# Patient Record
Sex: Female | Born: 1945 | Race: White | Hispanic: No | Marital: Married | Smoking: Never smoker
Health system: Southern US, Community
[De-identification: ages and names within clinical notes are randomized; demographics above are authoritative.]

## PROBLEM LIST (undated history)

## (undated) DIAGNOSIS — E119 Type 2 diabetes mellitus without complications: Secondary | ICD-10-CM

## (undated) DIAGNOSIS — M1990 Unspecified osteoarthritis, unspecified site: Secondary | ICD-10-CM

## (undated) DIAGNOSIS — F329 Major depressive disorder, single episode, unspecified: Secondary | ICD-10-CM

## (undated) DIAGNOSIS — R5383 Other fatigue: Secondary | ICD-10-CM

## (undated) DIAGNOSIS — M549 Dorsalgia, unspecified: Secondary | ICD-10-CM

## (undated) DIAGNOSIS — R079 Chest pain, unspecified: Secondary | ICD-10-CM

## (undated) DIAGNOSIS — D649 Anemia, unspecified: Secondary | ICD-10-CM

## (undated) DIAGNOSIS — R011 Cardiac murmur, unspecified: Secondary | ICD-10-CM

## (undated) DIAGNOSIS — E039 Hypothyroidism, unspecified: Secondary | ICD-10-CM

## (undated) DIAGNOSIS — R7303 Prediabetes: Secondary | ICD-10-CM

## (undated) DIAGNOSIS — K900 Celiac disease: Secondary | ICD-10-CM

## (undated) DIAGNOSIS — J189 Pneumonia, unspecified organism: Secondary | ICD-10-CM

## (undated) DIAGNOSIS — F32A Depression, unspecified: Secondary | ICD-10-CM

## (undated) DIAGNOSIS — K219 Gastro-esophageal reflux disease without esophagitis: Secondary | ICD-10-CM

## (undated) DIAGNOSIS — G2581 Restless legs syndrome: Secondary | ICD-10-CM

## (undated) DIAGNOSIS — I341 Nonrheumatic mitral (valve) prolapse: Secondary | ICD-10-CM

## (undated) DIAGNOSIS — I480 Paroxysmal atrial fibrillation: Secondary | ICD-10-CM

## (undated) HISTORY — PX: FRACTURE SURGERY: SHX138

## (undated) HISTORY — DX: Anemia, unspecified: D64.9

## (undated) HISTORY — PX: EYE SURGERY: SHX253

## (undated) HISTORY — DX: Unspecified osteoarthritis, unspecified site: M19.90

## (undated) HISTORY — PX: BACK SURGERY: SHX140

## (undated) HISTORY — PX: ABDOMINAL HYSTERECTOMY: SHX81

## (undated) HISTORY — DX: Cardiac murmur, unspecified: R01.1

## (undated) HISTORY — DX: Dorsalgia, unspecified: M54.9

## (undated) HISTORY — PX: CATARACT EXTRACTION W/ INTRAOCULAR LENS  IMPLANT, BILATERAL: SHX1307

## (undated) HISTORY — PX: NECK SURGERY: SHX720

---

## 2004-05-01 ENCOUNTER — Ambulatory Visit: Payer: Self-pay | Admitting: Internal Medicine

## 2005-02-05 ENCOUNTER — Ambulatory Visit: Payer: Self-pay | Admitting: Internal Medicine

## 2005-05-20 ENCOUNTER — Ambulatory Visit: Payer: Self-pay | Admitting: Ophthalmology

## 2005-05-21 ENCOUNTER — Ambulatory Visit: Payer: Self-pay | Admitting: Internal Medicine

## 2005-06-22 HISTORY — PX: FOOT SURGERY: SHX648

## 2005-10-07 ENCOUNTER — Ambulatory Visit: Payer: Self-pay | Admitting: Pain Medicine

## 2005-10-21 ENCOUNTER — Ambulatory Visit: Payer: Self-pay | Admitting: Pain Medicine

## 2005-10-22 ENCOUNTER — Ambulatory Visit: Payer: Self-pay | Admitting: Pain Medicine

## 2005-11-09 ENCOUNTER — Ambulatory Visit: Payer: Self-pay | Admitting: Physician Assistant

## 2006-03-10 ENCOUNTER — Ambulatory Visit: Payer: Self-pay | Admitting: Pain Medicine

## 2006-03-11 ENCOUNTER — Ambulatory Visit: Payer: Self-pay | Admitting: Pain Medicine

## 2006-03-15 ENCOUNTER — Ambulatory Visit: Payer: Self-pay | Admitting: Pain Medicine

## 2006-04-07 ENCOUNTER — Ambulatory Visit: Payer: Self-pay | Admitting: Pain Medicine

## 2006-04-08 ENCOUNTER — Ambulatory Visit: Payer: Self-pay | Admitting: Pain Medicine

## 2006-04-21 ENCOUNTER — Ambulatory Visit: Payer: Self-pay | Admitting: Internal Medicine

## 2006-04-22 ENCOUNTER — Ambulatory Visit: Payer: Self-pay | Admitting: Pain Medicine

## 2006-05-07 ENCOUNTER — Ambulatory Visit: Payer: Self-pay | Admitting: Physician Assistant

## 2006-05-24 ENCOUNTER — Ambulatory Visit: Payer: Self-pay | Admitting: Internal Medicine

## 2006-06-03 ENCOUNTER — Inpatient Hospital Stay (HOSPITAL_COMMUNITY): Admission: RE | Admit: 2006-06-03 | Discharge: 2006-06-04 | Payer: Self-pay | Admitting: Neurosurgery

## 2006-08-24 ENCOUNTER — Emergency Department: Payer: Self-pay | Admitting: Emergency Medicine

## 2007-06-01 ENCOUNTER — Ambulatory Visit: Payer: Self-pay | Admitting: Internal Medicine

## 2008-05-04 ENCOUNTER — Ambulatory Visit: Payer: Self-pay | Admitting: Gastroenterology

## 2008-05-16 ENCOUNTER — Ambulatory Visit: Payer: Self-pay | Admitting: Gastroenterology

## 2008-06-04 ENCOUNTER — Ambulatory Visit: Payer: Self-pay | Admitting: Internal Medicine

## 2008-09-13 ENCOUNTER — Ambulatory Visit: Payer: Self-pay | Admitting: Orthopedic Surgery

## 2008-11-22 ENCOUNTER — Inpatient Hospital Stay (HOSPITAL_COMMUNITY): Admission: RE | Admit: 2008-11-22 | Discharge: 2008-11-26 | Payer: Self-pay | Admitting: Neurosurgery

## 2009-06-05 ENCOUNTER — Ambulatory Visit: Payer: Self-pay | Admitting: Internal Medicine

## 2009-09-27 ENCOUNTER — Emergency Department: Payer: Self-pay | Admitting: Emergency Medicine

## 2010-06-09 ENCOUNTER — Ambulatory Visit: Payer: Self-pay | Admitting: Internal Medicine

## 2010-07-18 ENCOUNTER — Ambulatory Visit: Payer: Self-pay | Admitting: Gastroenterology

## 2010-07-22 LAB — PATHOLOGY REPORT

## 2010-08-08 ENCOUNTER — Ambulatory Visit: Payer: Self-pay | Admitting: Internal Medicine

## 2010-08-19 ENCOUNTER — Ambulatory Visit: Payer: Self-pay | Admitting: Internal Medicine

## 2010-08-21 ENCOUNTER — Ambulatory Visit: Payer: Self-pay | Admitting: Internal Medicine

## 2010-09-21 ENCOUNTER — Ambulatory Visit: Payer: Self-pay | Admitting: Internal Medicine

## 2010-09-29 LAB — BASIC METABOLIC PANEL
CO2: 29 mEq/L (ref 19–32)
Chloride: 97 mEq/L (ref 96–112)
Glucose, Bld: 145 mg/dL — ABNORMAL HIGH (ref 70–99)
Potassium: 3.6 mEq/L (ref 3.5–5.1)
Sodium: 133 mEq/L — ABNORMAL LOW (ref 135–145)

## 2010-09-29 LAB — CBC
Hemoglobin: 9.1 g/dL — ABNORMAL LOW (ref 12.0–15.0)
MCHC: 34.3 g/dL (ref 30.0–36.0)
MCV: 89 fL (ref 78.0–100.0)
Platelets: 313 10*3/uL (ref 150–400)
RDW: 13.3 % (ref 11.5–15.5)
RDW: 13.4 % (ref 11.5–15.5)
WBC: 7.1 10*3/uL (ref 4.0–10.5)

## 2010-09-29 LAB — DIFFERENTIAL
Lymphs Abs: 2.9 10*3/uL (ref 0.7–4.0)
Monocytes Relative: 6 % (ref 3–12)
Neutro Abs: 4.8 10*3/uL (ref 1.7–7.7)
Neutrophils Relative %: 56 % (ref 43–77)

## 2010-09-29 LAB — CULTURE, BLOOD (ROUTINE X 2): Culture: NO GROWTH

## 2010-09-30 LAB — BASIC METABOLIC PANEL
BUN: 8 mg/dL (ref 6–23)
Calcium: 8.9 mg/dL (ref 8.4–10.5)
Creatinine, Ser: 0.69 mg/dL (ref 0.4–1.2)
GFR calc non Af Amer: >60 mL/min (ref >60)
Glucose, Bld: 101 mg/dL — ABNORMAL HIGH (ref 70–99)

## 2010-09-30 LAB — TYPE AND SCREEN
ABO/RH(D): A NEG
Antibody Screen: NEGATIVE

## 2010-09-30 LAB — ABO/RH: ABO/RH(D): A NEG

## 2010-09-30 LAB — CBC
RBC: 4.15 MIL/uL (ref 3.87–5.11)
WBC: 5.9 10*3/uL (ref 4.0–10.5)

## 2010-11-04 NOTE — Op Note (Signed)
NAMEAllyne Duran NO.:  [id number]   MEDICAL RECORD NO.:  [id number]          PATIENT TYPE:  INP   LOCATION:  3020                         FACILITY:  [facility]   PHYSICIAN:  Ophelia Charter, M.D.DATE OF BIRTH:  1946-01-30   DATE OF PROCEDURE:  11/22/2008  DATE OF DISCHARGE:                               OPERATIVE REPORT   BRIEF HISTORY:  The patient is a 65 year old white female who has  suffered from back and leg pain consistent with neurogenic claudication.  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  I discussed various treatments with the patient  including surgery.  The patient has weighed the risks, benefits, and  alternatives of surgery, and decided to proceed with L3-4 and L4-5  decompression, instrumentation, and fusion.   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   PROCEDURE:  Bilateral L3 and L4 laminotomies, foraminotomies to  decompress the bilateral L3, L4, and L5 nerve roots (this work was an  addition to work, required to do the posterior lumbar fusion); L3-4 and  L4-5 posterior lumbar interbody fusion with local morselized autograft  bone and Actifuse bone graft extender; insertion of L3-4 and L4-5  interbody prosthesis (Capstone PEEK interbody prosthesis); L3-L5  posterior segmental instrumentation with legacy titanium pedicle screws,  rods; bilateral L3-4 and L4-5 posterolateral arthrodesis with local  morselized autograft bone, Actifuse bone graft extender, and Vitoss bone  graft extender.   SURGEON:  Ophelia Charter, MD   ASSISTANT:  None.   ANESTHESIA:  General endotracheal.   ESTIMATED BLOOD LOSS:  250 mL.   SPECIMENS:  None.   DRAINS:  None.   COMPLICATIONS:  None.   DESCRIPTION OF PROCEDURE:  The patient was brought to the operating room  by anesthesia team.  General endotracheal anesthesia was induced.  The  patient was turned to the prone position on Wilson frame.  The  lumbosacral region was then prepared with Betadine scrub with Betadine  solution.  Sterile drapes were applied.  I then injected the area to be  incised with Marcaine with epinephrine solution.  I used a scalpel to  make a linear midline incision over the L3-4 and L4-5 interspaces.  I  used electrocautery to perform a bilateral subperiosteal dissection  exposing the spinous processes and lamina from L2 down to the upper  sacrum.  We obtained intraoperative radiographs to confirm our location  and inserted the Versa-Trac retractor for exposure.   We began with the decompression because of the patient's spinal  stenosis, facet arthropathy, foraminal stenosis, etc.  A wide lateral  decompression was required to decompress the bilateral L3, L4, and L5  nerve roots.  This work was an addition to the work retracted to  posterior lumbar interbody fusion.  We used a high-speed drill to  perform bilaterally for L3 and L4 laminotomies.  We removed the inferior  facet of L3 and L4.  We performed wide foraminotomies about the  bilateral L3, L4, and L5 nerve roots decompressing the thecal sac as  well.  We only removed the excess ligamentum flavum and got a good  decompression.   We now turned our attention to the posterior lumbar interbody fusion.  We incised the L3-4 and L4-5 intervertebral disk with 15-blade scalpel  bilaterally.  We performed a partial intervertebral diskectomy.  We  prepared the vertebral endplates by using curettes to clear soft tissue  from the vertebral endplates.  We then used a trial spacers and I  determined to use a 12 x 26-mm Capstone PEEK interbody prosthesis.  We  prefilled this prosthesis with combination of local  morselized autograft  bone.  We obtained decompression as well as Actifuse bone graft  extender.  We inserted prosthesis into the L3-4 and L4-5 interspaces  bilaterally of course after retracting the neural structures out of  harm's way.  We got a good snug fit.  We filled the remainder of this  disk space with local autograft bone and Actifuse bone graft extender  completing the posterior lumbar interbody fusion.   We now turned our attention to the posterior spinal instrumentation  under fluoroscopic guidance.  We cannulated the bilateral L3, L4, and L5  pedicles with the bone probe.  We tapped pedicles with 5.5-mm tap.  We  probed inside the tapped pedicles to rule out cortical breaches.  We  then inserted 6.5 x 50-mm pedicle screws bilaterally at L3, L4, and L5,  we got a good bony purchase.  We then palpated along the medial aspect  of the L3, L4, and L5 pedicles and did not note any cortical breeches.  We then connected the unilateral pedicle screws with lordotic rod, we  compressed to construct, and then secured the rod in place with caps,  which we tightened appropriately completing the instrumentation.   We now turned our attention to posterolateral arthrodesis.  We used a  high-speed drill to decorticate the remainder of L3-4 and L4-L5 facets,  transverse process, pars region, etc.  We then laid a combination of  localized autograft bone and Actifuse bone graft extender, and Vitoss  bone graft extender over these decorticated posterolateral structures  completing the posterolateral arthrodesis at L3-4 and L4-5.   We then obtained hemostasis using bipolar electrocautery.  We irrigated  the wound out with bacitracin solution.  We inspected the thecal sac and  bilateral L3, L4, and L5 nerve roots and noted that they are well  decompressed.  We then removed the retractors and then reapproximated  the patient's thoracolumbar fascia with interrupted #1 Vicryl suture,  the  subcutaneous tissue with interrupted 2-0 Vicryl suture and the skin  with Steri-Strips and Benzoin.  The wound was then coated with  bacitracin ointment.  A sterile dressing was applied.  The drapes were  removed.  The patient was subsequently turned to supine position where  she was extubated by the anesthesia team and transported to the  postanesthesia care unit in stable condition.  All sponge, instrument,  and needle counts were correct at the end of the case.      Ophelia Charter, M.D.  Electronically Signed     JDJ/MEDQ  D:  11/22/2008  T:  11/23/2008  Job:  [id number]

## 2010-11-04 NOTE — Discharge Summary (Signed)
NAMEAllyne Duran NO.:  [id number]   MEDICAL RECORD NO.:  [id number]          PATIENT TYPE:  INP   LOCATION:  3020                         FACILITY:  [facility]   PHYSICIAN:  Ophelia Charter, M.D.DATE OF BIRTH:  [birth year]   DATE OF ADMISSION:  11/22/2008  DATE OF DISCHARGE:  11/26/2008                               DISCHARGE SUMMARY   BRIEF HISTORY:  The patient is a 65 year old white female who has  suffered from back and leg pain consistent with neurogenic claudication.  She was worked up with lumbar MRI which demonstrated the patient had  disk degeneration and facet arthropathy, spondylolisthesis, etc., at  L3-  4 and L4-5.  I discussed the various treatment options with the patient  including surgery.  She has weighed the risks, benefits, and  alternatives of surgery but elected to proceed with L3-4 and L4-5  decompression, instrumentation, and fusion.   For further details of this admission, please refer to typed history and  physical.   HOSPITAL COURSE:  I admitted the patient to [facility] on November 22, 2008.  On the day of admission, I performed L3-4 and L4-5  decompression, instrumentation, and fusion.  Surgery went well (for full  details of this operation, please refer to typed operative note).   POSTOPERATIVE COURSE:  The patient's postop course was remarkable only  for low-grade fever.  We worked this up with a chest x-ray.  Chest x-ray  and CBC both of which turned out normal.   She had a white count.  She had no more fevers and was discharged home  on November 26, 2008.   DISCHARGE INSTRUCTIONS:  The patient was given written discharge  instructions  and told to follow up with me in 4 weeks.   DISCHARGE PRESCRIPTIONS:  1. Percocet 10/325, #100 one p.o. q.4 h p.r.n. for pain.  2. Valium 5 mg #50 one p.o. q.6 h p.r.n. for muscle spasms.   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   POSTOPERATIVE DIAGNOSES:  L3-4 and L4-5 spondylolisthesis, disk  degeneration and stenosis, lumbar radiculopathy, and lumbago.   PROCEDURE:  L3 and L4 decompressive laminectomies to decompress  bilateral L3, 4, and 5 nerve roots; L3-4 and L4-5 posterior lumbar  interbody fusion with local morselized autograft bone and Actifuse bone  graft extender; L3-4  and L4-5 insertion of interbody prosthesis; L3 to L5 posterior segmental  instrumentation with Legacy titanium pedicle screws and rods; L3-4 and  L4-5 posterolateral arthrodesis with local morselized autograft bone and  Actifuse/Vitoss bone graft extenders.      Ophelia Charter, M.D.  Electronically Signed     JDJ/MEDQ  D:  11/26/2008  T:  11/27/2008  Job:  [id number]

## 2010-11-07 NOTE — Op Note (Signed)
NAMESelena Lesser                 ACCOUNT NO.:  [id number]   MEDICAL RECORD NO.:  [id number]          PATIENT TYPE:  INP   LOCATION:  3014                         FACILITY:  [facility]   PHYSICIAN:  Ophelia Charter, M.D.DATE OF BIRTH:  01/16/1946   DATE OF PROCEDURE:  06/03/2006  DATE OF DISCHARGE:                               OPERATIVE REPORT   PREOPERATIVE DIAGNOSES:  C5-6 herniated nucleus pulposus, C6-7  spondylosis, stenosis, cervicalgia, cervical radiculopathy.   POSTOPERATIVE DIAGNOSES:  C5-6 herniated nucleus pulposus, C6-7  spondylosis, stenosis, cervicalgia, cervical radiculopathy.   PROCEDURES:  C5-6 and C6-7 extensive anterior cervical diskectomy,  decompression, interbody autograft arthrodesis, insertion of C5-6 and C6-  7 interbody prosthesis (Capstone PEEK interbody spacers), anterior  cervical instrumentation, C5 to C7, with Codman Slimlock titanium plate  and screws.   SURGEON:  Ophelia Charter, M.D.   ASSISTANT:  Leeroy Cha, M.D.   ANESTHESIA:  General endotracheal.   ESTIMATED BLOOD LOSS:  100 cc.   SPECIMENS:  None.   DRAINS:  None.   COMPLICATIONS:  None.   BRIEF HISTORY:  The patient is a 65 year old white female, who has  suffered from neck and right arm pain.  She failed medical management  and was worked up with a cervical MRI, which demonstrated that she had  significant spondylosis and stenosis at C6-7, and a herniated disk at C5-  6.  I discussed the various treatment options with the patient,  including surgery.  She has weighed the risks, benefits and alternatives  of surgery and decided to proceed with a C5-6 and C6-7 anterior cervical  diskectomy and fusion with plating.   DESCRIPTION OF PROCEDURES:  The patient was brought to the operating  room by the anesthesia team.  General endotracheal anesthesia was  induced.  The patient remained in supine position.  A roll was placed  under her shoulders to place her neck in slight  extension.  Her anterior  cervical region was then prepared with Betadine Scrub and Betadine  Solution.  Sterile drapes were applied.  I then injected in the area to  be incised with Marcaine with epinephrine solution, and used a scalpel  to make a transverse incision in the patient's left anterior neck.  I  used the Metzenbaum scissors to divide the platysma muscle, and then to  dissect medial to the sternocleidomastoid, jugular vein and carotid  artery.  We carefully dissected down towards the anterior cervical  spine, carefully identifying the esophagus and retracting it medially.  We used the Kitner swabs to clear the soft tissue from the anterior  cervical spine, and inserted a bent spinal needle in to help expose the  intervertebral disk space.  We then obtained intraoperative radiograph  to confirm our location.   We then used elected to detach the medial border of the longus colli  muscle bilaterally from the C5-6 and C6-7 intervertebral disk space.  We  inserted a Caspar self-retaining retractor for exposure.  We began at C6-  7.  We incised the C6-7 intervertebral disk.  The disk space was quite  collapsed and  quite spondylotic.  We then inserted distraction screws at  C6 and C7, distracted the interspace, and then used a high-speed drill  to decorticate the vertebral endplates at U7-6, to drill away the  remaining C6-7 intervertebral disk, to drill away some posterior  spondylosis and to thin down the posterior longitudinal ligament.  We  then incised the ligament with an arachnoid knife and removed it with a  Kerrison punch, undercutting the vertebral endplates at L4-6,  decompressing the thecal sac.  We then performed foraminotomies about  the bilateral C7 nerve roots, completing the decompression at this  level.  There was considerable spondylosis at this level.   We then repeated this procedure in an analogous fashion at C5-6,  decompressing the thecal sac and the  bilateral C6 nerve root.  Of note,  at this level, we found a large herniated disk on the right.   Having completed the decompression at C5-6 and C6-7, we now turned  attention to arthrodesis.  We used trial spacers to determine the size  of the interbody prosthesis.  We used a 5-mm, small Alphatec PEEK spacer  at C6-7, and a 6-mm PEEK spacer at C5-6.  We filled these spacers with a  combination of local autograft bone that we obtained during the  decompression, as well as bone graft extender.  We then inserted the  prosthesis into the distract the C5-6 and C6-7 interspaces, and after  removing the distraction screws, there was a good snug fit of the  prostheses at both levels.   We now turned our attention to the anterior spinal instrumentation.  We  used a high-speed drill to remove some ventral spondylosis from the  vertebral endplates at T0-3 and T4-6 so that the plate would lie down  flat.  We then selected the appropriate length Codman Slimlock anterior  cervical plate and laid it along the anterior aspect of the vertebral  bodies from C5 to C7.  We drilled two 12-mm holes at C5, 6 and 7, and  then we secured the plate to the vertebral bodies by placing two 12-mm  self-tapping screws at C5, 6 and 7.  We then obtained intraoperative  radiograph that demonstrated good position of plate, screws and  prosthesis from C5 to C7.  We, therefore, secured these screws to the  plate by locking each cam.  We then obtained hemostasis using bipolar  cautery.  We irrigated the wound out with Bacitracin solution and  removed the McCullough retractor.  We then inspected the esophagus for  any damage and there was none apparent.  We then reapproximated the  patient's platysma muscle with interrupted 3-0 Vicryl suture, the  subcutaneous tissue with interrupted 3-0 Vicryl suture, and the skin  with Steri-Strips and benzoin.  The wound was then coated with Bacitracin ointment and a sterile dressing  applied.  The drapes were  removed, and the patient was subsequently extubated by the anesthesia  team and transported to the postanesthesia care unit in stable  condition.  All sponge, instrument and needle counts were correct at the  end of this case.      Ophelia Charter, M.D.  Electronically Signed     JDJ/MEDQ  D:  06/03/2006  T:  06/04/2006  Job:  [id number]   cc:   Leeroy Cha, M.D.

## 2010-11-10 ENCOUNTER — Ambulatory Visit: Payer: Self-pay | Admitting: Neurosurgery

## 2011-03-30 ENCOUNTER — Other Ambulatory Visit (HOSPITAL_COMMUNITY): Payer: Self-pay | Admitting: Neurosurgery

## 2011-03-30 DIAGNOSIS — M5136 Other intervertebral disc degeneration, lumbar region: Secondary | ICD-10-CM

## 2011-05-01 ENCOUNTER — Ambulatory Visit (HOSPITAL_COMMUNITY)
Admission: RE | Admit: 2011-05-01 | Discharge: 2011-05-01 | Disposition: A | Discharge status: Home / Self Care | Payer: Medicare Other | Source: Ambulatory Visit | Attending: Neurosurgery | Admitting: Neurosurgery

## 2011-05-01 ENCOUNTER — Other Ambulatory Visit (HOSPITAL_COMMUNITY): Payer: Self-pay | Admitting: Radiology

## 2011-05-01 DIAGNOSIS — M519 Unspecified thoracic, thoracolumbar and lumbosacral intervertebral disc disorder: Secondary | ICD-10-CM | POA: Insufficient documentation

## 2011-05-01 DIAGNOSIS — M5136 Other intervertebral disc degeneration, lumbar region: Secondary | ICD-10-CM

## 2011-05-01 DIAGNOSIS — Z981 Arthrodesis status: Secondary | ICD-10-CM | POA: Insufficient documentation

## 2011-05-01 DIAGNOSIS — M545 Low back pain, unspecified: Secondary | ICD-10-CM | POA: Insufficient documentation

## 2011-05-01 DIAGNOSIS — M48061 Spinal stenosis, lumbar region without neurogenic claudication: Secondary | ICD-10-CM | POA: Insufficient documentation

## 2011-05-01 MED ORDER — OXYCODONE-ACETAMINOPHEN 5-325 MG PO TABS
1.0000 | ORAL_TABLET | ORAL | Status: DC | PRN
  Administered 2011-05-01: 2 via ORAL

## 2011-05-01 MED ORDER — MORPHINE SULFATE 4 MG/ML IJ SOLN: 4.0000 mg | INTRAMUSCULAR | Status: DC | PRN

## 2011-05-01 MED ORDER — ONDANSETRON HCL 4 MG/2ML IJ SOLN: 4.0000 mg | Freq: Four times a day (QID) | INTRAMUSCULAR | Status: DC | PRN

## 2011-05-01 MED ORDER — IOHEXOL 180 MG/ML  SOLN
20.0000 mL | Freq: Once | INTRAMUSCULAR | Status: AC | PRN
  Administered 2011-05-01: 20 mL via INTRAVENOUS

## 2011-05-01 MED ORDER — OXYCODONE-ACETAMINOPHEN 5-325 MG PO TABS
ORAL_TABLET | ORAL | Status: AC
  Administered 2011-05-01: 2 via ORAL
  Filled 2011-05-01: qty 2

## 2011-05-01 MED ORDER — DIAZEPAM 5 MG PO TABS
10.0000 mg | ORAL_TABLET | Freq: Once | ORAL | Status: AC
  Administered 2011-05-01: 10 mg via ORAL

## 2011-05-01 NOTE — H&P (Signed)
Brief history: The patient is a 65 year old white female who I performed a L3-4 and 45 decompression instrumentation and fusion. Patient has developed recurrent back pain. Discussed the treatment options and she decided proceed with a lumbar myelo CT.  Past medical history, past surgical history, family history, social history: Per office notes  Physical exam Gen. a pleasant healthy-appearing 65 year old white female  Neurologic exam the patient is alert and oriented her motor strength is normal  Assessment and plan: Lumbago lumbar radiculopathy. As above the patient was to proceed with a lumbar mild CT.

## 2011-05-01 NOTE — Progress Notes (Signed)
Procedure: Lumbar myelogram  Surgeon: Dr. Earle Gell  Injection level: L5-S1  Description of procedure: Spinal needle was introduced at L5-S1 18 cc of Omnipaque 180 was injected into the subarachnoid space under fluoroscopic guidance without difficulty.  Occasions: None

## 2011-07-02 ENCOUNTER — Ambulatory Visit: Payer: Self-pay | Admitting: Internal Medicine

## 2011-10-12 ENCOUNTER — Ambulatory Visit: Payer: Self-pay | Admitting: General Practice

## 2011-10-12 LAB — URINALYSIS, COMPLETE
Bacteria: NONE SEEN
Bilirubin,UR: NEGATIVE
Glucose,UR: 50 mg/dL (ref 0–75)
Ketone: NEGATIVE
Nitrite: NEGATIVE
Ph: 5 (ref 4.5–8.0)
Specific Gravity: 1.024 (ref 1.003–1.030)
Squamous Epithelial: 9
WBC UR: 5 /HPF (ref 0–5)

## 2011-10-12 LAB — BASIC METABOLIC PANEL
Anion Gap: 6 — ABNORMAL LOW (ref 7–16)
Creatinine: 0.73 mg/dL (ref 0.60–1.30)
EGFR (African American): >60
EGFR (Non-African Amer.): >60
Glucose: 87 mg/dL (ref 65–99)
Osmolality: 276 (ref 275–301)
Potassium: 3.9 mmol/L (ref 3.5–5.1)

## 2011-10-12 LAB — CBC
HCT: 33.2 % — ABNORMAL LOW (ref 35.0–47.0)
HGB: 10.4 g/dL — ABNORMAL LOW (ref 12.0–16.0)
MCH: 24.6 pg — ABNORMAL LOW (ref 26.0–34.0)
MCHC: 31.3 g/dL — ABNORMAL LOW (ref 32.0–36.0)
RBC: 4.24 10*6/uL (ref 3.80–5.20)
RDW: 16.5 % — ABNORMAL HIGH (ref 11.5–14.5)

## 2011-10-12 LAB — MRSA PCR SCREENING

## 2011-10-12 LAB — SEDIMENTATION RATE: Erythrocyte Sed Rate: 62 mm/hr — ABNORMAL HIGH (ref 0–30)

## 2011-10-12 LAB — PROTIME-INR
INR: 0.9
Prothrombin Time: 12.5 secs (ref 11.5–14.7)

## 2011-10-13 LAB — URINE CULTURE

## 2011-10-21 ENCOUNTER — Inpatient Hospital Stay: Payer: Self-pay | Admitting: General Practice

## 2011-10-22 LAB — BASIC METABOLIC PANEL
Anion Gap: 8 (ref 7–16)
BUN: 6 mg/dL — ABNORMAL LOW (ref 7–18)
Calcium, Total: 8.4 mg/dL — ABNORMAL LOW (ref 8.5–10.1)
Co2: 26 mmol/L (ref 21–32)
EGFR (African American): >60
EGFR (Non-African Amer.): >60
Osmolality: 271 (ref 275–301)
Sodium: 136 mmol/L (ref 136–145)

## 2011-10-22 LAB — HEMOGLOBIN: HGB: 9.1 g/dL — ABNORMAL LOW (ref 12.0–16.0)

## 2011-10-23 LAB — PLATELET COUNT: Platelet: 282 10*3/uL (ref 150–440)

## 2011-10-23 LAB — BASIC METABOLIC PANEL
BUN: 4 mg/dL — ABNORMAL LOW (ref 7–18)
Calcium, Total: 8.5 mg/dL (ref 8.5–10.1)
Co2: 26 mmol/L (ref 21–32)
EGFR (African American): >60
EGFR (Non-African Amer.): >60
Glucose: 121 mg/dL — ABNORMAL HIGH (ref 65–99)
Osmolality: 278 (ref 275–301)
Potassium: 3.3 mmol/L — ABNORMAL LOW (ref 3.5–5.1)

## 2011-10-23 LAB — HEMOGLOBIN: HGB: 8.6 g/dL — ABNORMAL LOW (ref 12.0–16.0)

## 2011-10-24 LAB — BASIC METABOLIC PANEL
Anion Gap: 4 — ABNORMAL LOW (ref 7–16)
BUN: 6 mg/dL — ABNORMAL LOW (ref 7–18)
Calcium, Total: 8.5 mg/dL (ref 8.5–10.1)
Chloride: 107 mmol/L (ref 98–107)
Co2: 30 mmol/L (ref 21–32)
EGFR (African American): >60
Potassium: 4 mmol/L (ref 3.5–5.1)

## 2012-01-18 ENCOUNTER — Ambulatory Visit: Payer: Self-pay | Admitting: General Practice

## 2012-01-18 LAB — CBC
HCT: 32 % — ABNORMAL LOW (ref 35.0–47.0)
MCH: 22.7 pg — ABNORMAL LOW (ref 26.0–34.0)
MCHC: 31.8 g/dL — ABNORMAL LOW (ref 32.0–36.0)
MCV: 71 fL — ABNORMAL LOW (ref 80–100)

## 2012-01-18 LAB — URINALYSIS, COMPLETE
Bacteria: NONE SEEN
Blood: NEGATIVE
Glucose,UR: >=500 mg/dL (ref 0–75)
Nitrite: NEGATIVE
Specific Gravity: 1.022 (ref 1.003–1.030)

## 2012-01-18 LAB — BASIC METABOLIC PANEL
Anion Gap: 5 — ABNORMAL LOW (ref 7–16)
BUN: 12 mg/dL (ref 7–18)
Creatinine: 0.7 mg/dL (ref 0.60–1.30)
EGFR (African American): >60
Glucose: 77 mg/dL (ref 65–99)
Osmolality: 278 (ref 275–301)

## 2012-01-18 LAB — MRSA PCR SCREENING

## 2012-01-18 LAB — APTT: Activated PTT: 24.8 secs (ref 23.6–35.9)

## 2012-02-01 ENCOUNTER — Inpatient Hospital Stay: Payer: Self-pay

## 2012-02-02 LAB — HEMOGLOBIN: HGB: 7.4 g/dL — ABNORMAL LOW (ref 12.0–16.0)

## 2012-02-02 LAB — BASIC METABOLIC PANEL
Anion Gap: 8 (ref 7–16)
BUN: 6 mg/dL — ABNORMAL LOW (ref 7–18)
Chloride: 102 mmol/L (ref 98–107)
Co2: 27 mmol/L (ref 21–32)
Osmolality: 273 (ref 275–301)

## 2012-02-03 LAB — BASIC METABOLIC PANEL
Anion Gap: 6 — ABNORMAL LOW (ref 7–16)
BUN: 4 mg/dL — ABNORMAL LOW (ref 7–18)
Creatinine: 0.47 mg/dL — ABNORMAL LOW (ref 0.60–1.30)
EGFR (Non-African Amer.): >60
Glucose: 128 mg/dL — ABNORMAL HIGH (ref 65–99)
Osmolality: 267 (ref 275–301)

## 2012-02-04 LAB — HEMOGLOBIN: HGB: 8.2 g/dL — ABNORMAL LOW (ref 12.0–16.0)

## 2012-02-04 LAB — PLATELET COUNT: Platelet: 354 10*3/uL (ref 150–440)

## 2012-06-22 HISTORY — PX: JOINT REPLACEMENT: SHX530

## 2013-01-20 DEATH — deceased

## 2013-04-06 ENCOUNTER — Ambulatory Visit: Payer: Self-pay | Admitting: Family Medicine

## 2013-05-31 ENCOUNTER — Ambulatory Visit: Payer: Self-pay | Admitting: Ophthalmology

## 2013-06-12 ENCOUNTER — Ambulatory Visit: Payer: Self-pay | Admitting: Ophthalmology

## 2014-06-22 HISTORY — PX: HIP FRACTURE SURGERY: SHX118

## 2014-08-06 ENCOUNTER — Inpatient Hospital Stay: Payer: Self-pay | Admitting: Internal Medicine

## 2014-08-07 DIAGNOSIS — S52133A Displaced fracture of neck of unspecified radius, initial encounter for closed fracture: Secondary | ICD-10-CM | POA: Insufficient documentation

## 2014-08-24 DIAGNOSIS — S52133A Displaced fracture of neck of unspecified radius, initial encounter for closed fracture: Secondary | ICD-10-CM | POA: Insufficient documentation

## 2014-08-24 DIAGNOSIS — S72043A Displaced fracture of base of neck of unspecified femur, initial encounter for closed fracture: Secondary | ICD-10-CM | POA: Insufficient documentation

## 2014-10-09 NOTE — Discharge Summary (Signed)
PATIENT NAME:  Valerie Duran, Valerie Duran MR#:  [id number] DATE OF BIRTH:  1945-09-15  DATE OF ADMISSION:  02/01/2012 DATE OF DISCHARGE:  02/04/2012  ADMITTING DIAGNOSIS: Degenerative arthrosis of the right knee.   DISCHARGE DIAGNOSIS: Degenerative arthrosis of the right knee.   HISTORY: The patient is a pleasant 69 year old who has been followed at [facility] for progression of right knee pain. She had had pain to bilateral knees, but had undergone a left total knee arthroplasty on 10/21/2011 and has done extremely well with this. However, she has continued to have discomfort to the right. She had localized most of the pain along the medial aspect of the knee. Her pain was aggravated with weight-bearing activities. She has been having difficulty going up and down steps. Occasionally she was reporting some swelling. At the time of surgery she was not using any ambulatory aid. She had not seen any significant improvement in her condition despite activity modifications, cortisone injections, as well as anti-inflammatory medication. Her pain had progressed to the point that it was significantly interfering with her activities of daily living. X-rays taken in the [facility] orthopedic department showed bone-on-bone to the medial compartment space with significant osteophyte formation both to the medial and lateral femoral condyles. After discussion of the risks and benefits of surgical intervention, the patient expressed her understanding of the risks and benefits and agreed with plans for surgical intervention.   PROCEDURE: Right total knee arthroplasty using computer-assisted navigation.   ANESTHESIA: Femoral nerve block and spinal.   SOFT TISSUE RELEASE: Anterior cruciate ligament, posterior cruciate ligament, deep medial collateral ligaments, as well as the patellofemoral ligament.   IMPLANTS UTILIZED: DePuy PFC Sigma size three posterior stabilized femoral component (cemented), size 2.5 MBT  tibial component (cemented), 35-mm three-pegged oval dome patella (cemented), and a 10-mm stabilized rotating platform polyethylene insert.   HOSPITAL COURSE: The patient tolerated the procedure very well. She had no complications. She was then taken to the PAC-U where she was stabilized and then transferred to the orthopedic floor. She began receiving anticoagulation therapy of Lovenox 30 mg subcutaneous every 12 hours per anesthesia and pharmacy protocol. She was fitted with TED stockings bilaterally. These were allowed to be removed one hour per eight-hour shift. The right one was applied on day two following removal of the Hemovac and dressing change. The patient was also fitted with the AV-I compression foot pumps bilaterally set at 80 mmHg. Her calves have been nontender. There has been no evidence of any deep venous thromboses to the lower extremity.   The patient has denied any chest pain or shortness of breath. Vital signs have been stable. She has been afebrile. Hemodynamically she did receive 1 unit of  transfusion even though she had the Autovac transfusion given the first six hours postoperatively. Day one following her surgery her hemoglobin was 7.1 and  later that day was 7.7. The following morning on day two she was 7. Subsequently, one unit of packed RBCs was administered and hemoglobin upon discharge was 8.2 and she was asymptomatic.   Physical therapy was initiated on day one for activities of daily living and assistive devices. She has progressed very nicely. Upon being discharged, she was ambulating greater than 200 feet. She was independent with bed to chair transfers. She was able to go up and down four sets of steps. Occupational therapy was also initiated on day one for activities of daily living and assistive devices.   This has been an unremarkable hospital course  except for pain issues. Initially the patient was experiencing a lot of pain and was noted to have some anxiety.  Come  to find out the patient had discontinued her Cymbalta without telling anybody secondary to financial reasons. She was placed on Xanax, which did seem to help her. She also was placed back on the Cymbalta while in the hospital. She was advised to contact Dr. Melina Modena to help her with her present medications and see if he could find one that she could afford.  However, upon leaving the hospital the patient was resting well and did not appear to have any anxiety episodes. Her pain was an issue at first and OxyContin was added, and this seemed to help her as well.   The patient is being discharged to home in improved stable condition.   DISCHARGE INSTRUCTIONS: 1. She is to continue with TED stockings bilaterally. These are to be worn during the day but may be removed at night.  2. She is to continue using a walker until cleared by physical cleared to go to a quad cane.  3. She will receive Home Health physical therapy.  4. She is to continue using Polar Care to the surgical leg maintaining a temperature of 40 to 50 degrees Fahrenheit.  5. She is placed on a regular diet.  6. She was instructed on wound care.  7. She has a follow-up appointment on the 27th. She is to call the clinic sooner if any temperatures of 101.5 or greater or excessive bleeding.  8. She is to resume her regular medication that she was on prior to admission. She was given a prescription for Lovenox 40 mg, one injection subcutaneously daily for 14 days, then discontinue and begin taking one 81-mg enteric-coated aspirin. Prescription for OxyContin 10 mg, dispense 20, 1 tablet q.12 hours for 10 days. Oxycodone 5 to 10 mg every 4 to 6 hours p.r.n. for pain, dispensed 80, Ultram 50 to 100 mg every 4 to 6 hours p.r.n. for pain, dispensing 60 with one refill.   PAST MEDICAL HISTORY:  1. Chickenpox.  2. Cataracts.  3. Osteoarthritis.  4. Gastroesophageal reflux disease.  5. Depression. 6. Rheumatic heart disease with cardiac murmur and  rheumatic fever as a child. 7. Degenerative disk disease.  8. Mitral valve prolapse. 9. Celiac disease.    ____________________________ Vance Peper, PA [id number] D: 02/04/2012 07:03:07 ET T: 02/05/2012 10:08:36 ET JOB#: [id number]  cc: Vance Peper, PA, <Dictator> JON WOLFE PA ELECTRONICALLY SIGNED 02/06/2012 21:34

## 2014-10-09 NOTE — Op Note (Signed)
PATIENT NAME:  Valerie Duran, Valerie Duran MR#:  [id number] DATE OF BIRTH:  1945/08/02  DATE OF PROCEDURE:  02/01/2012  PREOPERATIVE DIAGNOSIS: Degenerative arthrosis of right knee.   POSTOPERATIVE DIAGNOSIS: Degenerative arthrosis of right knee.  PROCEDURE PERFORMED: Right total knee arthroplasty using computer-assisted navigation.   SURGEON: Laurice Record. Holley Bouche., MD  ASSISTANT: Vance Peper, PA-C (required to maintain retraction throughout the procedure)   ANESTHESIA: Femoral nerve block and spinal.   ESTIMATED BLOOD LOSS: 100 mL.   FLUIDS REPLACED: 1250 mL of crystalloid.   TOURNIQUET TIME: 72 minutes.   DRAINS: Two medium drains to reinfusion system.   SOFT TISSUE RELEASES: Anterior cruciate ligament, posterior cruciate ligament, deep medial collateral ligament, patellofemoral ligament.   IMPLANT UTILIZED: DePuy PFC Sigma size 3 posterior stabilized femoral component (cemented), size 2.5 MBT tibial component (cemented), 35 mm three peg oval dome patella (cemented), and a 10 mm stabilized rotating platform polyethylene insert.   INDICATIONS FOR SURGERY: The patient is a 69 year old female who has been seen for complaints of progressive right knee pain. X-rays demonstrated significant degenerative changes in tricompartmental fashion with relative varus deformity. After discussion of the risks and benefits of surgical intervention, the patient expressed her understanding of the risks and benefits and agreed with plans for surgical intervention.   PROCEDURE IN DETAIL: Patient was brought into the Operating Room and, after adequate femoral nerve block and spinal anesthesia was achieved, a tourniquet was placed on the patient's upper right thigh. Patient's right knee and leg were cleaned and prepped with alcohol and DuraPrep, draped in the usual sterile fashion. A "timeout" was performed as per usual protocol. The right lower extremity was exsanguinated using Esmarch, and tourniquet was inflated to  300 mmHg. An anterior longitudinal incision was made followed by a standard mid vastus approach. A small effusion was evacuated. Deep fibers of the medial collateral ligament were elevated in subperiosteal fashion off the medial flare of the tibia so as to maintain a continuous soft tissue sleeve. Patella was subluxed laterally and patellofemoral ligament was incised. Inspection of the knee demonstrated significant degenerative changes in tricompartmental fashion with eburnated bone noted to the medial compartment and significant wear and softening of the articular cartilage to the lateral and patellofemoral articulations. Anterior and posterior cruciate ligaments were excised. Prominent osteophytes were debrided using rongeur. Two 4.0 mm Schanz pins were inserted into the femur and into the tibia for attachment of the ray of trackers used for computer-assisted navigation. Hip center was identified using circumduction technique. Distal landmarks were mapped using computer. Distal femur and proximal tibia were mapped using computer. Distal femoral cutting guide was positioned using computer-assisted navigation so as to achieve 5 degrees distal valgus cut. Cut was performed and verified using computer. Distal femur was sized and it was felt that a size 3 femoral component was appropriate. Size 3 cutting guide was positioned and the anterior cut was performed and verified using computer. This was followed by completion of the posterior and chamfer cuts. Femoral cutting guide for central box was then positioned and central box cut was performed.   Attention was then directed to the proximal tibia. Medial and lateral menisci were excised. The extramedullary tibial cutting guide was positioned using computer-assisted navigation so as to achieve 0 degrees varus valgus alignment and 0 degrees posterior slope. Cut was performed and verified using the computer. Proximal tibia was sized and it was felt that a size 2.5 tibial  tray was appropriate. Tibial and femoral trials were inserted  followed by insertion of a 10 mm polyethylene trial. Excellent mediolateral soft tissue balancing was appreciated both in full extension and in 90 degrees of flexion. Finally, the patella was cut and prepared so as to accommodate a 35 mm three peg oval dome patella. Patellar trial was placed and the knee was placed through a range of motion with excellent patellar tracking appreciated.   Femoral trial was removed. Central post hole for the tibial component was reamed followed by insertion of a keel punch. Tibial trials were then removed. Cut surfaces of bone were irrigated with copious amounts of normal saline with antibiotic solution using pulsatile lavage and then suctioned dry. Polymethyl methacrylate cement was prepared in the usual fashion using a vacuum mixer. Cement was applied to the cut surface of the proximal tibia as well as along the undersurface of a size 2.5 MBT tibial component. Tibial component was positioned and impacted into place. Excess cement was removed using freer elevators. Cement was then applied to the cut surface of the femur as well as along the posterior flanges of a size 3 posterior stabilized femoral component. Femoral component was positioned and impacted into place. Excess cement was removed using freer elevators. A 10 mm polyethylene trial was inserted and the knee was brought into full extension with steady axial compression applied. Finally, cement was applied to the backside of a 35 mm three peg oval dome patella and patellar component was positioned and patellar clamp applied. Excess cement was removed using freer elevators.   After adequate curing of cement, tourniquet was deflated after total tourniquet time of 72 minutes. Hemostasis was achieved using electrocautery. The knee was irrigated with copious amounts of normal saline with antibiotic solution using pulsatile lavage and then suctioned dry. The knee was  inspected for any residual cement debris. 30 mL of 0.25% Marcaine with epinephrine was injected along the posterior capsule. A 10 mm stabilized rotating platform polyethylene insert was inserted and the knee was placed through a range of motion. Excellent patellar tracking was appreciated and excellent mediolateral soft tissue balancing was appreciated both in extension and in 90 degrees of flexion. Two medium drains were placed in the wound bed and brought out through a separate stab incision to be attached to reinfusion system. The medial parapatellar portion of the incision was reapproximated using #1 Vicryl. Subcutaneous tissue was approximated in layers using first #0 Vicryl followed by 2-0 Vicryl. The skin was closed with skin staples. Sterile dressing was applied.   Patient tolerated procedure well. She was transported to the recovery room in stable condition.   ____________________________ Laurice Record. Holley Bouche., MD jph:cms D: 02/01/2012 10:58:01 ET T: 02/01/2012 12:56:57 ET JOB#: [id number]  cc: Jeneen Rinks P. Holley Bouche., MD, <Dictator> JAMES P Holley Bouche MD ELECTRONICALLY SIGNED 02/02/2012 1:07

## 2014-10-13 NOTE — Op Note (Signed)
PATIENT NAME:  Valerie Duran, Valerie Duran MR#:  [id number] DATE OF BIRTH:  [birth year]  DATE OF PROCEDURE:  06/12/2013  PREOPERATIVE DIAGNOSIS: Visually significant cataract of the right eye.   POSTOPERATIVE DIAGNOSIS: Visually significant cataract of the right eye.   OPERATIVE PROCEDURE: Cataract extraction by phacoemulsification with implant of intraocular lens to right eye.   SURGEON: Birder Robson, MD.   ANESTHESIA:  1. Managed anesthesia care.  2. Topical tetracaine drops followed by 2% Xylocaine jelly applied in the preoperative holding area.   COMPLICATIONS: None.   TECHNIQUE:  Stop and chop.    DESCRIPTION OF PROCEDURE: The patient was examined and consented in the preoperative holding area where the aforementioned topical anesthesia was applied to the right eye and then brought back to the Operating Room where the right eye was prepped and draped in the usual sterile ophthalmic fashion and a lid speculum was placed. A paracentesis was created with the side port blade and the anterior chamber was filled with viscoelastic. A near clear corneal incision was performed with the steel keratome. A continuous curvilinear capsulorrhexis was performed with a cystotome followed by the capsulorrhexis forceps. Hydrodissection and hydrodelineation were carried out with BSS on a blunt cannula. The lens was removed in a stop and chop technique and the remaining cortical material was removed with the irrigation-aspiration handpiece. The capsular bag was inflated with viscoelastic and the Alcon SN60WF 21.0-diopter lens, serial number [id number] was placed in the capsular bag without complication. The remaining viscoelastic was removed from the eye with the irrigation-aspiration handpiece. The wounds were hydrated. The anterior chamber was flushed with Miostat and the eye was inflated to physiologic pressure. 0.1 mL of cefuroxime concentration 10 mg/mL was placed in the anterior chamber. The wounds were found  to be water tight. The eye was dressed with Vigamox. The patient was given protective glasses to wear throughout the day and a shield with which to sleep tonight. The patient was also given drops with which to begin a drop regimen today and will follow-up with me in one day.    ____________________________ Livingston Diones. Arinze Rivadeneira, MD wlp:np D: 06/12/2013 16:59:50 ET T: 06/12/2013 23:22:54 ET JOB#: [id number]  cc: Shenaya Lebo L. Akemi Overholser, MD, <Dictator> Livingston Diones Mariell Nester MD ELECTRONICALLY SIGNED 06/29/2013 17:18

## 2014-10-14 NOTE — Op Note (Signed)
PATIENT NAME:  Valerie Duran, Valerie Duran MR#:  [id number] DATE OF BIRTH:  [birth year]  DATE OF PROCEDURE:  10/21/2011  PREOPERATIVE DIAGNOSIS: Degenerative arthrosis of the left knee.   POSTOPERATIVE DIAGNOSIS: Degenerative arthrosis of the left knee.   PROCEDURE PERFORMED: Left total knee arthroplasty using computer-assisted navigation.   SURGEON: Laurice Record. Hooten, M.D.   ASSISTANT: Vance Peper, PA-C (required to maintain retraction throughout the procedure)   ANESTHESIA: Femoral nerve block and spinal.   ESTIMATED BLOOD LOSS: 150 mL.   FLUIDS REPLACED: 1900 mL of crystalloid.   TOURNIQUET TIME: 77 minutes.   DRAINS: Two medium drains to reinfusion system.   SOFT TISSUE RELEASES: Anterior cruciate ligament, posterior cruciate ligament, deep medial collateral ligament, patellofemoral ligament.   IMPLANTS UTILIZED: DePuy PFC Sigma size 3 posterior stabilized femoral component (cemented), size 2.5 MBT tibial component (cemented), 38 mm three peg oval dome patella (cemented), and a 10 mm stabilized rotating platform polyethylene insert.   INDICATIONS FOR SURGERY: The patient is a 69 year old female who has been seen for complaints of severe progressive left knee pain. She did not see any significant improvement despite conservative nonsurgical intervention. X-rays demonstrated significant degenerative changes in tricompartmental fashion. After discussion of the risks and benefits of surgical intervention, the patient expressed her understanding of the risks, benefits, and agreed with plans for surgical intervention.   PROCEDURE IN DETAIL: The patient was brought to the operating room and, after adequate femoral nerve block and spinal anesthesia was achieved, a tourniquet was placed on the patient's upper left thigh. The patient's left knee and leg were cleaned and prepped with alcohol and DuraPrep and draped in the usual sterile fashion. A "time out" was performed as per usual protocol. The left  lower extremity was exsanguinated using an Esmarch, and the tourniquet was inflated to 300 mmHg. An anterior longitudinal incision was made followed by a standard mid vastus approach. A moderate effusion was evacuated. Deep fibers of the medial collateral ligament were elevated in subperiosteal fashion off the medial flare of the tibia so as to maintain a continuous soft tissue sleeve. The patella was subluxed laterally and the patellofemoral ligament was incised. Inspection of the knee demonstrated significant degenerative changes in a tricompartmental fashion. Prominent osteophytes were debrided using a rongeur. Anterior and posterior cruciate ligaments were excised. Two 4.0 mm Schanz pins were inserted into the femur and into the tibia for attachment of the ray of spheres used for computer-assisted navigation. Hip center was identified using a circumduction technique. Distal landmarks were mapped using the computer. The distal femur and proximal tibia were mapped using the computer. Distal femoral cutting guide was positioned using computer-assisted navigation so as to achieve a 5 degree distal valgus cut. Cut was performed and verified using the computer. The distal femur was sized and it was felt that a size 3 femoral component was appropriate. Size 3 cutting guide was positioned using computer-assisted navigation and the anterior cut was performed and verified using the computer. This was followed by completion of the posterior and chamfer cuts. Femoral cutting guide for the central box was then positioned and the central box cut was performed.   Attention was then directed to the proximal tibia. Medial and lateral menisci were excised. The extramedullary tibial cutting guide was positioned using computer-assisted navigation so as to achieve 0 degree varus valgus alignment and 0 degree posterior slope. Cut was performed and verified using the computer. The proximal tibia was sized and it was felt that a size  2.5 tibial tray was appropriate. Tibial and femoral trials were inserted followed by insertion of a 10 mm polyethylene insert. This allowed for excellent mediolateral soft tissue balancing both in full extension and in 90 degrees of flexion. Finally, the patella was cut and prepared so as to accommodate a 38 mm three peg oval dome patella. Patellar trial was placed and the knee was placed through a range of motion with excellent patellar tracking appreciated.   Femoral trial was removed. Central post hole for the tibial component was reamed followed by insertion of a keel punch. Tibial trials were then removed. The cut surfaces of bone were irrigated with copious amounts of normal saline with antibiotic solution using pulsatile lavage and then suctioned dry. Polymethyl methacrylate cement was prepared in the usual fashion using a vacuum mixer. Cement was applied to the cut surface of the proximal tibia as well as along the undersurface of a size 2.5 MBT tibial component. The tibial component was positioned and impacted into place. Excess cement was removed using freer elevators. Cement was then applied to the cut surface of the femur as well as along the posterior flanges of size 3 posterior stabilized femoral component. Femoral component was positioned and impacted in place. Excess cement was removed using freer elevators. A 10 mm polyethylene trial was inserted and the knee was brought in full extension with steady axial compression applied. Finally, cement was applied to the backside of a 38 mm three peg oval dome patella and the patellar component was positioned and patellar clamp applied. Excess cement was removed using freer elevators.   After adequate curing of cement, the tourniquet was deflated after total tourniquet time of 77 minutes. Hemostasis was achieved using electrocautery. The knee was irrigated with copious amounts of normal saline with antibiotic solution using pulsatile lavage and then  suctioned dry. The knee was inspected for any residual cement debris. 30 mL of 0.25% Marcaine with epinephrine was injected along the posterior capsule. A 10 mm stabilized rotating platform polyethylene insert was inserted and the knee was placed through a range of motion. Excellent patellar tracking was appreciated and excellent mediolateral soft tissue balancing was appreciated both in full extension and in 90 degrees of flexion. Two medium drains were placed in the wound bed and brought out through a separate stab incision to be attached to a reinfusion system. The medial parapatellar portion of the incision was reapproximated using interrupted sutures of #1 Vicryl. The subcutaneous tissue was approximated in layers using first #0 Vicryl followed by #2-0 Vicryl. Skin was closed with skin staples. A sterile dressing was applied.   The patient tolerated the procedure well. She was transported to the recovery room in stable condition. ____________________________ Laurice Record. Holley Bouche., MD jph:slb D: 10/21/2011 19:01:28 ET T: 10/22/2011 10:32:31 ET JOB#: [id number]  cc: Jeneen Rinks P. Holley Bouche., MD, <Dictator> Laurice Record Holley Bouche MD ELECTRONICALLY SIGNED 10/23/2011 18:07

## 2014-10-14 NOTE — Discharge Summary (Signed)
PATIENT NAME:  Valerie Duran, Valerie Duran MR#:  [id number] DATE OF BIRTH:  06/16/1946  DATE OF ADMISSION:  10/21/2011 DATE OF DISCHARGE:  10/24/2011  ADMITTING DIAGNOSIS: Degenerative arthrosis of the left knee.   DISCHARGE DIAGNOSIS: Degenerative arthrosis of the left knee.   HISTORY: The patient is a pleasant 69 year old who has been followed at [facility] for progression of left knee pain. She reported a long history of bilateral knee pain. At the time of surgery, her left knee was noted to be more symptomatic than the right. She had localized most of the pain along the medial aspect of the knee. Her pain was noted to be aggravated with weight-bearing activities. She was having difficulty with stair ambulation. The patient on occasion had reported swelling of the knee. At the time of surgery, she was not using any type of ambulatory aid. She had not seen any significant improvement despite the use of anti-inflammatory medications as well as intra-articular cortisone injections. The pain had progressed to the point that it was significantly interfering with her activities of daily living. X-rays taken in the clinic showed narrowing of the medial cartilage space with varus alignment being noted. She was noted to have osteophyte as well as subchondral sclerosis. After discussion of the risks and benefits of surgical intervention, the patient expressed her understanding of the risks and benefits and agreed with plans for surgical intervention.   PROCEDURE: Left total knee arthroplasty using computer-assisted navigation. Date of surgery 10/21/2011.   ANESTHESIA: Femoral nerve block with spinal.   SOFT TISSUE RELEASE: Anterior cruciate ligament, posterior cruciate ligament, and deep medial collateral ligaments, as well as the patellofemoral ligament.   IMPLANTS UTILIZED: DePuy PFC Sigma size 3 posterior stabilized femoral component (cemented), size 2.5 MBT tibial component (cemented), 38 mm three pegged  oval dome patella (cemented), and a 10 mm stabilized rotating platform polyethylene insert.   HOSPITAL COURSE: The patient tolerated the procedure very well. She had no complications. She was then taken to the PAC-U where she was stabilized and then transferred to the orthopedic floor. The patient began receiving anticoagulation therapy of Lovenox 30 mg subcutaneous every 12 hours per anesthesia pharmacy protocol. She was fitted with TED stockings bilaterally. These were allowed to be removed one hour per eight hour shift. The left one was applied on day two following removal of the Hemovac and dressing change. She was also fitted with the AV-I compression foot pumps set at 80 mmHg. This was done bilaterally. Her calves have been nontender and free of any evidence of any deep venous thromboses. Negative Homans sign. Heels were elevated off the bed using rolled towels. She has voiced no complaints as well as tenderness to the heels.   The patient has denied any chest pain or shortness of breath. Vital signs have been stable. She has been afebrile. Hemodynamically she was stable. No transfusions were given other than the Autovac transfusion given the first six hours postoperatively.   Physical therapy was initiated on day one for gait training and transfers. She has done very well. Upon being discharged she was ambulating greater than 200 feet. She was independent with bed to chair transfers. She was able go up and down four sets of steps. Occupational therapy was also initiated on day one for activities of daily living and assistive devices.   The patient's IV catheter and Hemovac were all discontinued on day two along with dressing change. The Polar Care was reapplied to the surgical leg maintaining a temperature  of 40 to 50 degrees Fahrenheit.   DISPOSITION: The patient was discharged to home in improved stable condition.   DISCHARGE INSTRUCTIONS:  1. She will weight bear as tolerated. Continue using a  walker until cleared by physical therapy to go to a quad cane. She will receive home health physical therapy.  2. Continue use of the stockings bilaterally. These may be removed at night but are to be worn all day. Also continue with Polar Care maintaining a temperature of 40 to 50 degrees Fahrenheit. Recommend that she use this as much as possible around-the-clock.  3. She has a follow-up appointment in the clinic in two weeks. She is to call the clinic sooner if any temperatures of 101.5 or greater or excessive bleeding.  4. She is placed on a regular diet.  5. She was given a prescription for Lovenox 40 mg subcutaneously daily for 14 days and then discontinue and begin taking one 81 mg enteric-coated aspirin. She was also given a prescription for oxycodone 5 to 10 mg every 4 to 6 hours p.r.n. pain and Ultram 1 to 2 tablets every 4 to 6 hours p.r.n. pain.   PAST MEDICAL HISTORY:  1. Chickenpox. 2. Cataracts.  3. Osteoarthritis.  4. Gastroesophageal reflux disease.  5. Depression.  6. Rheumatic heart disease with cardiac murmur and rheumatic fever as a child. 7. Degenerative disk disease. 8. Mitral valve prolapse. 9. Celiac disease.  ____________________________ Vance Peper, PA jrw:slb D: 10/27/2011 12:37:16 ET T: 10/28/2011 09:52:38 ET JOB#: [id number]  cc: Vance Peper, PA, <Dictator> Cassy Sprowl PA ELECTRONICALLY SIGNED 10/28/2011 20:38

## 2014-10-21 NOTE — Op Note (Signed)
PATIENT NAME:  Valerie Duran, Valerie Duran MR#:  [id number] DATE OF BIRTH:  09-23-1945  DATE OF PROCEDURE:  08/07/2014  PREOPERATIVE DIAGNOSIS: Right femoral neck fracture.   POSTOPERATIVE DIAGNOSIS: Right femoral neck fracture.   PROCEDURE PERFORMED: Percutaneous pinning of right femoral neck fracture.   SURGEON: Laurice Record. Holley Bouche., MD    ANESTHESIA: General.   ESTIMATED BLOOD LOSS: Minimal.   FLUIDS REPLACED: Crystalloid 800 mL.   DRAINS: None.   IMPLANTS UTILIZED: Synthes three 7.3 mm cannulated partially threaded cancellus screws.   INDICATIONS FOR SURGERY: The patient is a 69 year old female who fell and sustained a right femoral neck fracture. Acceptable alignment was noted. After discussion of the risks and benefits of surgical intervention, the patient expressed understanding of the risks, benefits, and agreed with plans for surgical intervention.   PROCEDURE IN DETAIL: The patient was brought to the operating room and, after adequate general anesthesia was achieved, the patient was transferred to fracture table. All bony prominences were well padded. Positioning was performed and good position was noted in both AP and lateral planes using the C-arm. The patient's right hip and leg were cleaned and prepped with alcohol and DuraPrep and draped in the usual sterile fashion. A "timeout" was performed as per usual protocol. A lateral stab incision was made and a distally threaded guide pin was positioned along the lateral cortex of the femur and position visualized in both AP and lateral planes using the C-arm. The inferior pin was advanced into the femoral neck and head with good position noted. A guide was then placed over the inferior pin and a stab incision was extended proximally. Anterior/superior distally threaded guide pin was inserted into the femoral neck and head. Good position was noted in both AP and lateral planes. Finally, a third guidepin was positioned in the superior and  posterior position and advanced. Good position was noted in both AP and lateral planes using C-arm. Measurements were obtained and three 7.3 mm partially threaded cannulated cancellous screws were then advanced over the guide pins. Good purchase was appreciated and good position was noted in both AP and lateral planes. Guide pins were removed. The wound was irrigated with copious amounts of normal saline with antibiotic solution. The skin incision was then reapproximated using first #0 Vicryl followed by 2-0 Vicryl. Skin was closed with skin staples. Sterile dressing was applied. The patient tolerated the procedure well. She was transported to the recovery room in stable condition.    ____________________________ Laurice Record. Holley Bouche., MD jph:bm D: 08/07/2014 20:57:58 ET T: 08/08/2014 05:32:23 ET JOB#: [id number]  cc: Laurice Record. Holley Bouche., MD, <Dictator> JAMES P Holley Bouche MD ELECTRONICALLY SIGNED 09/08/2014 16:45

## 2014-10-21 NOTE — Discharge Summary (Signed)
PATIENT NAME:  Valerie Duran, Valerie Duran MR#:  [id number] DATE OF BIRTH:  10-05-1945  DATE OF ADMISSION:  08/06/2014 DATE OF DISCHARGE:  08/10/2014  DISCHARGE DIAGNOSES: 1.  Right femur neck fracture status post surgery.  2.  Right radial fracture with conservative management.  3.  Acute blood loss anemia/chronic anemia.  4.  Celiac disease.  5.  Rheumatoid arthritis.   CONSULTANTS: Dr. Marry Guan with orthopedics.   DISCHARGE MEDICATIONS: 1.  Calcium with vitamin D 1 tablet 2 times a day.  2.  Protonix 40 mg daily.  3.  Chondroitin glucosamine 2 tablets oral once a day in the morning.  4.  Magnesium gluconate 500 mg daily.  5.  Pramipexole 1 mg oral 2 times a day.  6.  Duloxetine 60 mg once a day.  7.  Gabapentin 300 mg once a day.  8.  Acetaminophen/hydrocodone 325/10 one tablet oral every 6 hours as needed for pain.  9.  Tramadol 50 mg oral every 4 hours as needed for moderate pain.  10.  Lovenox 40 mg subcutaneous once a day for 2 weeks.   DISCHARGE INSTRUCTIONS: Regular food, regular consistency. Activity as per orthopedic instructions. Home health with physical therapy has been set up at discharge. Orthopedic follow-up in 1 to 2 weeks.   IMAGING STUDIES: Include a right hip x-ray which showed right femoral neck fracture.   ADMITTING HISTORY AND PHYSICAL: Please see detailed H and P dictated previously. In brief, a 69 year old patient who presented to the hospital after a mechanical fall with right hip and arm pain. Was found to have right radial and femur neck fracture, admitted to the hospitalist service.   HOSPITAL COURSE: 1.  Right femur neck fracture. She had surgery. She is doing well. Was on DVT prophylaxis during the hospital stay. Worked with physical therapy and needs home health at discharge, which has been set up. Follow up with orthopedics. Further activity management as per orthopedic instructions.   2.  Right radial fracture. Conservative management with a sling in place.  Follow up with orthopedics. No surgery planned.  3.  Mild acute blood loss anemia/chronic anemia. Stable. Did not need any transfusion.   PHYSICAL EXAMINATION: Prior to discharge, the patient's hip has no swelling, bruising. Lungs sound clear. S1, S2 heard. Abdomen soft.   TIME SPENT ON DAY OF DISCHARGE IN DISCHARGE ACTIVITY: 40 minutes.  ____________________________ Leia Alf Kaelen Caughlin, MD srs:sb D: 08/14/2014 11:15:53 ET T: 08/14/2014 14:40:47 ET JOB#: [id number]  cc: Alveta Heimlich R. Darvin Neighbours, MD, <Dictator> Neita Carp MD ELECTRONICALLY SIGNED 08/14/2014 19:14

## 2014-10-21 NOTE — H&P (Signed)
PATIENT NAME:  Valerie Duran, Valerie Duran MR#:  [id number] DATE OF BIRTH:  04-Aug-1945  DATE OF ADMISSION:  08/06/2014  PRIMARY CARE PHYSICIAN:  Dr. Baldemar Lenis.    CHIEF COMPLAINT: Right hip and arm pain after a fall.   HISTORY OF PRESENT ILLNESS:  A 69 year old Caucasian female patient with history of rheumatoid arthritis, celiac disease, prior bilateral knee replacements, presented to the Emergency Room after she tripped and fell in her garage landing on her right side. The patient noticed that she had pain in her right arm and hip. She was hoping this would get better, but she was barely able to walk, presented to the Emergency Room. Here she has femoral displaced neck fracture along with a radial fracture and is being admitted to the hospital.   Presently her pain is better after some pain medications in the ER. She has not had any fractures in the past, does not have any recurrent falls. She did have bilateral knee replacements 2 years prior which were uneventful without complications although she did receive 2 units of packed RBC according to the patient. No problems with anesthesia. She does seem to have good functional status. No chest pain, shortness of breath on walking. The patient does get occasional chest burning due to her GERD which was evaluated with a stress test, which was normal per patient. No CAD. She did have rheumatic fever as a kid and has a chronic heart murmur.   PAST MEDICAL HISTORY:  1. Celiac disease.  2. Chronic anemia.  3. Rheumatoid arthritis.  4. Rheumatic fever with a heart murmur.  5. GERD.  6. Bilateral knee replacements.  7. Cataract extraction.  8. Lumbar fusion.  9. Hysterectomy.   ALLERGIES: AMBIEN, CODEINE, IBUPROFEN, ZOCOR, TAPE.  FAMILY HISTORY: Hypertension.   SOCIAL HISTORY: The patient does not smoke. No alcohol. No illicit drug use. Ambulates on her own.   CODE STATUS: Full code.   REVIEW OF SYSTEMS:  CONSTITUTIONAL:  No fever or fatigue.  EYES: No  blurred vision, pain, redness.  EARS, NOSE, AND THROAT: No tinnitus, ear pain, hearing loss.  RESPIRATORY: No cough, wheeze, hemoptysis.  CARDIOVASCULAR: No chest pain, orthopnea, or edema.  GASTROINTESTINAL: No nausea, vomiting, diarrhea, abdominal pain.  GENITOURINARY: No dysuria, hematuria, frequency.  ENDOCRINE: No polyuria, nocturia, thyroid problems.  HEMATOLOGIC AND LYMPHATIC: Has chronic anemia.  INTEGUMENTARY: No acne, rash, lesion.  MUSCULOSKELETAL: Has pain in her hip and arm with the fractures. Also has rheumatoid arthritis.  NEUROLOGIC: No focal numbness, weakness.  PSYCHIATRIC: No anxiety or depression.   HOME MEDICATIONS:  1. Requip 1 mg 2 times a day.  2. Acetaminophen/hydrocodone 5/325 one tablet every six hours as needed.  3. Calcium and vitamin D 1 tablet 2 times a day.  4. Chondroitin glucosamine 2 tablets once a day.  5. Fluoxetine 60 mg daily.  6. Gabapentin 300 mg once a day.  7. Magnesium gluconate 500 mg daily.  8. Protonix 40 mg daily.   PHYSICAL EXAMINATION:  VITAL SIGNS: Temperature 98.6, pulse of 94, blood pressure 171/75, saturating 98% on room air.  GENERAL:  Moderately built Caucasian female patient lying in bed, overall seems comfortable, conversational, cooperative with exam.  PSYCHIATRIC: Alert and oriented x 3. Mood and affect appropriate. Judgment intact. HEENT: Atraumatic, normocephalic. Oral mucosa moist and pink. External ears and nose normal. No pallor. No icterus. Pupils bilaterally equal and reactive to light.  NECK: Supple. No thyromegaly. No palpable lymph nodes. Trachea midline. No carotid bruit or JVD.  CARDIOVASCULAR: S1, S2, without any murmurs.  Peripheral pulses 2 +. No edema.  RESPIRATORY: Normal work of breathing. Clear to auscultation on both sides.   GASTROINTESTINAL: Soft abdomen, nontender. Bowel sounds present. No hepatosplenomegaly palpable.  SKIN: Warm and dry. No petechiae, rash, ulcers.  MUSCULOSKELETAL: No joint swelling,  redness, effusion of the large joints. Normal muscle tone.  NEUROLOGICAL: Motor strength 5/5 in upper and lower extremities. Sensation is intact all over.  LYMPHATIC: No cervical lymphadenopathy.   LABORATORY STUDIES:  Show glucose of 120, BUN 9, creatinine 0.74, sodium 138, potassium 3.7. AST, ALT, alkaline phosphatase, bilirubin normal. WBC 10.7, hemoglobin 9.1, with platelets of 345,000, MCV 69.   EKG shows normal sinus rhythm with some LVH and occasional PVCs.   X-ray of the right femur shows mildly displaced right femoral neck fracture.   X-ray of the forearm showed nondisplaced fracture of the proximal right radial metaphysis.   ASSESSMENT AND PLAN:  1.  Right displaced femur neck fracture. This needs surgery. The patient will be admitted to the hospital. Consult orthopedics Dr. Marry Guan who the patient has seen in the past. The patient is a low risk for her orthopedic surgery. We will monitor hemoglobin for any acute blood loss anemia with surgery and may need transfusion if lower than 7. Discussed this and obtained consent from the patient for blood transfusion. Pain control, DVT prophylaxis, physical therapy per orthopedics.  2.  Chronic anemia, stable, secondary to her celiac disease.  3.  Elevated blood pressure without diagnosis of hypertension. The patient's blood pressure in the range of 170s likely from pain, but if this is sustained high would start her on antihypertensive medications.  4.  Deep vein thrombosis prophylaxis per Dr. Marry Guan.   5. Radial fracture, Right. Likely consevative management. orthopedics on board.  CODE STATUS: Full code.   TIME SPENT ON THIS CASE: 45 minutes.     ____________________________ Leia Alf Kionte Baumgardner, MD srs:bu D: 08/06/2014 17:05:37 ET T: 08/06/2014 17:21:33 ET JOB#: [id number]  cc: Alveta Heimlich R. Gatsby Chismar, MD, <Dictator> Derinda Late, MD Laurice Record. Holley Bouche., MD Alveta Heimlich Arlice Colt MD ELECTRONICALLY SIGNED 08/06/2014 20:18

## 2014-10-21 NOTE — Consult Note (Signed)
Brief Consult Note: Diagnosis: right femoral neck fracture, right radial neck fracture.   Patient was seen by consultant.   Comments: Right radial neck fracture can be treated nonoperatively with immobilization.  I recommend percutaneous pinning of the right femoral neck fracture. The risks and benefits of surgical intervention were discussed in detail with the patient. The risks include but are not limited to bleeding, infection, nonunion, avascular necrosis of the femoral head, and DVT/PE. The patient expressed understanding of the risks and benefits and agreed with plans for surgery.   Surgical site signed as per "right site surgery" protocol.  Electronic Signatures: Dereck Leep (MD)  (Signed 15-Feb-16 19:58)  Authored: Brief Consult Note   Last Updated: 15-Feb-16 19:58 by Dereck Leep (MD)

## 2014-11-15 ENCOUNTER — Other Ambulatory Visit: Payer: Self-pay | Admitting: Orthopedic Surgery

## 2014-11-15 DIAGNOSIS — M5416 Radiculopathy, lumbar region: Secondary | ICD-10-CM

## 2014-11-24 ENCOUNTER — Ambulatory Visit: Payer: Medicare Other

## 2014-11-29 ENCOUNTER — Ambulatory Visit
Admission: AD | Admit: 2014-11-29 | Discharge: 2014-11-29 | Disposition: A | Discharge status: Home / Self Care | Payer: Medicare Other | Source: Ambulatory Visit | Attending: Orthopedic Surgery | Admitting: Orthopedic Surgery

## 2014-11-29 ENCOUNTER — Ambulatory Visit
Admission: RE | Admit: 2014-11-29 | Discharge: 2014-11-29 | Disposition: A | Discharge status: Home / Self Care | Payer: Medicare Other | Source: Ambulatory Visit | Attending: Orthopedic Surgery | Admitting: Orthopedic Surgery

## 2014-11-29 ENCOUNTER — Other Ambulatory Visit: Payer: Self-pay | Admitting: Orthopedic Surgery

## 2014-11-29 DIAGNOSIS — R600 Localized edema: Secondary | ICD-10-CM | POA: Insufficient documentation

## 2014-11-29 DIAGNOSIS — M7989 Other specified soft tissue disorders: Secondary | ICD-10-CM

## 2014-11-30 ENCOUNTER — Other Ambulatory Visit: Payer: Self-pay | Admitting: Orthopedic Surgery

## 2014-11-30 DIAGNOSIS — R600 Localized edema: Secondary | ICD-10-CM

## 2014-12-13 ENCOUNTER — Ambulatory Visit: Payer: Medicare Other

## 2014-12-13 ENCOUNTER — Encounter: Payer: Self-pay | Admitting: Hematology and Oncology

## 2014-12-13 ENCOUNTER — Inpatient Hospital Stay: Payer: Medicare Other | Attending: Hematology and Oncology | Admitting: Hematology and Oncology

## 2014-12-13 VITALS — BP 123/69 | HR 80 | Temp 97.5°F | Ht 68.0 in

## 2014-12-13 DIAGNOSIS — M549 Dorsalgia, unspecified: Secondary | ICD-10-CM

## 2014-12-13 DIAGNOSIS — M545 Low back pain, unspecified: Secondary | ICD-10-CM | POA: Insufficient documentation

## 2014-12-13 DIAGNOSIS — G2581 Restless legs syndrome: Secondary | ICD-10-CM | POA: Insufficient documentation

## 2014-12-13 DIAGNOSIS — Z8781 Personal history of (healed) traumatic fracture: Secondary | ICD-10-CM

## 2014-12-13 DIAGNOSIS — R011 Cardiac murmur, unspecified: Secondary | ICD-10-CM

## 2014-12-13 DIAGNOSIS — Z90710 Acquired absence of both cervix and uterus: Secondary | ICD-10-CM

## 2014-12-13 DIAGNOSIS — Z79899 Other long term (current) drug therapy: Secondary | ICD-10-CM | POA: Diagnosis not present

## 2014-12-13 DIAGNOSIS — K900 Celiac disease: Secondary | ICD-10-CM

## 2014-12-13 DIAGNOSIS — K219 Gastro-esophageal reflux disease without esophagitis: Secondary | ICD-10-CM | POA: Insufficient documentation

## 2014-12-13 DIAGNOSIS — D509 Iron deficiency anemia, unspecified: Secondary | ICD-10-CM

## 2014-12-13 DIAGNOSIS — F329 Major depressive disorder, single episode, unspecified: Secondary | ICD-10-CM | POA: Insufficient documentation

## 2014-12-13 DIAGNOSIS — R5383 Other fatigue: Secondary | ICD-10-CM | POA: Insufficient documentation

## 2014-12-13 DIAGNOSIS — Z800 Family history of malignant neoplasm of digestive organs: Secondary | ICD-10-CM

## 2014-12-13 DIAGNOSIS — D649 Anemia, unspecified: Secondary | ICD-10-CM

## 2014-12-13 DIAGNOSIS — F32A Depression, unspecified: Secondary | ICD-10-CM | POA: Insufficient documentation

## 2014-12-13 DIAGNOSIS — M129 Arthropathy, unspecified: Secondary | ICD-10-CM | POA: Diagnosis not present

## 2014-12-13 DIAGNOSIS — M25559 Pain in unspecified hip: Secondary | ICD-10-CM

## 2014-12-13 DIAGNOSIS — G8929 Other chronic pain: Secondary | ICD-10-CM | POA: Insufficient documentation

## 2014-12-13 LAB — FOLATE: Folate: 23 ng/mL (ref >5.9)

## 2014-12-13 LAB — CBC WITH DIFFERENTIAL/PLATELET
Basophils Absolute: 0.1 10*3/uL (ref 0–0.1)
Basophils Relative: 1 %
Eosinophils Absolute: 0.3 10*3/uL (ref 0–0.7)
Eosinophils Relative: 4 %
HCT: 27.9 % — ABNORMAL LOW (ref 35.0–47.0)
Hemoglobin: 8.5 g/dL — ABNORMAL LOW (ref 12.0–16.0)
Lymphocytes Relative: 33 %
Lymphs Abs: 2.4 10*3/uL (ref 1.0–3.6)
MCH: 20.7 pg — ABNORMAL LOW (ref 26.0–34.0)
MCHC: 30.5 g/dL — ABNORMAL LOW (ref 32.0–36.0)
MCV: 67.9 fL — ABNORMAL LOW (ref 80.0–100.0)
Monocytes Absolute: 0.6 10*3/uL (ref 0.2–0.9)
Monocytes Relative: 9 %
Neutro Abs: 3.9 10*3/uL (ref 1.4–6.5)
Neutrophils Relative %: 53 %
Platelets: 469 10*3/uL — ABNORMAL HIGH (ref 150–440)
RBC: 4.11 MIL/uL (ref 3.80–5.20)
RDW: 18.4 % — ABNORMAL HIGH (ref 11.5–14.5)
WBC: 7.2 10*3/uL (ref 3.6–11.0)

## 2014-12-13 LAB — IRON AND TIBC
Iron: 11 ug/dL — ABNORMAL LOW (ref 28–170)
Saturation Ratios: 3 % — ABNORMAL LOW (ref 10.4–31.8)
TIBC: 435 ug/dL (ref 250–450)
UIBC: 424 ug/dL

## 2014-12-13 LAB — FERRITIN: Ferritin: 8 ng/mL — ABNORMAL LOW (ref 11–307)

## 2014-12-13 LAB — TSH: TSH: 3.184 u[IU]/mL (ref 0.350–4.500)

## 2014-12-13 LAB — RETICULOCYTES
RBC.: 4.11 MIL/uL (ref 3.80–5.20)
Retic Count, Absolute: 53.4 10*3/uL (ref 19.0–183.0)
Retic Ct Pct: 1.3 % (ref 0.4–3.1)

## 2014-12-13 LAB — VITAMIN B12: Vitamin B-12: 616 pg/mL (ref 180–914)

## 2014-12-14 ENCOUNTER — Inpatient Hospital Stay: Payer: Medicare Other

## 2014-12-14 VITALS — BP 168/75 | HR 82 | Temp 96.0°F | Resp 20

## 2014-12-14 DIAGNOSIS — D649 Anemia, unspecified: Secondary | ICD-10-CM | POA: Diagnosis not present

## 2014-12-14 LAB — ABO/RH: ABO/RH(D): A NEG

## 2014-12-14 LAB — PREPARE RBC (CROSSMATCH)

## 2014-12-14 MED ORDER — DIPHENHYDRAMINE HCL 25 MG PO CAPS: 25.0000 mg | ORAL_CAPSULE | Freq: Once | ORAL | Status: DC

## 2014-12-14 MED ORDER — SODIUM CHLORIDE 0.9 % IV SOLN
250.0000 mL | Freq: Once | INTRAVENOUS | Status: DC
  Filled 2014-12-14: qty 250

## 2014-12-14 MED ORDER — ACETAMINOPHEN 325 MG PO TABS
ORAL_TABLET | ORAL | Status: AC
  Filled 2014-12-14: qty 2

## 2014-12-14 MED ORDER — HEPARIN SOD (PORK) LOCK FLUSH 100 UNIT/ML IV SOLN: 500.0000 [IU] | Freq: Every day | INTRAVENOUS | Status: DC | PRN

## 2014-12-14 MED ORDER — SODIUM CHLORIDE 0.9 % IJ SOLN
10.0000 mL | INTRAMUSCULAR | Status: DC | PRN
  Filled 2014-12-14: qty 10

## 2014-12-14 MED ORDER — ACETAMINOPHEN 325 MG PO TABS
650.0000 mg | ORAL_TABLET | Freq: Once | ORAL | Status: AC
  Administered 2014-12-14: 650 mg via ORAL

## 2014-12-14 MED ORDER — SODIUM CHLORIDE 0.9 % IV SOLN
250.0000 mL | Freq: Once | INTRAVENOUS | Status: AC
  Administered 2014-12-14: 250 mL via INTRAVENOUS
  Filled 2014-12-14: qty 250

## 2014-12-14 MED ORDER — HEPARIN SOD (PORK) LOCK FLUSH 100 UNIT/ML IV SOLN: 250.0000 [IU] | INTRAVENOUS | Status: DC | PRN

## 2014-12-14 MED ORDER — DIPHENHYDRAMINE HCL 25 MG PO TABS
25.0000 mg | ORAL_TABLET | Freq: Once | ORAL | Status: AC
  Administered 2014-12-14: 25 mg via ORAL
  Filled 2014-12-14: qty 1

## 2014-12-14 MED ORDER — DIPHENHYDRAMINE HCL 25 MG PO CAPS
ORAL_CAPSULE | ORAL | Status: AC
  Filled 2014-12-14: qty 1

## 2014-12-14 MED ORDER — ACETAMINOPHEN 325 MG PO TABS: 650.0000 mg | ORAL_TABLET | Freq: Once | ORAL | Status: DC

## 2014-12-14 MED ORDER — SODIUM CHLORIDE 0.9 % IJ SOLN
3.0000 mL | INTRAMUSCULAR | Status: DC | PRN
  Filled 2014-12-14: qty 10

## 2014-12-15 LAB — TYPE AND SCREEN
ABO/RH(D): A NEG
Antibody Screen: NEGATIVE
Unit division: 0
Unit division: 0

## 2014-12-20 ENCOUNTER — Inpatient Hospital Stay: Payer: Medicare Other

## 2014-12-20 VITALS — BP 143/71 | HR 81 | Resp 20

## 2014-12-20 DIAGNOSIS — D649 Anemia, unspecified: Secondary | ICD-10-CM | POA: Diagnosis not present

## 2014-12-20 DIAGNOSIS — D509 Iron deficiency anemia, unspecified: Secondary | ICD-10-CM | POA: Insufficient documentation

## 2014-12-20 MED ORDER — SODIUM CHLORIDE 0.9 % IJ SOLN
10.0000 mL | INTRAMUSCULAR | Status: DC | PRN
  Filled 2014-12-20: qty 10

## 2014-12-20 MED ORDER — SODIUM CHLORIDE 0.9 % IV SOLN
200.0000 mg | Freq: Once | INTRAVENOUS | Status: AC
  Administered 2014-12-20: 200 mg via INTRAVENOUS
  Filled 2014-12-20: qty 10

## 2014-12-20 MED ORDER — SODIUM CHLORIDE 0.9 % IV SOLN
Freq: Once | INTRAVENOUS | Status: AC
  Administered 2014-12-20: 16:00:00 via INTRAVENOUS
  Filled 2014-12-20: qty 1000

## 2014-12-20 MED ORDER — SODIUM CHLORIDE 0.9 % IJ SOLN
3.0000 mL | Freq: Once | INTRAMUSCULAR | Status: DC | PRN
  Filled 2014-12-20: qty 10

## 2014-12-25 NOTE — Progress Notes (Signed)
[facility] day:  12/13/2014  Chief Complaint: Valerie Duran is a 69 y.o. female with chronic anemia who is referred by Dr. Marry Guan of orthopedic surgery for assessment and management.  HPI:  The patient has a history of anemia since age of 71. She says at some point she was hospitalized for anemia that was never sorted out. She has tried supplements. After a  hysterectomy she got stronger.  Approximately 5 years ago, she had symptomatic anemia.  EGD and colonoscopy by Dr. Donnella Sham revealed  no abnormality. She did not have a capsule study.  She never had IV iron.  She notes that about 2-1/2 years ago she was diagnosed with celiac disease. She is "unable to get her nutrients". She states that she cannot absorb iron pills.  Her diet is gluten-free year. She tries meat hamburger, vegetables. She swells with glutens. She does not eat much as "food doesn't taste good". Bowels move every 2-3 days. She states magnesium helps. She denies any melena or hematochezia.  She states that she fractured her hip on 08/05/2014. She states that she is "not healing properly". While mobilizing with physical therapy, her "pins backed out".  She states that she can't walk much.  She is able to perform her ADLs.  She states that Dr. Marry Guan is waiting for "her blood to be straighten out" before proceeding with surgical correction.  Hematocrit has slowly drifted down.  Labs on 05/01/2014 revealed a hematocrit of 31.7, hemoglobin 9.5, and MCV 73.5.  Labs on 10/23/2014 revealed a hematocrit 29.8, hemoglobin 9, and MCV 70.3.  Creatinine was 0.5.  Labs on 12/03/2014 revealed a hematocrit 26.4, hemoglobin 8.1, MCV 70.4, platelets 410,000 and white count 4300 with an ANC of 2300.   Symptomatically the patient notes back and hip pain. She has a lot of discomfort. She can go about 10 minutes. She has a walker but can hardly walk. Her weight fluctuates up and down. She drinks 3 boost a day.   She notes dizziness/pre-syncope.  She has difficulty standing.  She feels like she "needs a transfusion".  Past Medical History  Diagnosis Date  . Anemia   . Heart murmur   . Arthritis     Past Surgical History  Procedure Laterality Date  . Abdominal hysterectomy      Family History  Problem Relation Age of Onset  . Cancer Father 38    Colon Cancer    Social History:  reports that she has never smoked. She does not have any smokeless tobacco history on file. She reports that she does not drink alcohol. Her drug history is not on file.  The patient is accompanied by her husband, Fulton Reek, and Her daughter, Rianne.  Allergies:  Allergies  Allergen Reactions  . Barley Grass Other (See Comments)    Celiac disease  . Codeine Nausea Only  . Tape Other (See Comments)    Adhesive tape - tears skin  . Zolpidem Tartrate Other (See Comments)  . Ibuprofen Other (See Comments) and Rash    GI upset " makes my stomach hurt"  . Simvastatin Rash  . Wheat Bran Rash    Celiac disease    Current Medications: Current Outpatient Prescriptions  Medication Sig Dispense Refill  . Calcium-Vitamin D 600-200 MG-UNIT per tablet Take by mouth.    . diazepam (VALIUM) 5 MG tablet     . furosemide (LASIX) 20 MG tablet Take by mouth.    . gabapentin (NEURONTIN)  300 MG capsule Take by mouth.    . Glucosamine-Chondroitin 750-600 MG TABS Take by mouth.    . pantoprazole (PROTONIX) 40 MG tablet TAKE 1 TABLET BY MOUTH EVERY DAY    . pramipexole (MIRAPEX) 1 MG tablet Take 2 tablets by mouth daily.    . Calcium Carbonate-Vitamin D (CALCIUM 600 + D PO) Take 1 tablet by mouth daily.      . DULoxetine (CYMBALTA) 60 MG capsule Take 60 mg by mouth daily.      Marland Kitchen GLUCOSAMINE HCL PO Take 1 tablet by mouth daily.      Marland Kitchen HYDROcodone-acetaminophen (LORTAB) 10-500 MG per tablet Take 1 tablet by mouth every 6 (six) hours as needed. For pain     . Magnesium 500 MG CAPS Take 1 capsule by mouth daily.     Current  Facility-Administered Medications  Medication Dose Route Frequency Provider Last Rate Last Dose  . 0.9 %  sodium chloride infusion  250 mL Intravenous Once Lequita Asal, MD      . acetaminophen (TYLENOL) tablet 650 mg  650 mg Oral Once Lequita Asal, MD      . diphenhydrAMINE (BENADRYL) capsule 25 mg  25 mg Oral Once Lequita Asal, MD      . heparin lock flush 100 unit/mL  500 Units Intracatheter Daily PRN Lequita Asal, MD      . heparin lock flush 100 unit/mL  250 Units Intracatheter PRN Lequita Asal, MD      . sodium chloride 0.9 % injection 10 mL  10 mL Intracatheter PRN Lequita Asal, MD      . sodium chloride 0.9 % injection 3 mL  3 mL Intracatheter PRN Lequita Asal, MD        Review of Systems:  GENERAL:  Feels tired.  No fevers, sweats or weight loss.  Weight fluctuates up and down. PERFORMANCE STATUS (ECOG): 1-2. HEENT:  No visual changes, runny nose, sore throat, mouth sores or tenderness. Lungs: No shortness of breath or cough.  No hemoptysis. Cardiac:  No chest pain, palpitations, orthopnea, or PND. GI:  Food doesn't take good.  No nausea, vomiting, diarrhea, constipation, melena or hematochezia. GU:  No urgency, frequency, dysuria, or hematuria. Musculoskeletal: Back and hip pain.  Difficulty ambulating secondary to pain.  Arthritis in joints.  No muscle tenderness. Extremities:  No pain or swelling. Skin:  No rashes or skin changes. Neuro:  No headache, numbness or weakness, balance or coordination issues. Endocrine:  No diabetes, thyroid issues, hot flashes or night sweats. Psych:  No mood changes, depression or anxiety. Pain:  No focal pain. Review of systems:  All other systems reviewed and found to be negative.  Physical Exam: Blood pressure 123/69, pulse 80, temperature 97.5 F (36.4 C), temperature source Tympanic, height 5' 8"  (1.727 m), SpO2 100 %. GENERAL:  Chronically fatigued appearing woman sitting comfortably in a  wheelchair in the exam room in no acute distress. MENTAL STATUS:  Alert and oriented to person, place and time. HEAD:  Dark brown shoulder length hair.  Normocephalic, atraumatic, face symmetric, no Cushingoid features. EYES:  Blue eyes.  Pupils equal round and reactive to light and accomodation.  No conjunctivitis or scleral icterus. ENT:  Oropharynx clear without lesion.  Tongue normal. Mucous membranes moist.  RESPIRATORY:  Clear to auscultation without rales, wheezes or rhonchi. CARDIOVASCULAR:  Regular rate and rhythm without murmur, rub or gallop. ABDOMEN:  Soft, non-tender, with active bowel sounds, and no hepatosplenomegaly.  No masses. SKIN:  No rashes, ulcers or lesions. EXTREMITIES: No edema, no skin discoloration or tenderness.  No palpable cords. LYMPH NODES: No palpable cervical, supraclavicular, axillary or inguinal adenopathy  NEUROLOGICAL: Unremarkable. PSYCH:  Appropriate.   Appointment on 12/13/2014  Component Date Value Ref Range Status  . ABO/RH(D) 12/13/2014 A NEG   Final  . Antibody Screen 12/13/2014 NEG   Final  . Sample Expiration 12/13/2014 12/16/2014   Final  . Unit Number 12/13/2014 [id number]   Final  . Blood Component Type 12/13/2014 RED CELLS,LR   Final  . Unit division 12/13/2014 00   Final  . Status of Unit 12/13/2014 ISSUED,FINAL   Final  . Transfusion Status 12/13/2014 OK TO TRANSFUSE   Final  . Crossmatch Result 12/13/2014 Compatible   Final  . Unit Number 12/13/2014 [id number]   Final  . Blood Component Type 12/13/2014 RBC, LR IRR   Final  . Unit division 12/13/2014 00   Final  . Status of Unit 12/13/2014 ISSUED,FINAL   Final  . Transfusion Status 12/13/2014 OK TO TRANSFUSE   Final  . Crossmatch Result 12/13/2014 Compatible   Final  . WBC 12/13/2014 7.2  3.6 - 11.0 K/uL Final  . RBC 12/13/2014 4.11  3.80 - 5.20 MIL/uL Final  . Hemoglobin 12/13/2014 8.5* 12.0 - 16.0 g/dL Final  . HCT 12/13/2014 27.9* 35.0 - 47.0 % Final  . MCV 12/13/2014  67.9* 80.0 - 100.0 fL Final  . MCH 12/13/2014 20.7* 26.0 - 34.0 pg Final  . MCHC 12/13/2014 30.5* 32.0 - 36.0 g/dL Final  . RDW 12/13/2014 18.4* 11.5 - 14.5 % Final  . Platelets 12/13/2014 469* 150 - 440 K/uL Final  . Neutrophils Relative % 12/13/2014 53   Final  . Neutro Abs 12/13/2014 3.9  1.4 - 6.5 K/uL Final  . Lymphocytes Relative 12/13/2014 33   Final  . Lymphs Abs 12/13/2014 2.4  1.0 - 3.6 K/uL Final  . Monocytes Relative 12/13/2014 9   Final  . Monocytes Absolute 12/13/2014 0.6  0.2 - 0.9 K/uL Final  . Eosinophils Relative 12/13/2014 4   Final  . Eosinophils Absolute 12/13/2014 0.3  0 - 0.7 K/uL Final  . Basophils Relative 12/13/2014 1   Final  . Basophils Absolute 12/13/2014 0.1  0 - 0.1 K/uL Final  . Ferritin 12/13/2014 8* 11 - 307 ng/mL Final  . Iron 12/13/2014 11* 28 - 170 ug/dL Final  . TIBC 12/13/2014 435  250 - 450 ug/dL Final  . Saturation Ratios 12/13/2014 3* 10.4 - 31.8 % Final  . UIBC 12/13/2014 424   Final  . Vitamin B-12 12/13/2014 616  180 - 914 pg/mL Final   Comment: (NOTE) This assay is not validated for testing neonatal or myeloproliferative syndrome specimens for Vitamin B12 levels. Performed at [facility]   . Folate 12/13/2014 23.0  >5.9 ng/mL Final  . TSH 12/13/2014 3.184  0.350 - 4.500 uIU/mL Final  . Retic Ct Pct 12/13/2014 1.3  0.4 - 3.1 % Final  . RBC. 12/13/2014 4.11  3.80 - 5.20 MIL/uL Final  . Retic Count, Manual 12/13/2014 53.4  19.0 - 183.0 K/uL Final  . ABO/RH(D) 12/13/2014 A NEG   Final  Office Visit on 12/13/2014  Component Date Value Ref Range Status  . Order Confirmation 12/13/2014 ORDER PROCESSED BY BLOOD BANK   Final    Assessment:  CANDY LEVERETT is a 69 y.o. female with celiac disease and difficulty absorbing nutrients.  She has a history of progressive  anemia.  EGD and colonoscopy in 2011 revealed no apparent abnormality.  Diet is modest.  She denies any melena or hematochezia.  She fell on 08/05/2014 and fractured her hip.   She requires surgical revision.  Symptomatically, she is extremely fatigued.  Exam is unremakable.  Plan: 1. Discuss symptomatic anemia.  Patient requests transfusion.  Discuss risks and benefits.  Consent form obtained.  Discussed 2 units PRBCs and initiation of IV iron to correct iron deficiency anemia. 2. Labs today:  CBC with diff, ferritin, iron studies, B12, folate, TSH. 3. Type and cross 2 units PRBCs.  Transfuse on 12/14/2014. 4. Preauth Venofer.  Information on Venofer. 5. Contact Dr. Marry Guan regarding hematocrit/hemoglobin goal for surgery. 6. RTC weekly x 4 for Venofer. 7. RTC in 4 weeks for MD assessment and labs (CBC with diff and ferritin).   Lequita Asal, MD

## 2014-12-27 ENCOUNTER — Inpatient Hospital Stay: Payer: Medicare Other

## 2014-12-27 ENCOUNTER — Ambulatory Visit: Payer: Medicare Other | Admitting: Oncology

## 2014-12-27 ENCOUNTER — Inpatient Hospital Stay: Payer: Medicare Other | Attending: Hematology and Oncology

## 2014-12-27 DIAGNOSIS — Z8781 Personal history of (healed) traumatic fracture: Secondary | ICD-10-CM | POA: Insufficient documentation

## 2014-12-27 DIAGNOSIS — R011 Cardiac murmur, unspecified: Secondary | ICD-10-CM | POA: Insufficient documentation

## 2014-12-27 DIAGNOSIS — F329 Major depressive disorder, single episode, unspecified: Secondary | ICD-10-CM | POA: Insufficient documentation

## 2014-12-27 DIAGNOSIS — Z79899 Other long term (current) drug therapy: Secondary | ICD-10-CM | POA: Diagnosis not present

## 2014-12-27 DIAGNOSIS — D649 Anemia, unspecified: Secondary | ICD-10-CM

## 2014-12-27 DIAGNOSIS — D509 Iron deficiency anemia, unspecified: Secondary | ICD-10-CM

## 2014-12-27 DIAGNOSIS — K219 Gastro-esophageal reflux disease without esophagitis: Secondary | ICD-10-CM | POA: Insufficient documentation

## 2014-12-27 DIAGNOSIS — M129 Arthropathy, unspecified: Secondary | ICD-10-CM | POA: Diagnosis not present

## 2014-12-27 DIAGNOSIS — K900 Celiac disease: Secondary | ICD-10-CM | POA: Diagnosis not present

## 2014-12-27 LAB — CBC WITH DIFFERENTIAL/PLATELET
Basophils Absolute: 0.1 10*3/uL (ref 0–0.1)
Basophils Relative: 1 %
Eosinophils Absolute: 0.2 10*3/uL (ref 0–0.7)
Eosinophils Relative: 3 %
HCT: 35.3 % (ref 35.0–47.0)
Hemoglobin: 10.9 g/dL — ABNORMAL LOW (ref 12.0–16.0)
Lymphocytes Relative: 37 %
Lymphs Abs: 2.6 10*3/uL (ref 1.0–3.6)
MCH: 22.9 pg — ABNORMAL LOW (ref 26.0–34.0)
MCHC: 30.9 g/dL — ABNORMAL LOW (ref 32.0–36.0)
MCV: 74 fL — ABNORMAL LOW (ref 80.0–100.0)
Monocytes Absolute: 0.4 10*3/uL (ref 0.2–0.9)
Monocytes Relative: 6 %
Neutro Abs: 3.6 10*3/uL (ref 1.4–6.5)
Neutrophils Relative %: 53 %
Platelets: 346 10*3/uL (ref 150–440)
RBC: 4.77 MIL/uL (ref 3.80–5.20)
RDW: 24.3 % — ABNORMAL HIGH (ref 11.5–14.5)
WBC: 6.9 10*3/uL (ref 3.6–11.0)

## 2014-12-27 LAB — FERRITIN: Ferritin: 119 ng/mL (ref 11–307)

## 2014-12-27 MED ORDER — SODIUM CHLORIDE 0.9 % IV SOLN
200.0000 mg | Freq: Once | INTRAVENOUS | Status: AC
  Administered 2014-12-27: 200 mg via INTRAVENOUS
  Filled 2014-12-27: qty 10

## 2014-12-27 MED ORDER — SODIUM CHLORIDE 0.9 % IV SOLN
Freq: Once | INTRAVENOUS | Status: AC
  Administered 2018-02-04: 08:00:00 via INTRAVENOUS
  Filled 2014-12-27: qty 1000

## 2015-01-03 ENCOUNTER — Inpatient Hospital Stay: Payer: Medicare Other

## 2015-01-03 DIAGNOSIS — D649 Anemia, unspecified: Secondary | ICD-10-CM

## 2015-01-03 DIAGNOSIS — D509 Iron deficiency anemia, unspecified: Secondary | ICD-10-CM | POA: Diagnosis not present

## 2015-01-03 MED ORDER — SODIUM CHLORIDE 0.9 % IV SOLN
Freq: Once | INTRAVENOUS | Status: AC
  Administered 2015-01-03: 16:00:00 via INTRAVENOUS
  Filled 2015-01-03: qty 1000

## 2015-01-03 MED ORDER — SODIUM CHLORIDE 0.9 % IV SOLN
200.0000 mg | Freq: Once | INTRAVENOUS | Status: AC
  Administered 2015-01-03: 200 mg via INTRAVENOUS
  Filled 2015-01-03: qty 10

## 2015-01-03 MED ORDER — SODIUM CHLORIDE 0.9 % IJ SOLN
10.0000 mL | INTRAMUSCULAR | Status: DC | PRN
  Filled 2015-01-03: qty 10

## 2015-01-09 ENCOUNTER — Encounter
Admission: RE | Admit: 2015-01-09 | Discharge: 2015-01-09 | Disposition: A | Discharge status: Home / Self Care | Payer: Medicare Other | Source: Ambulatory Visit | Attending: Orthopedic Surgery | Admitting: Orthopedic Surgery

## 2015-01-09 DIAGNOSIS — Z01812 Encounter for preprocedural laboratory examination: Secondary | ICD-10-CM | POA: Insufficient documentation

## 2015-01-09 HISTORY — DX: Gastro-esophageal reflux disease without esophagitis: K21.9

## 2015-01-09 HISTORY — DX: Celiac disease: K90.0

## 2015-01-09 HISTORY — DX: Depression, unspecified: F32.A

## 2015-01-09 HISTORY — DX: Major depressive disorder, single episode, unspecified: F32.9

## 2015-01-09 LAB — URINALYSIS COMPLETE WITH MICROSCOPIC (ARMC ONLY)
BILIRUBIN URINE: NEGATIVE
Bacteria, UA: NONE SEEN
Glucose, UA: NEGATIVE mg/dL
Ketones, ur: NEGATIVE mg/dL
Nitrite: NEGATIVE
PH: 5 (ref 5.0–8.0)
PROTEIN: NEGATIVE mg/dL
Specific Gravity, Urine: 1.008 (ref 1.005–1.030)

## 2015-01-09 LAB — BASIC METABOLIC PANEL
Anion gap: 9 (ref 5–15)
BUN: 10 mg/dL (ref 6–20)
CO2: 30 mmol/L (ref 22–32)
Calcium: 9.6 mg/dL (ref 8.9–10.3)
Chloride: 99 mmol/L — ABNORMAL LOW (ref 101–111)
Creatinine, Ser: 0.7 mg/dL (ref 0.44–1.00)
Glucose, Bld: 151 mg/dL — ABNORMAL HIGH (ref 65–99)
Potassium: 3.5 mmol/L (ref 3.5–5.1)
Sodium: 138 mmol/L (ref 135–145)

## 2015-01-09 LAB — CBC
HCT: 38.9 % (ref 35.0–47.0)
HEMOGLOBIN: 12.4 g/dL (ref 12.0–16.0)
MCH: 24.5 pg — AB (ref 26.0–34.0)
MCHC: 32 g/dL (ref 32.0–36.0)
MCV: 76.6 fL — ABNORMAL LOW (ref 80.0–100.0)
PLATELETS: 348 10*3/uL (ref 150–440)
RBC: 5.08 MIL/uL (ref 3.80–5.20)
RDW: 26.7 % — AB (ref 11.5–14.5)
WBC: 6.3 10*3/uL (ref 3.6–11.0)

## 2015-01-09 LAB — SEDIMENTATION RATE: Sed Rate: 34 mm/hr — ABNORMAL HIGH (ref 0–30)

## 2015-01-09 LAB — TYPE AND SCREEN
ABO/RH(D): A NEG
Antibody Screen: NEGATIVE

## 2015-01-09 LAB — APTT: aPTT: 27 seconds (ref 24–36)

## 2015-01-09 LAB — PROTIME-INR
INR: 0.97
Prothrombin Time: 13.1 seconds (ref 11.4–15.0)

## 2015-01-09 LAB — SURGICAL PCR SCREEN
MRSA, PCR: POSITIVE — AB
STAPHYLOCOCCUS AUREUS: POSITIVE — AB

## 2015-01-09 NOTE — Patient Instructions (Signed)
  Your procedure is scheduled on: Monday January 14, 2015. Report to Same Day Surgery. To find out your arrival time please call [phone] between 1PM - 3PM on Friday January 10, 2105 .  Remember: Instructions that are not followed completely may result in serious medical risk, up to and including death, or upon the discretion of your surgeon and anesthesiologist your surgery may need to be rescheduled.    _x___ 1. Do not eat food or drink liquids after midnight. No gum chewing or hard candies.     ____ 2. No Alcohol for 24 hours before or after surgery.   ____ 3. Bring all medications with you on the day of surgery if instructed.    _x__ 4. Notify your doctor if there is any change in your medical condition     (cold, fever, infections).     Do not wear jewelry, make-up, hairpins, clips or nail polish.  Do not wear lotions, powders, or perfumes. You may wear deodorant.  Do not shave 48 hours prior to surgery. Men may shave face and neck.  Do not bring valuables to the hospital.    [facility] is not responsible for any belongings or valuables.               Contacts, dentures or bridgework may not be worn into surgery.  Leave your suitcase in the car. After surgery it may be brought to your room.  For patients admitted to the hospital, discharge time is determined by your treatment team.   Patients discharged the day of surgery will not be allowed to drive home.    Please read over the following fact sheets that you were given:   [facility] Preparing for Surgery  _x___ Take these medicines the morning of surgery with A SIP OF WATER:    1. DULoxetine (CYMBALTA)  2. Magnesium  3. pantoprazole (PROTONIX)  4.pramipexole (MIRAPEX)   ____ Fleet Enema (as directed)   __x__ Use CHG Soap as directed  ____ Use inhalers on the day of surgery  ____ Stop metformin 2 days prior to surgery    ____ Take 1/2 of usual insulin dose the night before surgery and none on the morning of  surgery.   ____ Stop Coumadin/Plavix/aspirin on does not apply. ____ Stop Anti-inflammatories on does not apply.   __x__ Stop supplements Glucosamine, Maxx Freezeuntil after surgery.    ____ Bring C-Pap to the hospital.

## 2015-01-09 NOTE — Pre-Procedure Instructions (Signed)
01/09/15 @ 1440 this RN called Dr. Clydell Hakim office to report positive MRSA and positive Staph Aureus nasal swab.  Telephone order received for Vancomycin 1 gram IVPB on call to OR preop and Mupirocin treatrment will be called into pt pharmacy.  Dr. Clydell Hakim office will contact pt regarding test results, starting the Mupirocin prescription and showering for 5 nights prior to surgery.

## 2015-01-10 ENCOUNTER — Inpatient Hospital Stay: Payer: Medicare Other

## 2015-01-10 ENCOUNTER — Ambulatory Visit: Payer: Medicare Other | Admitting: Hematology and Oncology

## 2015-01-10 ENCOUNTER — Other Ambulatory Visit: Payer: Medicare Other

## 2015-01-10 ENCOUNTER — Ambulatory Visit: Payer: Medicare Other

## 2015-01-10 ENCOUNTER — Inpatient Hospital Stay (HOSPITAL_BASED_OUTPATIENT_CLINIC_OR_DEPARTMENT_OTHER): Payer: Medicare Other | Admitting: Hematology and Oncology

## 2015-01-10 ENCOUNTER — Other Ambulatory Visit: Payer: Self-pay

## 2015-01-10 VITALS — BP 137/78 | HR 90 | Temp 95.9°F | Resp 17 | Ht 68.0 in | Wt 195.5 lb

## 2015-01-10 DIAGNOSIS — F329 Major depressive disorder, single episode, unspecified: Secondary | ICD-10-CM

## 2015-01-10 DIAGNOSIS — D649 Anemia, unspecified: Secondary | ICD-10-CM

## 2015-01-10 DIAGNOSIS — Z79899 Other long term (current) drug therapy: Secondary | ICD-10-CM | POA: Diagnosis not present

## 2015-01-10 DIAGNOSIS — R011 Cardiac murmur, unspecified: Secondary | ICD-10-CM

## 2015-01-10 DIAGNOSIS — K900 Celiac disease: Secondary | ICD-10-CM

## 2015-01-10 DIAGNOSIS — D509 Iron deficiency anemia, unspecified: Secondary | ICD-10-CM

## 2015-01-10 DIAGNOSIS — M129 Arthropathy, unspecified: Secondary | ICD-10-CM

## 2015-01-10 DIAGNOSIS — Z8781 Personal history of (healed) traumatic fracture: Secondary | ICD-10-CM

## 2015-01-10 DIAGNOSIS — K219 Gastro-esophageal reflux disease without esophagitis: Secondary | ICD-10-CM

## 2015-01-10 LAB — CBC WITH DIFFERENTIAL/PLATELET
Basophils Absolute: 0.1 10*3/uL (ref 0–0.1)
Basophils Relative: 1 %
Eosinophils Absolute: 0.2 10*3/uL (ref 0–0.7)
Eosinophils Relative: 3 %
HCT: 37.9 % (ref 35.0–47.0)
Hemoglobin: 12 g/dL (ref 12.0–16.0)
Lymphocytes Relative: 36 %
Lymphs Abs: 2 10*3/uL (ref 1.0–3.6)
MCH: 24.4 pg — ABNORMAL LOW (ref 26.0–34.0)
MCHC: 31.6 g/dL — ABNORMAL LOW (ref 32.0–36.0)
MCV: 77.1 fL — ABNORMAL LOW (ref 80.0–100.0)
Monocytes Absolute: 0.4 10*3/uL (ref 0.2–0.9)
Monocytes Relative: 7 %
Neutro Abs: 2.9 10*3/uL (ref 1.4–6.5)
Neutrophils Relative %: 53 %
Platelets: 340 10*3/uL (ref 150–440)
RBC: 4.92 MIL/uL (ref 3.80–5.20)
RDW: 26.7 % — ABNORMAL HIGH (ref 11.5–14.5)
WBC: 5.5 10*3/uL (ref 3.6–11.0)

## 2015-01-10 LAB — FERRITIN: Ferritin: 191 ng/mL (ref 11–307)

## 2015-01-10 NOTE — Progress Notes (Signed)
[facility] day:  01/10/2015  Chief Complaint: Valerie Duran is a 69 y.o. female with chronic anemia who is seen for ressessment after initiation of IV iron (Venofer).  HPI:  The patient was last seen in the medical oncology clinic on 12/13/2014.  At that time, she was seen for initial consultation.  She has celiac disease and progressive anemia.  EGD and colonoscopy in 2011 revealed no apparent abnormality.  Diet was modest.  She denied any melena or hematochezia.    Labs on 12/13/2014 revealed a hematocrit of 27.9, hemoglobin 8.5, MCV 67.9, and ferritin of 8 consistent with severe iron deficiency.  B12 and folate were normal.  TSH was normal.   Because of her symptomatology, she requested transfusion.  She received 2 units of PRBCs on 12/14/2014.  She began weekly Venofer 200 mg (06/30, 07/07, 01/03/2015).  She tolerated her infusions well.  Symptomatically, she states that she could tell a difference after her blood transfusion.  She is still tired secondary to lack of sleep.  She has trouble laying in bed secondary to her hip fracture.  She is trying to walk with a walker.  She uses a wheelchair when she goes somewhere.  Her surgery is scheduled for Monday, 01/14/2015.  Past Medical History  Diagnosis Date  . Anemia   . Heart murmur   . Arthritis   . Celiac disease   . Depression   . GERD (gastroesophageal reflux disease)     Past Surgical History  Procedure Laterality Date  . Abdominal hysterectomy    . Joint replacement Bilateral 2014    knees  . Hip fracture surgery Right 2016  . Foot surgery Right 2007  . Cataract extraction w/ intraocular lens  implant, bilateral Bilateral   . Cesarean section [place]  . Back surgery      spinal fusion  . Neck surgery      fusion    Family History  Problem Relation Age of Onset  . Cancer Father 76    Colon Cancer    Social History:  reports that she has never smoked. She does  not have any smokeless tobacco history on file. She reports that she does not drink alcohol or use illicit drugs.  The patient is accompanied by her husband, Fulton Reek.  Allergies:  Allergies  Allergen Reactions  . Ambien [Zolpidem Tartrate] Other (See Comments)    Altered mental status  . Barley Grass Other (See Comments)    Celiac disease  . Codeine Nausea Only  . Tape Other (See Comments)    Adhesive tape - tears skin  . Valium [Diazepam] Other (See Comments)    Sleep walks  . Ibuprofen Other (See Comments) and Rash    GI upset " makes my stomach hurt"  . Simvastatin Rash  . Wheat Bran Rash    Celiac disease    Current Medications: Current Outpatient Prescriptions  Medication Sig Dispense Refill  . Calcium Carbonate-Vitamin D (CALCIUM 600 + D PO) Take 1 tablet by mouth 2 (two) times daily.     . Calcium-Vitamin D 600-200 MG-UNIT per tablet Take by mouth.    . DULoxetine (CYMBALTA) 60 MG capsule Take 60 mg by mouth every morning.     Marland Kitchen GLUCOSAMINE HCL PO Take 2 tablets by mouth every morning.     Marland Kitchen HYDROcodone-acetaminophen (LORTAB) 10-500 MG per tablet Take 1 tablet by mouth every 4 (four) hours as needed (0.5-1 tab as  needed for pain). For pain    . Magnesium 500 MG CAPS Take 1 capsule by mouth every morning.     . Menthol, Topical Analgesic, (ZIMS MAX-FREEZE EX) Apply 1 spray topically 4 (four) times daily as needed.    . pantoprazole (PROTONIX) 40 MG tablet qam    . potassium chloride (K-DUR,KLOR-CON) 10 MEQ tablet Take 10 mEq by mouth 2 (two) times daily.    . pramipexole (MIRAPEX) 1 MG tablet Take 1 tablet by mouth 2 (two) times daily.     Marland Kitchen torsemide (DEMADEX) 10 MG tablet Take 10 mg by mouth every morning.     No current facility-administered medications for this visit.   Facility-Administered Medications Ordered in Other Visits  Medication Dose Route Frequency Provider Last Rate Last Dose  . 0.9 %  sodium chloride infusion   Intravenous Once Lequita Asal, MD         Review of Systems:  GENERAL:  Still tired.  No fevers, sweats or weight loss.  Weight fluctuates up and down. PERFORMANCE STATUS (ECOG): 1-2. HEENT:  No visual changes, runny nose, sore throat, mouth sores or tenderness. Lungs: No shortness of breath or cough.  No hemoptysis. Cardiac:  No chest pain, palpitations, orthopnea, or PND. GI:  No nausea, vomiting, diarrhea, constipation, melena or hematochezia. GU:  No urgency, frequency, dysuria, or hematuria. Musculoskeletal: Back and hip pain.  Difficulty ambulating secondary to pain.  Arthritis in joints.  No muscle tenderness. Extremities:  No pain or swelling. Skin:  No rashes or skin changes. Neuro:  No headache, numbness or weakness, balance or coordination issues. Endocrine:  No diabetes, thyroid issues, hot flashes or night sweats. Psych:  No mood changes, depression or anxiety. Pain:  No focal pain. Review of systems:  All other systems reviewed and found to be negative.  Physical Exam: Blood pressure 137/78, pulse 90, temperature 95.9 F (35.5 C), temperature source Tympanic, resp. rate 17, height _0  (1.727 m), weight 195 lb 8 oz (88.678 kg). GENERAL:  Chronically fatigued appearing woman sitting comfortably in a wheelchair in the exam room in no acute distress. MENTAL STATUS:  Alert and oriented to person, place and time. HEAD:  Shoulder length brown hair.  Normocephalic, atraumatic, face symmetric, no Cushingoid features. EYES:  Blue eyes.  Pupils equal round and reactive to light and accomodation.  Amblyopia.  No conjunctivitis or scleral icterus. ENT:  Oropharynx clear without lesion.  Tongue normal. Mucous membranes moist.  RESPIRATORY:  Clear to auscultation without rales, wheezes or rhonchi. CARDIOVASCULAR:  Regular rate and rhythm without murmur, rub or gallop. ABDOMEN:  Soft, non-tender, with active bowel sounds, and no hepatosplenomegaly.  No masses. SKIN:  No rashes, ulcers or lesions. EXTREMITIES: Chronic mild  lower extremity edema.  No skin discoloration or tenderness.  No palpable cords. LYMPH NODES: No palpable cervical, supraclavicular, axillary or inguinal adenopathy  NEUROLOGICAL: Unremarkable. PSYCH:  Appropriate.   Appointment on 01/10/2015  Component Date Value Ref Range Status  . WBC 01/10/2015 5.5  3.6 - 11.0 K/uL Final  . RBC 01/10/2015 4.92  3.80 - 5.20 MIL/uL Final  . Hemoglobin 01/10/2015 12.0  12.0 - 16.0 g/dL Final  . HCT 01/10/2015 37.9  35.0 - 47.0 % Final  . MCV 01/10/2015 77.1* 80.0 - 100.0 fL Final  . MCH 01/10/2015 24.4* 26.0 - 34.0 pg Final  . MCHC 01/10/2015 31.6* 32.0 - 36.0 g/dL Final  . RDW 01/10/2015 26.7* 11.5 - 14.5 % Final  . Platelets 01/10/2015 340  150 - 440 K/uL Final  . Neutrophils Relative % 01/10/2015 53   Final  . Neutro Abs 01/10/2015 2.9  1.4 - 6.5 K/uL Final  . Lymphocytes Relative 01/10/2015 36   Final  . Lymphs Abs 01/10/2015 2.0  1.0 - 3.6 K/uL Final  . Monocytes Relative 01/10/2015 7   Final  . Monocytes Absolute 01/10/2015 0.4  0.2 - 0.9 K/uL Final  . Eosinophils Relative 01/10/2015 3   Final  . Eosinophils Absolute 01/10/2015 0.2  0 - 0.7 K/uL Final  . Basophils Relative 01/10/2015 1   Final  . Basophils Absolute 01/10/2015 0.1  0 - 0.1 K/uL Final  . Ferritin 01/10/2015 191  11 - 307 ng/mL Final  Hospital Outpatient Visit on 01/09/2015  Component Date Value Ref Range Status  . WBC 01/09/2015 6.3  3.6 - 11.0 K/uL Final  . RBC 01/09/2015 5.08  3.80 - 5.20 MIL/uL Final  . Hemoglobin 01/09/2015 12.4  12.0 - 16.0 g/dL Final  . HCT 01/09/2015 38.9  35.0 - 47.0 % Final  . MCV 01/09/2015 76.6* 80.0 - 100.0 fL Final  . MCH 01/09/2015 24.5* 26.0 - 34.0 pg Final  . MCHC 01/09/2015 32.0  32.0 - 36.0 g/dL Final  . RDW 01/09/2015 26.7* 11.5 - 14.5 % Final  . Platelets 01/09/2015 348  150 - 440 K/uL Final  . Sed Rate 01/09/2015 34* 0 - 30 mm/hr Final  . Sodium 01/09/2015 138  135 - 145 mmol/L Final  . Potassium 01/09/2015 3.5  3.5 - 5.1 mmol/L Final   . Chloride 01/09/2015 99* 101 - 111 mmol/L Final  . CO2 01/09/2015 30  22 - 32 mmol/L Final  . Glucose, Bld 01/09/2015 151* 65 - 99 mg/dL Final  . BUN 01/09/2015 10  6 - 20 mg/dL Final  . Creatinine, Ser 01/09/2015 0.70  0.44 - 1.00 mg/dL Final  . Calcium 01/09/2015 9.6  8.9 - 10.3 mg/dL Final  . GFR calc non Af Amer 01/09/2015 >60  >60 mL/min Final  . GFR calc Af Amer 01/09/2015 >60  >60 mL/min Final   Comment: (NOTE) The eGFR has been calculated using the CKD EPI equation. This calculation has not been validated in all clinical situations. eGFR's persistently <60 mL/min signify possible Chronic Kidney Disease.   . Anion gap 01/09/2015 9  5 - 15 Final  . Prothrombin Time 01/09/2015 13.1  11.4 - 15.0 seconds Final  . INR 01/09/2015 0.97   Final  . aPTT 01/09/2015 27  24 - 36 seconds Final  . Color, Urine 01/09/2015 STRAW* YELLOW Final  . APPearance 01/09/2015 CLEAR* CLEAR Final  . Glucose, UA 01/09/2015 NEGATIVE  NEGATIVE mg/dL Final  . Bilirubin Urine 01/09/2015 NEGATIVE  NEGATIVE Final  . Ketones, ur 01/09/2015 NEGATIVE  NEGATIVE mg/dL Final  . Specific Gravity, Urine 01/09/2015 1.008  1.005 - 1.030 Final  . Hgb urine dipstick 01/09/2015 2+* NEGATIVE Final  . pH 01/09/2015 5.0  5.0 - 8.0 Final  . Protein, ur 01/09/2015 NEGATIVE  NEGATIVE mg/dL Final  . Nitrite 01/09/2015 NEGATIVE  NEGATIVE Final  . Leukocytes, UA 01/09/2015 1+* NEGATIVE Final  . RBC / HPF 01/09/2015 0-5  0 - 5 RBC/hpf Final  . WBC, UA 01/09/2015 0-5  0 - 5 WBC/hpf Final  . Bacteria, UA 01/09/2015 NONE SEEN  NONE SEEN Final  . Squamous Epithelial / LPF 01/09/2015 0-5* NONE SEEN Final  . Specimen Description 01/09/2015 URINE, CLEAN CATCH   Final  . Special Requests 01/09/2015 NONE   Final  .  Culture 01/09/2015 HOLDING FOR POSSIBLE PATHOGEN   Final  . Report Status 01/09/2015 PENDING   Incomplete  . MRSA, PCR 01/09/2015 POSITIVE* NEGATIVE Final  . Staphylococcus aureus 01/09/2015 POSITIVE* NEGATIVE Final    Comment:        The Xpert SA Assay (FDA approved for NASAL specimens in patients over 53 years of age), is one component of a comprehensive surveillance program.  Test performance has been validated by [facility] for patients greater than or equal to 52 year old. It is not intended to diagnose infection nor to guide or monitor treatment. CRITICAL RESULT CALLED TO, READ BACK BY AND VERIFIED WITH: GAYLE GRAVE AT 1312 ON 01/09/15 BY TB.   . ABO/RH(D) 01/09/2015 A NEG   Final  . Antibody Screen 01/09/2015 NEG   Final  . Sample Expiration 01/09/2015 01/12/2015   Final    Assessment:  Valerie Duran is a 69 y.o. female with celiac disease and difficulty absorbing nutrients.  She has a history of progressive anemia.  EGD and colonoscopy in 2011 revealed no apparent abnormality.  Diet is modest.  She denies any melena or hematochezia.  Labs on 12/13/2014 revealed severe iron deficiency with a hematocrit of 27.9, hemoglobin 8.5, MCV 67.9, and ferritin of 8.    She received 2 units of PRBCs on 12/14/2014.  She received weekly Venofer 200 mg (06/30, 07/07, 01/03/2015).  She tolerated her infusions well.  Hematocrit is 37.9 with a ferritin of 191 today.  She fell on 08/05/2014 and fractured her hip.  Surgery is scheduled for 01/14/2015.  Symptomatically, she remains fatigued.  Exam is unremakable.  Plan: 1.  Review today's labs.   2.  As ferritin > 100 (goal met), no further IV iron. 3.  Anticipate surgery on 01/14/2015. 4.  RTC in 3 months for labs (CBC with diff, ferritin). 5.  RTC in 6 months for MD assessment and labs (CBC with diff, ferritin, and iron studies).   Lequita Asal, MD  01/10/2015

## 2015-01-12 LAB — URINE CULTURE: Culture: 70000

## 2015-01-13 MED ORDER — VANCOMYCIN HCL IN DEXTROSE 1-5 GM/200ML-% IV SOLN
1000.0000 mg | Freq: Once | INTRAVENOUS | Status: AC
Start: 2015-01-13 — End: 2015-01-14
  Administered 2015-01-14: 1000 mg via INTRAVENOUS

## 2015-01-13 MED ORDER — CEFAZOLIN SODIUM-DEXTROSE 2-3 GM-% IV SOLR
2.0000 g | Freq: Once | INTRAVENOUS | Status: AC
  Administered 2015-01-14 (×2): 2 g via INTRAVENOUS

## 2015-01-14 ENCOUNTER — Encounter: Payer: Self-pay | Admitting: *Deleted

## 2015-01-14 ENCOUNTER — Inpatient Hospital Stay
Admission: RE | Admit: 2015-01-14 | Discharge: 2015-01-17 | DRG: 470 | Disposition: A | Discharge status: Home / Self Care | Payer: Medicare Other | Source: Ambulatory Visit | Attending: Orthopedic Surgery | Admitting: Orthopedic Surgery

## 2015-01-14 ENCOUNTER — Encounter
Admission: RE | Disposition: A | Discharge status: Home / Self Care | Payer: Self-pay | Source: Ambulatory Visit | Attending: Orthopedic Surgery

## 2015-01-14 ENCOUNTER — Inpatient Hospital Stay: Payer: Medicare Other | Admitting: Certified Registered"

## 2015-01-14 ENCOUNTER — Inpatient Hospital Stay: Payer: Medicare Other

## 2015-01-14 DIAGNOSIS — X58XXXD Exposure to other specified factors, subsequent encounter: Secondary | ICD-10-CM

## 2015-01-14 DIAGNOSIS — M1990 Unspecified osteoarthritis, unspecified site: Secondary | ICD-10-CM | POA: Diagnosis present

## 2015-01-14 DIAGNOSIS — D62 Acute posthemorrhagic anemia: Secondary | ICD-10-CM | POA: Diagnosis not present

## 2015-01-14 DIAGNOSIS — Z96649 Presence of unspecified artificial hip joint: Secondary | ICD-10-CM

## 2015-01-14 DIAGNOSIS — D509 Iron deficiency anemia, unspecified: Secondary | ICD-10-CM | POA: Diagnosis present

## 2015-01-14 DIAGNOSIS — S72001K Fracture of unspecified part of neck of right femur, subsequent encounter for closed fracture with nonunion: Principal | ICD-10-CM

## 2015-01-14 DIAGNOSIS — Z79899 Other long term (current) drug therapy: Secondary | ICD-10-CM | POA: Diagnosis not present

## 2015-01-14 DIAGNOSIS — K219 Gastro-esophageal reflux disease without esophagitis: Secondary | ICD-10-CM | POA: Diagnosis present

## 2015-01-14 DIAGNOSIS — K900 Celiac disease: Secondary | ICD-10-CM | POA: Diagnosis present

## 2015-01-14 DIAGNOSIS — Z96653 Presence of artificial knee joint, bilateral: Secondary | ICD-10-CM | POA: Diagnosis present

## 2015-01-14 DIAGNOSIS — F329 Major depressive disorder, single episode, unspecified: Secondary | ICD-10-CM | POA: Diagnosis present

## 2015-01-14 HISTORY — PX: HARDWARE REMOVAL: SHX979

## 2015-01-14 HISTORY — PX: TOTAL HIP ARTHROPLASTY: SHX124

## 2015-01-14 SURGERY — ARTHROPLASTY, HIP, TOTAL,POSTERIOR APPROACH
Anesthesia: Spinal | Site: Hip | Laterality: Right | Wound class: Clean

## 2015-01-14 MED ORDER — ACETAMINOPHEN 325 MG PO TABS
650.0000 mg | ORAL_TABLET | Freq: Four times a day (QID) | ORAL | Status: DC | PRN
Start: 2015-01-14 — End: 2015-01-17

## 2015-01-14 MED ORDER — ACETAMINOPHEN 10 MG/ML IV SOLN
INTRAVENOUS | Status: DC | PRN
  Administered 2015-01-14: 1000 mg via INTRAVENOUS

## 2015-01-14 MED ORDER — PRAMIPEXOLE DIHYDROCHLORIDE 0.25 MG PO TABS
1.0000 mg | ORAL_TABLET | Freq: Two times a day (BID) | ORAL | Status: DC
Start: 2015-01-14 — End: 2015-01-17
  Administered 2015-01-14 – 2015-01-17 (×6): 1 mg via ORAL
  Filled 2015-01-14 (×6): qty 4

## 2015-01-14 MED ORDER — SENNOSIDES-DOCUSATE SODIUM 8.6-50 MG PO TABS
1.0000 | ORAL_TABLET | Freq: Two times a day (BID) | ORAL | Status: DC
  Administered 2015-01-14 – 2015-01-16 (×5): 1 via ORAL
  Filled 2015-01-14 (×6): qty 1

## 2015-01-14 MED ORDER — FERROUS SULFATE 325 (65 FE) MG PO TABS
325.0000 mg | ORAL_TABLET | Freq: Two times a day (BID) | ORAL | Status: DC
  Administered 2015-01-15 – 2015-01-17 (×5): 325 mg via ORAL
  Filled 2015-01-14 (×5): qty 1

## 2015-01-14 MED ORDER — MORPHINE SULFATE 2 MG/ML IJ SOLN
2.0000 mg | INTRAMUSCULAR | Status: DC | PRN
  Administered 2015-01-14 (×2): 2 mg via INTRAVENOUS
  Administered 2015-01-15 (×2): 4 mg via INTRAVENOUS
  Filled 2015-01-14: qty 2
  Filled 2015-01-14: qty 1
  Filled 2015-01-14: qty 2
  Filled 2015-01-14: qty 1

## 2015-01-14 MED ORDER — FENTANYL CITRATE (PF) 100 MCG/2ML IJ SOLN: 25.0000 ug | INTRAMUSCULAR | Status: DC | PRN

## 2015-01-14 MED ORDER — ENOXAPARIN SODIUM 30 MG/0.3ML ~~LOC~~ SOLN
30.0000 mg | Freq: Two times a day (BID) | SUBCUTANEOUS | Status: DC
  Administered 2015-01-15 – 2015-01-17 (×5): 30 mg via SUBCUTANEOUS
  Filled 2015-01-14 (×5): qty 0.3

## 2015-01-14 MED ORDER — LACTATED RINGERS IV SOLN
INTRAVENOUS | Status: DC
  Administered 2015-01-14 (×3): via INTRAVENOUS

## 2015-01-14 MED ORDER — VANCOMYCIN HCL IN DEXTROSE 1-5 GM/200ML-% IV SOLN
INTRAVENOUS | Status: AC
  Administered 2015-01-14: 1000 mg via INTRAVENOUS
  Filled 2015-01-14: qty 200

## 2015-01-14 MED ORDER — PHENYLEPHRINE HCL 10 MG/ML IJ SOLN
INTRAMUSCULAR | Status: DC | PRN
  Administered 2015-01-14 (×16): 100 ug via INTRAVENOUS

## 2015-01-14 MED ORDER — METOCLOPRAMIDE HCL 10 MG PO TABS
10.0000 mg | ORAL_TABLET | Freq: Three times a day (TID) | ORAL | Status: AC
  Administered 2015-01-14 – 2015-01-16 (×7): 10 mg via ORAL
  Filled 2015-01-14 (×7): qty 1

## 2015-01-14 MED ORDER — MAGNESIUM OXIDE 400 (241.3 MG) MG PO TABS
400.0000 mg | ORAL_TABLET | ORAL | Status: DC
  Administered 2015-01-15 – 2015-01-17 (×3): 400 mg via ORAL
  Filled 2015-01-14 (×4): qty 1

## 2015-01-14 MED ORDER — BUPIVACAINE HCL (PF) 0.5 % IJ SOLN
INTRAMUSCULAR | Status: DC | PRN
  Administered 2015-01-14: 2 mL via INTRATHECAL

## 2015-01-14 MED ORDER — ACETAMINOPHEN 10 MG/ML IV SOLN
INTRAVENOUS | Status: AC
Start: 2015-01-14 — End: 2015-01-14
  Filled 2015-01-14: qty 100

## 2015-01-14 MED ORDER — ONDANSETRON HCL 4 MG/2ML IJ SOLN: 4.0000 mg | Freq: Once | INTRAMUSCULAR | Status: DC | PRN

## 2015-01-14 MED ORDER — TRAMADOL HCL 50 MG PO TABS
50.0000 mg | ORAL_TABLET | ORAL | Status: DC | PRN
  Administered 2015-01-14: 50 mg via ORAL
  Filled 2015-01-14: qty 1

## 2015-01-14 MED ORDER — SODIUM CHLORIDE 0.9 % IV SOLN
INTRAVENOUS | Status: DC
  Administered 2015-01-14: 20:00:00 via INTRAVENOUS

## 2015-01-14 MED ORDER — OXYCODONE HCL 5 MG PO TABS
5.0000 mg | ORAL_TABLET | ORAL | Status: DC | PRN
  Administered 2015-01-15 (×2): 10 mg via ORAL
  Administered 2015-01-15: 5 mg via ORAL
  Administered 2015-01-15 (×3): 10 mg via ORAL
  Administered 2015-01-15: 5 mg via ORAL
  Administered 2015-01-15 – 2015-01-16 (×2): 10 mg via ORAL
  Administered 2015-01-16: 5 mg via ORAL
  Administered 2015-01-16 – 2015-01-17 (×4): 10 mg via ORAL
  Filled 2015-01-14 (×2): qty 2
  Filled 2015-01-14: qty 1
  Filled 2015-01-14 (×2): qty 2
  Filled 2015-01-14: qty 1
  Filled 2015-01-14 (×6): qty 2
  Filled 2015-01-14: qty 1
  Filled 2015-01-14: qty 2

## 2015-01-14 MED ORDER — FENTANYL CITRATE (PF) 100 MCG/2ML IJ SOLN
INTRAMUSCULAR | Status: DC | PRN
  Administered 2015-01-14: 50 ug via INTRAVENOUS

## 2015-01-14 MED ORDER — ONDANSETRON HCL 4 MG PO TABS: 4.0000 mg | ORAL_TABLET | Freq: Four times a day (QID) | ORAL | Status: DC | PRN

## 2015-01-14 MED ORDER — NEOMYCIN-POLYMYXIN B GU 40-200000 IR SOLN
Status: DC | PRN
  Administered 2015-01-14: 14 mL

## 2015-01-14 MED ORDER — ONDANSETRON HCL 4 MG/2ML IJ SOLN
4.0000 mg | Freq: Four times a day (QID) | INTRAMUSCULAR | Status: DC | PRN
  Administered 2015-01-16: 4 mg via INTRAVENOUS
  Filled 2015-01-14: qty 2

## 2015-01-14 MED ORDER — LIDOCAINE HCL (PF) 2 % IJ SOLN
INTRAMUSCULAR | Status: DC | PRN
  Administered 2015-01-14: 50 mg

## 2015-01-14 MED ORDER — ACETAMINOPHEN 650 MG RE SUPP: 650.0000 mg | Freq: Four times a day (QID) | RECTAL | Status: DC | PRN

## 2015-01-14 MED ORDER — TETRACAINE HCL 1 % IJ SOLN
INTRAMUSCULAR | Status: DC | PRN
  Administered 2015-01-14: 8 mg via INTRASPINAL

## 2015-01-14 MED ORDER — PROPOFOL INFUSION 10 MG/ML OPTIME
INTRAVENOUS | Status: DC | PRN
  Administered 2015-01-14: 50 ug/kg/min via INTRAVENOUS

## 2015-01-14 MED ORDER — PROMETHAZINE HCL 25 MG/ML IJ SOLN
12.5000 mg | Freq: Once | INTRAMUSCULAR | Status: AC
  Administered 2015-01-14: 12.5 mg via INTRAVENOUS
  Filled 2015-01-14: qty 1

## 2015-01-14 MED ORDER — DIPHENHYDRAMINE HCL 12.5 MG/5ML PO ELIX
12.5000 mg | ORAL_SOLUTION | ORAL | Status: DC | PRN
Start: 2015-01-14 — End: 2015-01-17

## 2015-01-14 MED ORDER — EPHEDRINE SULFATE 50 MG/ML IJ SOLN
INTRAMUSCULAR | Status: DC | PRN
  Administered 2015-01-14: 10 mg via INTRAVENOUS
  Administered 2015-01-14: 5 mg via INTRAVENOUS
  Administered 2015-01-14: 10 mg via INTRAVENOUS

## 2015-01-14 MED ORDER — MENTHOL 3 MG MT LOZG: 1.0000 | LOZENGE | OROMUCOSAL | Status: DC | PRN

## 2015-01-14 MED ORDER — TORSEMIDE 20 MG PO TABS
10.0000 mg | ORAL_TABLET | ORAL | Status: DC
  Administered 2015-01-15: 10 mg via ORAL
  Filled 2015-01-14 (×2): qty 1
  Filled 2015-01-14: qty 2

## 2015-01-14 MED ORDER — FLEET ENEMA 7-19 GM/118ML RE ENEM: 1.0000 | ENEMA | Freq: Once | RECTAL | Status: AC | PRN

## 2015-01-14 MED ORDER — TETRACAINE HCL 1 % IJ SOLN
INTRAMUSCULAR | Status: AC
  Filled 2015-01-14: qty 2

## 2015-01-14 MED ORDER — CEFAZOLIN SODIUM-DEXTROSE 2-3 GM-% IV SOLR
2.0000 g | Freq: Four times a day (QID) | INTRAVENOUS | Status: AC
  Administered 2015-01-14 – 2015-01-15 (×4): 2 g via INTRAVENOUS
  Filled 2015-01-14 (×4): qty 50

## 2015-01-14 MED ORDER — ACETAMINOPHEN 10 MG/ML IV SOLN
1000.0000 mg | Freq: Four times a day (QID) | INTRAVENOUS | Status: AC
  Administered 2015-01-14 – 2015-01-15 (×4): 1000 mg via INTRAVENOUS
  Filled 2015-01-14 (×4): qty 100

## 2015-01-14 MED ORDER — NEOMYCIN-POLYMYXIN B GU 40-200000 IR SOLN
Status: AC
  Filled 2015-01-14: qty 20

## 2015-01-14 MED ORDER — DULOXETINE HCL 30 MG PO CPEP
60.0000 mg | ORAL_CAPSULE | ORAL | Status: DC
  Administered 2015-01-15 – 2015-01-17 (×3): 60 mg via ORAL
  Filled 2015-01-14 (×3): qty 2

## 2015-01-14 MED ORDER — PHENOL 1.4 % MT LIQD: 1.0000 | OROMUCOSAL | Status: DC | PRN

## 2015-01-14 MED ORDER — MEPERIDINE HCL 25 MG/ML IJ SOLN
25.0000 mg | Freq: Once | INTRAMUSCULAR | Status: AC
  Administered 2015-01-14: 25 mg via INTRAVENOUS
  Filled 2015-01-14: qty 1

## 2015-01-14 MED ORDER — OXYCODONE HCL ER 10 MG PO T12A
10.0000 mg | EXTENDED_RELEASE_TABLET | Freq: Two times a day (BID) | ORAL | Status: DC
  Administered 2015-01-14 – 2015-01-16 (×5): 10 mg via ORAL
  Filled 2015-01-14 (×5): qty 1

## 2015-01-14 MED ORDER — POTASSIUM CHLORIDE CRYS ER 10 MEQ PO TBCR
10.0000 meq | EXTENDED_RELEASE_TABLET | Freq: Two times a day (BID) | ORAL | Status: DC
  Administered 2015-01-14 – 2015-01-16 (×4): 10 meq via ORAL
  Filled 2015-01-14 (×4): qty 1

## 2015-01-14 MED ORDER — MIDAZOLAM HCL 5 MG/5ML IJ SOLN
INTRAMUSCULAR | Status: DC | PRN
  Administered 2015-01-14: 2 mg via INTRAVENOUS

## 2015-01-14 MED ORDER — MAGNESIUM HYDROXIDE 400 MG/5ML PO SUSP: 30.0000 mL | Freq: Every day | ORAL | Status: DC | PRN

## 2015-01-14 MED ORDER — ALUM & MAG HYDROXIDE-SIMETH 200-200-20 MG/5ML PO SUSP
30.0000 mL | ORAL | Status: DC | PRN
Start: 2015-01-14 — End: 2015-01-17

## 2015-01-14 MED ORDER — CEFAZOLIN SODIUM-DEXTROSE 2-3 GM-% IV SOLR
INTRAVENOUS | Status: AC
  Filled 2015-01-14: qty 50

## 2015-01-14 MED ORDER — OXYCODONE HCL 5 MG PO TABS
5.0000 mg | ORAL_TABLET | ORAL | Status: DC | PRN
  Administered 2015-01-14 (×2): 5 mg via ORAL
  Filled 2015-01-14 (×2): qty 1

## 2015-01-14 MED ORDER — CALCIUM CARBONATE-VITAMIN D 500-200 MG-UNIT PO TABS
1.0000 | ORAL_TABLET | Freq: Two times a day (BID) | ORAL | Status: DC
  Administered 2015-01-14 – 2015-01-17 (×6): 1 via ORAL
  Filled 2015-01-14 (×11): qty 1

## 2015-01-14 MED ORDER — BISACODYL 10 MG RE SUPP: 10.0000 mg | Freq: Every day | RECTAL | Status: DC | PRN

## 2015-01-14 SURGICAL SUPPLY — 52 items
BLADE DRUM FLTD (BLADE) ×3 IMPLANT
BLADE SAW 1 (BLADE) ×3 IMPLANT
CANISTER SUCT 1200ML W/VALVE (MISCELLANEOUS) ×3 IMPLANT
CANISTER SUCT 3000ML (MISCELLANEOUS) ×6 IMPLANT
CAPT HIP TOTAL 2 ×2 IMPLANT
CARTRIDGE OIL MAESTRO DRILL (MISCELLANEOUS) ×1 IMPLANT
CATH FOL LEG HOLDER (MISCELLANEOUS) ×3 IMPLANT
CATH TRAY 16F METER LATEX (MISCELLANEOUS) ×3 IMPLANT
DIFFUSER MAESTRO (MISCELLANEOUS) ×3 IMPLANT
DRAPE INCISE IOBAN 66X60 STRL (DRAPES) ×3 IMPLANT
DRAPE SHEET LG 3/4 BI-LAMINATE (DRAPES) ×3 IMPLANT
DRAPE TABLE BACK 80X90 (DRAPES) ×3 IMPLANT
DRSG DERMACEA 8X12 NADH (GAUZE/BANDAGES/DRESSINGS) ×3 IMPLANT
DRSG OPSITE POSTOP 3X4 (GAUZE/BANDAGES/DRESSINGS) ×3 IMPLANT
DRSG OPSITE POSTOP 4X12 (GAUZE/BANDAGES/DRESSINGS) ×3 IMPLANT
DRSG OPSITE POSTOP 4X14 (GAUZE/BANDAGES/DRESSINGS) ×3 IMPLANT
DRSG TEGADERM 4X4.75 (GAUZE/BANDAGES/DRESSINGS) ×3 IMPLANT
DURAPREP 26ML APPLICATOR (WOUND CARE) ×3 IMPLANT
ELECT BLADE 6.5 EXT (BLADE) ×3 IMPLANT
ELECT CAUTERY BLADE 6.4 (BLADE) ×3 IMPLANT
GLOVE BIOGEL M STRL SZ7.5 (GLOVE) ×3 IMPLANT
GLOVE INDICATOR 8.0 STRL GRN (GLOVE) ×3 IMPLANT
GLOVE SURG 9.0 ORTHO LTXF (GLOVE) ×3 IMPLANT
GLOVE SURG ORTHO 9.0 STRL STRW (GLOVE) ×3 IMPLANT
GOWN STRL REUS W/ TWL LRG LVL3 (GOWN DISPOSABLE) ×1 IMPLANT
GOWN STRL REUS W/TWL 2XL LVL3 (GOWN DISPOSABLE) ×3 IMPLANT
GOWN STRL REUS W/TWL LRG LVL3 (GOWN DISPOSABLE) ×3
GOWN STRL REUS W/TWL XL LVL4 (GOWN DISPOSABLE) ×3 IMPLANT
HANDPIECE SUCTION TUBG SURGILV (MISCELLANEOUS) ×3 IMPLANT
HEMOVAC 400CC 10FR (MISCELLANEOUS) ×3 IMPLANT
HOOD PEEL AWAY FACE SHEILD DIS (HOOD) ×6 IMPLANT
KIT RM TURNOVER STRD PROC AR (KITS) ×3 IMPLANT
NDL SAFETY 18GX1.5 (NEEDLE) ×3 IMPLANT
NS IRRIG 1000ML POUR BTL (IV SOLUTION) ×3 IMPLANT
OIL CARTRIDGE MAESTRO DRILL (MISCELLANEOUS) ×3
PACK HIP PROSTHESIS (MISCELLANEOUS) ×3 IMPLANT
SOL .9 NS 3000ML IRR  AL (IV SOLUTION) ×2
SOL .9 NS 3000ML IRR AL (IV SOLUTION) ×1
SOL .9 NS 3000ML IRR UROMATIC (IV SOLUTION) ×1 IMPLANT
SOL PREP PVP 2OZ (MISCELLANEOUS) ×3
SOLUTION PREP PVP 2OZ (MISCELLANEOUS) ×1 IMPLANT
SPONGE DRAIN TRACH 4X4 STRL 2S (GAUZE/BANDAGES/DRESSINGS) ×3 IMPLANT
STAPLER SKIN PROX 35W (STAPLE) ×3 IMPLANT
SUT ETHIBOND #5 BRAIDED 30INL (SUTURE) ×3 IMPLANT
SUT VIC AB 0 CT1 36 (SUTURE) ×3 IMPLANT
SUT VIC AB 1 CT1 36 (SUTURE) ×6 IMPLANT
SUT VIC AB 2-0 CT1 27 (SUTURE) ×3
SUT VIC AB 2-0 CT1 TAPERPNT 27 (SUTURE) ×1 IMPLANT
SYR 20CC LL (SYRINGE) ×3 IMPLANT
TAPE ADH 3 LX (MISCELLANEOUS) ×3 IMPLANT
TAPE TRANSPORE STRL 2 31045 (GAUZE/BANDAGES/DRESSINGS) ×3 IMPLANT
WATER STERILE IRR 1000ML POUR (IV SOLUTION) ×2 IMPLANT

## 2015-01-14 NOTE — Anesthesia Procedure Notes (Signed)
Spinal Patient location during procedure: OR Start time: 01/14/2015 1:28 PM End time: 01/14/2015 1:33 PM Staffing Anesthesiologist: Marline Backbone F Resident/CRNA: Rolla Plate Performed by: resident/CRNA  Preanesthetic Checklist Completed: patient identified, site marked, surgical consent, pre-op evaluation, timeout performed, IV checked, risks and benefits discussed and monitors and equipment checked Spinal Block Patient position: sitting Prep: Betadine and site prepped and draped Patient monitoring: heart rate, continuous pulse ox, blood pressure and cardiac monitor Approach: midline Location: L2-3 Injection technique: single-shot Needle Needle type: Whitacre and Introducer  Needle gauge: 24 G Needle length: 9 cm Additional Notes Negative paresthesia. Negative blood return. Positive free-flowing CSF. Expiration date of kit checked and confirmed. Patient tolerated procedure well, without complications.

## 2015-01-14 NOTE — H&P (Signed)
The patient has been re-examined, and the chart reviewed, and there have been no interval changes to the documented history and physical.    The risks, benefits, and alternatives have been discussed at length. The patient expressed understanding of the risks benefits and agreed with plans for surgical intervention.  Tanay Massiah P. Felesia Stahlecker, Jr. M.D.    

## 2015-01-14 NOTE — Op Note (Signed)
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 M.D.  ASSISTANT:  Vance Peper, PA (present and scrubbed throughout the case, critical for assistance with exposure, retraction, instrumentation, and closure)  ANESTHESIA: spinal  ESTIMATED BLOOD LOSS: 800 mL  FLUIDS REPLACED: 2300 mL of crystalloid  DRAINS: 2 medium drains to a Hemovac reservoir  IMPLANTS UTILIZED: DePuy 12 mm small stature AML femoral stem, 54 mm OD Pinnacle Gription Sector acetabular component, neutral Pinnacle Altrx polyethylene insert, and a 36 mm M-SPEC -2 mm hip ball, 6.5 mm x 25 mm cancellous screw  INDICATIONS FOR SURGERY: Valerie Duran is a 69 y.o. year old female who sustained a right femoral neck fracture several months ago. She was treated with percutaneous pinning of the femoral neck fracture. Radiographs demonstrated significant compression of the fracture site with prominence of the screw heads and shortening. She was having significant pain and there was a concern of avascular necrosis versus nonunion of the fracture site. After discussion of the risks and benefits of surgical intervention, the patient expressed understanding of the risks benefits and agree with plans for conversion to a total hip arthroplasty.   The risks, benefits, and alternatives were discussed at length including but not limited to the risks of infection, bleeding, nerve injury, stiffness, blood clots, the need for revision surgery, limb length inequality, dislocation, cardiopulmonary complications, among others, and they were willing to  proceed.  PROCEDURE IN DETAIL: The patient was brought into the operating room and, after adequate spinal anesthesia was achieved, the patient was placed in a left lateral decubitus position. Axillary roll was placed and all bony prominences were well-padded. The patient's right hip was cleaned and prepped with alcohol and DuraPrep and draped in the usual sterile fashion. A "timeout" was performed as per usual protocol. A lateral curvilinear incision was made gently curving towards the posterior superior iliac spine. The IT band was incised in line with the skin incision and the fibers of the gluteus maximus were split in line. The three 7.3 mm cannulated screw heads were easily visible with prominent bursa formation noted. The bursa was excised and the 3 cannulated screws were removed without difficulty. The piriformis tendon was identified, skeletonized, and incised at its insertion to the proximal femur and reflected posteriorly. A T type posterior capsulotomy was performed. Prior to dislocation of the femoral head, a threaded Steinmann pin was inserted through a separate stab incision into the pelvis superior to the acetabulum and bent in the form of a stylus so as to assess limb length and hip offset throughout the procedure. An attempt was made to dislocate the femoral head posteriorly. There was gross evidence of movement at the previous fracture site with findings consistent with a fibrous nonunion. The femoral head was removed using a corkscrew device. There was bone loss noted along the calcar region. The femoral neck cut was performed using an oscillating saw. The anterior capsule was elevated off of the femoral neck using a periosteal elevator. Attention was then directed to the acetabulum. The remnant of the labrum was excised using electrocautery. Inspection of the acetabulum also demonstrated  significant degenerative changes. The acetabulum was reamed in sequential fashion up to a 53 mm diameter.  Good punctate bleeding bone was encountered. A 54 mm Pinnacle Gription Sector acetabular component was positioned and impacted into place. Good scratch fit was appreciated. It was elected to place one 6.5 mm cancellus bone through one of the dome holes. A neutral polyethylene trial was inserted.  Attention was then directed to the proximal femur. A hole for reaming of the proximal femoral canal was created using a high-speed burr. The femoral canal was reamed in sequential fashion up to a 12 mm diameter. This allowed for approximately 7 mm of scratch fit. Serial broaches were inserted up to a 12 mm small stature femoral broach. Calcar region was planed and a trial reduction was performed using a 36 mm hip ball with a -2 mm neck length. Good equalization of limb lengths and hip offset was appreciated and excellent stability was noted both anteriorly and posteriorly. Trial components were removed. The acetabular shell was irrigated with copious amounts of normal saline with antibiotic solution and suctioned dry. A neutral Pinnacle Marathon polyethylene insert was positioned and impacted into place. Next, a 12 mm small stature AML femoral stem was positioned and impacted into place. Excellent scratch fit was appreciated. A trial reduction was again performed with a 36 mm hip ball with a -2 mm neck length. Again, good equalization of limb lengths was appreciated and excellent stability appreciated both anteriorly and posteriorly. The hip was then dislocated and the trial hip ball was removed. The Morse taper was cleaned and dried. A 36 mm M-SPEC hip ball with a -2 mm neck length was placed on the trunnion and impacted into place. The hip was then reduced and placed through range of motion. Excellent stability was appreciated both anteriorly and posteriorly.  The wound was irrigated with copious amounts of normal saline with antibiotic solution and suctioned dry. Good hemostasis was appreciated. The posterior  capsulotomy was repaired using #5 Ethibond. Piriformis tendon was reapproximated to the undersurface of the gluteus medius tendon using #5 Ethibond. Two medium drains were placed in the wound bed and brought out through separate stab incisions to be attached to a Hemovac reservoir. The IT band was reapproximated using interrupted sutures of #1 Vicryl. Subcutaneous tissue was proximal phalanx using first #0 Vicryl followed by #2-0 Vicryl. The skin was closed with skin staples.  The patient tolerated the procedure well and was transported to the recovery room in stable condition.   Marciano Sequin., M.D.

## 2015-01-14 NOTE — Anesthesia Preprocedure Evaluation (Signed)
Anesthesia Evaluation  Patient identified by MRN, date of birth, ID band Patient awake    Reviewed: Allergy & Precautions, NPO status   Airway Mallampati: III       Dental  (+) Teeth Intact   Pulmonary neg pulmonary ROS,    Pulmonary exam normal       Cardiovascular negative cardio ROS Normal cardiovascular exam    Neuro/Psych Depression negative neurological ROS     GI/Hepatic Neg liver ROS, GERD-  Medicated,  Endo/Other  negative endocrine ROS  Renal/GU negative Renal ROS     Musculoskeletal   Abdominal Normal abdominal exam  (+)   Peds  Hematology  (+) anemia ,   Anesthesia Other Findings   Reproductive/Obstetrics                             Anesthesia Physical Anesthesia Plan  ASA: II  Anesthesia Plan: Spinal   Post-op Pain Management:    Induction: Intravenous  Airway Management Planned: Nasal Cannula and Simple Face Mask  Additional Equipment:   Intra-op Plan:   Post-operative Plan:   Informed Consent: I have reviewed the patients History and Physical, chart, labs and discussed the procedure including the risks, benefits and alternatives for the proposed anesthesia with the patient or authorized representative who has indicated his/her understanding and acceptance.     Plan Discussed with: CRNA  Anesthesia Plan Comments:         Anesthesia Quick Evaluation

## 2015-01-14 NOTE — Transfer of Care (Signed)
Immediate Anesthesia Transfer of Care Note  Patient: Valerie Duran  Procedure(s) Performed: Procedure(s): TOTAL HIP ARTHROPLASTY (Right) HARDWARE REMOVAL/ REMOVAL OF 3 CANNULATED SCREWS FROM RIGHT HIP. (Right)  Patient Location: PACU  Anesthesia Type:Spinal  Level of Consciousness: awake  Airway & Oxygen Therapy: Patient Spontanous Breathing and Patient connected to face mask oxygen  Post-op Assessment: Report given to RN and Post -op Vital signs reviewed and stable  Post vital signs: stable  Last Vitals:  Filed Vitals:   01/14/15 1712  BP: 94/59  Pulse: 84  Temp: 36.2 C  Resp: 24    Complications: No apparent anesthesia complications

## 2015-01-14 NOTE — OR Nursing (Signed)
3 screws explanted from right hip

## 2015-01-14 NOTE — Brief Op Note (Signed)
01/14/2015  5:11 PM  PATIENT:  Valerie Duran  69 y.o. 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 (Right)  SURGEON:  Surgeon(s) and Role:    * Dereck Leep, MD - Primary  ASSISTANTS: Vance Peper, PA   ANESTHESIA:   spinal  EBL:  Total I/O In: 2300 [I.V.:2300] Out: 950 [Urine:150; Blood:800]  BLOOD ADMINISTERED:none  DRAINS: 2 medium hemovac   LOCAL MEDICATIONS USED:  NONE  SPECIMEN:  Source of Specimen:  right femoral head  DISPOSITION OF SPECIMEN:  PATHOLOGY  COUNTS:  YES  TOURNIQUET:  * No tourniquets in log *  DICTATION: .Dragon Dictation  PLAN OF CARE: Admit to inpatient   PATIENT DISPOSITION:  PACU - hemodynamically stable.   Delay start of Pharmacological VTE agent (>24hrs) due to surgical blood loss or risk of bleeding: yes

## 2015-01-14 NOTE — Anesthesia Postprocedure Evaluation (Signed)
  Anesthesia Post-op Note  Patient: Valerie Duran  Procedure(s) Performed: Procedure(s): TOTAL HIP ARTHROPLASTY (Right) HARDWARE REMOVAL/ REMOVAL OF 3 CANNULATED SCREWS FROM RIGHT HIP. (Right)  Anesthesia type:Spinal  Patient location: PACU  Post pain: Pain level controlled  Post assessment: Post-op Vital signs reviewed, Patient's Cardiovascular Status Stable, Respiratory Function Stable, Patent Airway and No signs of Nausea or vomiting  Post vital signs: Reviewed and stable  Last Vitals:  Filed Vitals:   01/14/15 1828  BP: 135/57  Pulse: 70  Temp: 36.4 C  Resp: 20    Level of consciousness: awake, alert  and patient cooperative  Complications: No apparent anesthesia complications

## 2015-01-15 ENCOUNTER — Encounter: Payer: Self-pay | Admitting: Orthopedic Surgery

## 2015-01-15 LAB — BASIC METABOLIC PANEL
ANION GAP: 4 — AB (ref 5–15)
BUN: 10 mg/dL (ref 6–20)
CO2: 30 mmol/L (ref 22–32)
CREATININE: 0.74 mg/dL (ref 0.44–1.00)
Calcium: 8.5 mg/dL — ABNORMAL LOW (ref 8.9–10.3)
Chloride: 104 mmol/L (ref 101–111)
GFR calc Af Amer: >60 mL/min (ref >60)
Glucose, Bld: 122 mg/dL — ABNORMAL HIGH (ref 65–99)
POTASSIUM: 3.9 mmol/L (ref 3.5–5.1)
SODIUM: 138 mmol/L (ref 135–145)

## 2015-01-15 LAB — URINALYSIS COMPLETE WITH MICROSCOPIC (ARMC ONLY)
Bilirubin Urine: NEGATIVE
Glucose, UA: 50 mg/dL — AB
LEUKOCYTES UA: NEGATIVE
NITRITE: NEGATIVE
PROTEIN: NEGATIVE mg/dL
Specific Gravity, Urine: 1.005 (ref 1.005–1.030)
pH: 5 (ref 5.0–8.0)

## 2015-01-15 LAB — CBC
HEMATOCRIT: 26.3 % — AB (ref 35.0–47.0)
Hemoglobin: 8.6 g/dL — ABNORMAL LOW (ref 12.0–16.0)
MCH: 25.1 pg — ABNORMAL LOW (ref 26.0–34.0)
MCHC: 32.5 g/dL (ref 32.0–36.0)
MCV: 77 fL — ABNORMAL LOW (ref 80.0–100.0)
Platelets: 229 10*3/uL (ref 150–440)
RBC: 3.42 MIL/uL — AB (ref 3.80–5.20)
RDW: 26.5 % — ABNORMAL HIGH (ref 11.5–14.5)
WBC: 6.7 10*3/uL (ref 3.6–11.0)

## 2015-01-15 NOTE — Progress Notes (Signed)
Kimeka's pain not controlled with PRN medications, pain remains 10/10, Dr Marry Guan notified. Order placed for Demerol 2m IV Once and Phenergan 12.57mIV Once.

## 2015-01-15 NOTE — Evaluation (Signed)
Occupational Therapy Evaluation Patient Details Name: WHITLEY PATCHEN MRN: [id number] DOB: 10-14-1945 Today's Date: 01/15/2015    History of Present Illness This patient is a 69 year old female who came to [facility] for a right THR posterior approach.   Clinical Impression   This patient is a 69 year old female who came to [facility] for a R total hip replacement (posterior approach).  Patient lives with her husband. She had broken her hip in the past and had an open reduction with internal fixation repair which failed.  She had been independent with basic ADL and used a wheel chair recently. She now requires assistance for lower body dressing.      Follow Up Recommendations       Equipment Recommendations       Recommendations for Other Services       Precautions / Restrictions Precautions Precautions: Posterior Hip Restrictions RLE Weight Bearing: Weight bearing as tolerated      Mobility Bed Mobility                  Transfers                      Balance                                            ADL                                         General ADL Comments: Patient been independent with basic ADL. She practiced Donned/doffed socks and pants to knees (drain still in place) with minimal assist.      Vision     Perception     Praxis      Pertinent Vitals/Pain       Hand Dominance     Extremity/Trunk Assessment Upper Extremity Assessment Upper Extremity Assessment: Overall WFL for tasks assessed   Lower Extremity Assessment Lower Extremity Assessment: Defer to PT evaluation       Communication     Cognition Arousal/Alertness: Awake/alert   Overall Cognitive Status: Within Functional Limits for tasks assessed       Memory: Decreased recall of precautions (Named 2 of 3 precautions)             General Comments       Exercises       Shoulder Instructions       Home Living Family/patient expects to be discharged to:: Private residence Living Arrangements: Spouse/significant other Available Help at Discharge: Family Type of Home: House                                  Prior Functioning/Environment          Comments: Patient had been in a wheel chair for a few months, otherwise independent    OT Diagnosis: Acute pain   OT Problem List: Decreased activity tolerance   OT Treatment/Interventions: Self-care/ADL training    OT Goals(Current goals can be found in the care plan section) Acute Rehab OT Goals Patient Stated Goal: To go home OT Goal Formulation: With patient/family Time For Goal Achievement: 01/22/15 Potential to Achieve Goals: Good  OT Frequency:  Min 1X/week   Barriers to D/C:            Co-evaluation              End of Session Equipment Utilized During Treatment:  (hip kit)  Activity Tolerance: Patient tolerated treatment well Patient left: in chair;with call bell/phone within reach;with chair alarm set;with family/visitor present   Time: 8546-2703 OT Time Calculation (min): 22 min Charges:  OT General Charges $OT Visit: 1 Procedure OT Evaluation $Initial OT Evaluation Tier I: 1 Procedure OT Treatments $Self Care/Home Management : 8-22 mins G-Codes:    Myrene Galas, MS/OTR/L  01/15/2015, 11:23 AM

## 2015-01-15 NOTE — Evaluation (Signed)
Physical Therapy Evaluation Patient Details Name: Valerie Duran MRN: [id number] DOB: [birth year] Today's Date: 01/15/2015   History of Present Illness  This patient is a 69 year old female who came to [facility] for a right THR posterior approach.  Clinical Impression  Pt presents with hx of anemia, heart murmur, arthritis, celiac disease, depression, and GERD. Examination reveals that pt is able to ambulate with CGA, transfer with min assist, and perform bed mobility with mod assist. Her primary physical deficits include decreased strength, ROM, and activity tolerance, as well as acute pain. Pt stated being worried that she had lost too much strength since being in a wheelchair, but examination shows that pt has a good starting point for therapy. She will continue to benefit from skilled PT in order to address these deficits for a safe return home.     Follow Up Recommendations Home health PT;Supervision/Assistance - 24 hour    Equipment Recommendations  None recommended by PT    Recommendations for Other Services       Precautions / Restrictions Precautions Precautions: Posterior Hip Restrictions Weight Bearing Restrictions: Yes RLE Weight Bearing: Weight bearing as tolerated      Mobility  Bed Mobility Overal bed mobility: Needs Assistance Bed Mobility: Supine to Sit     Supine to sit: Mod assist     General bed mobility comments: Pt requires assist for bodyweight support as well as management of LEs in order to maintain posterior hip precautions   Transfers Overall transfer level: Needs assistance Equipment used: Rolling walker (2 wheeled) Transfers: Sit to/from Stand Sit to Stand: Min assist         General transfer comment: Pt demonstrates good use of RW and hand placement prior to transfer. Only requires minimal lifting support from therapist. Demonstrates good functional strength. Needs cues to maintain precautions.   Ambulation/Gait Ambulation/Gait assistance: Min  guard Ambulation Distance (Feet): 10 Feet Assistive device: Rolling walker (2 wheeled) Gait Pattern/deviations: Step-to pattern;Decreased step length - right;Decreased step length - left Gait velocity: decreased   General Gait Details: Pt ambulated 10 ft total with a trip to the bedside commode halfway through ambulation. She requires VC for sequencing of RW with gait, but no unsteadiness or LOB noted   Stairs            Wheelchair Mobility    Modified Rankin (Stroke Patients Only)       Balance Overall balance assessment: No apparent balance deficits (not formally assessed)                                           Pertinent Vitals/Pain Pain Assessment: 0-10 Pain Score: 7  Pain Location: R hip Pain Intervention(s): Limited activity within patient's tolerance;Monitored during session;Premedicated before session;Utilized relaxation techniques;Ice applied    Home Living Family/patient expects to be discharged to:: Private residence Living Arrangements: Spouse/significant other Available Help at Discharge: Family Type of Home: House Home Access: Level entry     Home Layout: One level        Prior Function Level of Independence: Independent with assistive device(s) (Before first surgery, totally indepndent )         Comments: Patient had been in a wheel chair for a few months following first hip surgery, otherwise independent     Hand Dominance        Extremity/Trunk Assessment   Upper Extremity Assessment:  Overall WFL for tasks assessed           Lower Extremity Assessment: RLE deficits/detail RLE Deficits / Details: Acute pain, decreased ROM, and decreased strength.        Communication   Communication: No difficulties  Cognition Arousal/Alertness: Awake/alert Behavior During Therapy: WFL for tasks assessed/performed Overall Cognitive Status: Within Functional Limits for tasks assessed       Memory: Decreased recall of  precautions              General Comments      Exercises Total Joint Exercises Ankle Circles/Pumps: AROM;10 reps;Both Quad Sets: AROM;10 reps;Both Gluteal Sets: AROM;10 reps;Both Short Arc Quad: AROM;10 reps;Both Hip ABduction/ADduction: AROM;10 reps;Both Straight Leg Raises: AAROM;10 reps;Both Other Exercises Other Exercises: Pt performed transfer and toileting practice on bedside commode. Therapist assisted with technique and safety education, as well as clean up x 10 min.       Assessment/Plan    PT Assessment Patient needs continued PT services  PT Diagnosis Difficulty walking;Abnormality of gait;Generalized weakness;Acute pain   PT Problem List Decreased range of motion;Decreased strength;Decreased activity tolerance;Decreased balance;Decreased mobility;Decreased knowledge of use of DME;Decreased safety awareness;Decreased knowledge of precautions;Pain  PT Treatment Interventions DME instruction;Gait training;Stair training;Functional mobility training;Therapeutic activities;Therapeutic exercise;Balance training;Neuromuscular re-education;Patient/family education   PT Goals (Current goals can be found in the Care Plan section) Acute Rehab PT Goals Patient Stated Goal: To go home PT Goal Formulation: With patient Time For Goal Achievement: 01/29/15 Potential to Achieve Goals: Good    Frequency BID   Barriers to discharge        Co-evaluation               End of Session Equipment Utilized During Treatment: Gait belt Activity Tolerance: Patient tolerated treatment well Patient left: in chair;with call bell/phone within reach;with chair alarm set;with SCD's reapplied;with family/visitor present Nurse Communication: Mobility status         Time: 2025-4270 PT Time Calculation (min) (ACUTE ONLY): 46 min   Charges:         PT G CodesJanyth Contes February 05, 2015, 12:55 PM Janyth Contes, SPT. [phone]

## 2015-01-15 NOTE — Care Management Note (Addendum)
Case Management Note  Patient Details  Name: Valerie Duran MRN: [id number] Date of Birth: 06/03/1946  Subjective/Objective:                   Met with patient to discuss discharge planning. She plans to return home follow by [facility]. She still has a rolling walker she can use at home. She uses [facility] [phone] for Rx. She lives with her husband she states will be able to assist her at home. Action/Plan:  Referral called to [facility]. Lovenox 40mg #14 called in to [facility]. RNCM will continue to follow.   Expected Discharge Date:                  Expected Discharge Plan:     In-House Referral:     Discharge planning Services  CM Consult  Post Acute Care Choice:  Home Health Choice offered to:  Patient  DME Arranged:  N/A DME Agency:     HH Arranged:  PT HH Agency:  [facility]  Status of Service:     Medicare Important Message Given:    Date Medicare IM Given:    Medicare IM give by:    Date Additional Medicare IM Given:    Additional Medicare Important Message give by:     If discussed at Long Length of Stay Meetings, dates discussed:    Additional Comments: Lovenox $59.50.  Angela Johnson, RN 01/15/2015, 9:58 AM  

## 2015-01-15 NOTE — Progress Notes (Signed)
Dr Marry Guan called to check on Valerie Duran's pain, pain remains 10/10. Order for Oxycontin 9m every 12 hours and Oxycodone order changed from every 4 hours PRN to every 3 hours PRN. Nursing continues to work on pain control as ordered and monitor.

## 2015-01-15 NOTE — Progress Notes (Signed)
Clinical Education officer, museum (CSW) received SNF consult. PT is recommending home health. RN Case Manager aware of above. Please reconsult if future social work needs arise. CSW signing off.   Blima Rich, [place] [phone]

## 2015-01-15 NOTE — Progress Notes (Signed)
Pt experiencing some urinary frequency and burning upon urination, spoke with Dr. Roland Rack may order urinalyisis.

## 2015-01-15 NOTE — Progress Notes (Signed)
Physical Therapy Treatment Patient Details Name: Valerie Duran MRN: [id number] DOB: 1945/11/26 Today's Date: 01/15/2015    History of Present Illness This patient is a 69 year old female who came to [facility] for a right THR posterior approach.    PT Comments    Pt progressing towards goals this PM. She ambulates further and is more knowledgeable of how to perform bed mobility and transfers. She still exhibits strength, ROM, and acute pain deficits, and will still continue to benefit from skilled PT in order for her to return home safely. Pt very pleasant and motivated to participate in therapy.  Follow Up Recommendations  Home health PT;Supervision/Assistance - 24 hour     Equipment Recommendations  None recommended by PT    Recommendations for Other Services       Precautions / Restrictions Precautions Precautions: Posterior Hip Restrictions Weight Bearing Restrictions: Yes RLE Weight Bearing: Weight bearing as tolerated    Mobility  Bed Mobility Overal bed mobility: Needs Assistance Bed Mobility: Supine to Sit     Supine to sit: Mod assist     General bed mobility comments: Pt requires assist for bodyweight support as well as management of LEs in order to maintain posterior hip precautions   Transfers Overall transfer level: Needs assistance Equipment used: Rolling walker (2 wheeled) Transfers: Sit to/from Stand Sit to Stand: Min assist         General transfer comment:  (Slightly improved hand preparation this PM)  Ambulation/Gait Ambulation/Gait assistance: Min guard Ambulation Distance (Feet): 40 Feet Assistive device: Rolling walker (2 wheeled) Gait Pattern/deviations: Step-to pattern;Step-through pattern;Decreased step length - right;Decreased step length - left Gait velocity: decreased   General Gait Details: Pt able to ambulate 40 ft this PM demonstrating better sequencing of RW with gait as well as a better ability execute reciprocal gait pattern. Pt  states that she does not feel as much pain when she's walking as when she's transfering    Stairs            Wheelchair Mobility    Modified Rankin (Stroke Patients Only)       Balance Overall balance assessment: History of Falls                                  Cognition Arousal/Alertness: Awake/alert Behavior During Therapy: WFL for tasks assessed/performed Overall Cognitive Status: Within Functional Limits for tasks assessed       Memory: Decreased recall of precautions              Exercises Total Joint Exercises Ankle Circles/Pumps: AROM;10 reps;Both Quad Sets: AROM;10 reps;Both Gluteal Sets: AROM;10 reps;Both Short Arc Quad: AROM;10 reps;Both Hip ABduction/ADduction: AROM;10 reps;Both Straight Leg Raises: AAROM;10 reps;Both Other Exercises Other Exercises: Pt performed transfer and toileting practice on bedside commode. Therapist assisted with technique and safety education, as well as clean up x 10 min.     General Comments        Pertinent Vitals/Pain Pain Assessment: 0-10 Pain Score: 5  Pain Location: R hip Pain Intervention(s): Limited activity within patient's tolerance;Monitored during session;Premedicated before session;Utilized relaxation techniques    Home Living Family/patient expects to be discharged to:: Private residence Living Arrangements: Spouse/significant other Available Help at Discharge: Family Type of Home: House Home Access: Level entry   Home Layout: One level        Prior Function Level of Independence: Independent with assistive device(s) (Before first surgery,  totally indepndent )      Comments: Patient had been in a wheel chair for a few months following first hip surgery ORIF on same side (R hip) that failed, otherwise independent   PT Goals (current goals can now be found in the care plan section) Acute Rehab PT Goals Patient Stated Goal: to participate in therapy PT Goal Formulation: With  patient Time For Goal Achievement: 01/29/15 Potential to Achieve Goals: Good    Frequency  BID    PT Plan Current plan remains appropriate    Co-evaluation             End of Session Equipment Utilized During Treatment: Gait belt Activity Tolerance: Patient tolerated treatment well Patient left: in bed;with bed alarm set;with SCD's reapplied     Time: 1338-1410 PT Time Calculation (min) (ACUTE ONLY): 32 min  Charges:  $Therapeutic Exercise: 8-22 mins $Therapeutic Activity: 8-22 mins                    G CodesJanyth Contes 01/16/15, 4:26 PM  Janyth Contes, SPT. [phone]

## 2015-01-15 NOTE — Evaluation (Signed)
Physical Therapy Evaluation Patient Details Name: Valerie Duran MRN: [id number] DOB: 15-Jan-1946 Today's Date: 01/15/2015   History of Present Illness  This patient is a 69 year old female who came to [facility] for a right THR posterior approach.  Clinical Impression   Pt presents with hx of anemia, heart murmur, arthritis, celiac disease, depression, and GERD. Examination reveals that pt is able to ambulate with CGA, transfer with min assist, and perform bed mobility with mod assist. Her primary physical deficits include decreased strength, ROM, and activity tolerance, as well as acute pain. Pt stated being worried that she had lost too much strength since being in a wheelchair, but examination shows that pt has a good starting point for therapy. She will continue to benefit from skilled PT in order to address these deficits for a safe return home.            Follow Up Recommendations Home health PT;Supervision/Assistance - 24 hour    Equipment Recommendations  None recommended by PT    Recommendations for Other Services       Precautions / Restrictions Precautions Precautions: Posterior Hip Restrictions Weight Bearing Restrictions: Yes RLE Weight Bearing: Weight bearing as tolerated      Mobility  Bed Mobility Overal bed mobility: Needs Assistance Bed Mobility: Supine to Sit     Supine to sit: Mod assist     General bed mobility comments: Pt requires assist for bodyweight support as well as management of LEs in order to maintain posterior hip precautions   Transfers Overall transfer level: Needs assistance Equipment used: Rolling walker (2 wheeled) Transfers: Sit to/from Stand Sit to Stand: Min assist         General transfer comment: Pt demonstrates good use of RW and hand placement prior to transfer. Only requires minimal lifting support from therapist. Demonstrates good functional strength. Needs cues to maintain precautions.   Ambulation/Gait Ambulation/Gait  assistance: Min guard Ambulation Distance (Feet): 10 Feet Assistive device: Rolling walker (2 wheeled) Gait Pattern/deviations: Step-to pattern;Decreased step length - right;Decreased step length - left Gait velocity: decreased   General Gait Details: Pt ambulated 10 ft total with a trip to the bedside commode halfway through ambulation. She requires VC for sequencing of RW with gait, but no unsteadiness or LOB noted   Stairs            Wheelchair Mobility    Modified Rankin (Stroke Patients Only)       Balance Overall balance assessment: No apparent balance deficits (not formally assessed)                                           Pertinent Vitals/Pain Pain Assessment: 0-10 Pain Score: 7  Pain Location: R hip Pain Intervention(s): Limited activity within patient's tolerance;Monitored during session;Premedicated before session;Utilized relaxation techniques;Ice applied    Home Living Family/patient expects to be discharged to:: Private residence Living Arrangements: Spouse/significant other Available Help at Discharge: Family Type of Home: House Home Access: Level entry     Home Layout: One level        Prior Function Level of Independence: Independent with assistive device(s) (Before first surgery, totally indepndent )         Comments: Patient had been in a wheel chair for a few months following first hip surgery ORIF on same side (R hip) that failed, otherwise independent  Hand Dominance        Extremity/Trunk Assessment   Upper Extremity Assessment: Overall WFL for tasks assessed           Lower Extremity Assessment: RLE deficits/detail RLE Deficits / Details: Acute pain, decreased ROM, and decreased strength.        Communication   Communication: No difficulties  Cognition Arousal/Alertness: Awake/alert Behavior During Therapy: WFL for tasks assessed/performed Overall Cognitive Status: Within Functional Limits for  tasks assessed       Memory: Decreased recall of precautions              General Comments      Exercises Total Joint Exercises Ankle Circles/Pumps: AROM;10 reps;Both Quad Sets: AROM;10 reps;Both Gluteal Sets: AROM;10 reps;Both Short Arc Quad: AROM;10 reps;Both Hip ABduction/ADduction: AROM;10 reps;Both Straight Leg Raises: AAROM;10 reps;Both Other Exercises Other Exercises: Pt performed transfer and toileting practice on bedside commode. Therapist assisted with technique and safety education, as well as clean up x 10 min.       Assessment/Plan    PT Assessment Patient needs continued PT services  PT Diagnosis Difficulty walking;Abnormality of gait;Generalized weakness;Acute pain   PT Problem List Decreased range of motion;Decreased strength;Decreased activity tolerance;Decreased balance;Decreased mobility;Decreased knowledge of use of DME;Decreased safety awareness;Decreased knowledge of precautions;Pain  PT Treatment Interventions DME instruction;Gait training;Stair training;Functional mobility training;Therapeutic activities;Therapeutic exercise;Balance training;Neuromuscular re-education;Patient/family education   PT Goals (Current goals can be found in the Care Plan section) Acute Rehab PT Goals Patient Stated Goal: To go home PT Goal Formulation: With patient Time For Goal Achievement: 01/29/15 Potential to Achieve Goals: Good    Frequency BID   Barriers to discharge        Co-evaluation               End of Session Equipment Utilized During Treatment: Gait belt Activity Tolerance: Patient tolerated treatment well Patient left: in chair;with call bell/phone within reach;with chair alarm set;with SCD's reapplied;with family/visitor present Nurse Communication: Mobility status         Time: 7353-2992 PT Time Calculation (min) (ACUTE ONLY): 46 min   Charges:   PT Evaluation $Initial PT Evaluation Tier I: 1 Procedure PT Treatments $Therapeutic  Exercise: 8-22 mins $Therapeutic Activity: 8-22 mins   PT G CodesJanyth Contes 01-29-15, 2:19 PM Janyth Contes, SPT. [phone]

## 2015-01-15 NOTE — Plan of Care (Signed)
Problem: Consults Goal: Diagnosis- Total Joint Replacement Outcome: Completed/Met Date Met:  01/15/15 Primary Total Hip

## 2015-01-15 NOTE — Progress Notes (Signed)
   Subjective: 1 Day Post-Op Procedure(s) (LRB): TOTAL HIP ARTHROPLASTY (Right) HARDWARE REMOVAL/ REMOVAL OF 3 CANNULATED SCREWS FROM RIGHT HIP. (Right) Patient reports pain as 6 on 0-10 scale.   Patient is well, and has had no acute complaints or problems; still c/o greatest pain to the distal thigh area and back issues that are chronic We will start therapy today.  Plan is to go Rehab after hospital stay. no nausea and no vomiting Patient denies any chest pains or shortness of breath. Objective: Vital signs in last 24 hours: Temp:  [96 F (35.6 C)-98.4 F (36.9 C)] 98.4 F (36.9 C) (07/26 0436) Pulse Rate:  [42-114] 114 (07/26 0436) Resp:  [15-28] 20 (07/26 0436) BP: (94-143)/(46-84) 104/48 mmHg (07/26 0436) SpO2:  [85 %-100 %] 95 % (07/26 0436) Weight:  [83.915 kg (185 lb)] 83.915 kg (185 lb) (07/25 1900) well approximated incision Heels are non tender and elevated off the bed using rolled towels Intake/Output from previous day: 07/25 0701 - 07/26 0700 In: 3601.7 [I.V.:3301.7; IV Piggyback:300] Out: 2360 [Urine:1290; Drains:270; Blood:800] Intake/Output this shift:     Recent Labs  01/15/15 0504  HGB 8.6*    Recent Labs  01/15/15 0504  WBC 6.7  RBC 3.42*  HCT 26.3*  PLT 229    Recent Labs  01/15/15 0504  NA 138  K 3.9  CL 104  CO2 30  BUN 10  CREATININE 0.74  GLUCOSE 122*  CALCIUM 8.5*   No results for input(s): LABPT, INR in the last 72 hours.  EXAM General - Patient is Alert, Appropriate, Oriented and Confused Extremity - Neurologically intact Neurovascular intact Sensation intact distally Intact pulses distally Dorsiflexion/Plantar flexion intact Dressing - dressing C/D/I Motor Function - intact, moving foot and toes well on exam.   Past Medical History  Diagnosis Date  . Anemia   . Heart murmur   . Arthritis   . Celiac disease   . Depression   . GERD (gastroesophageal reflux disease)     Assessment/Plan: 1 Day Post-Op  Procedure(s) (LRB): TOTAL HIP ARTHROPLASTY (Right) HARDWARE REMOVAL/ REMOVAL OF 3 CANNULATED SCREWS FROM RIGHT HIP. (Right) Active Problems:   S/P total hip arthroplasty  Estimated body mass index is 28.14 kg/(m^2) as calculated from the following:   Height as of this encounter: 5' 8"  (1.727 m).   Weight as of this encounter: 83.915 kg (185 lb). Advance diet Up with therapy D/C IV fluids Discharge to SNF thurs.   Labs: reviewed New labs tomorrow DVT Prophylaxis - Lovenox, Foot Pumps and TED hose Weight-Bearing as tolerated to left leg Needs to start working on having a bowel movement Discontinue morphine D/C O2 and Pulse OX and try on Room [employer] [facility]. [place] 01/15/2015, 7:29 AM

## 2015-01-16 LAB — CBC
HCT: 31 % — ABNORMAL LOW (ref 35.0–47.0)
Hemoglobin: 10.1 g/dL — ABNORMAL LOW (ref 12.0–16.0)
MCH: 25.4 pg — AB (ref 26.0–34.0)
MCHC: 32.6 g/dL (ref 32.0–36.0)
MCV: 77.8 fL — ABNORMAL LOW (ref 80.0–100.0)
Platelets: 275 10*3/uL (ref 150–440)
RBC: 3.99 MIL/uL (ref 3.80–5.20)
RDW: 27.3 % — ABNORMAL HIGH (ref 11.5–14.5)
WBC: 13.3 10*3/uL — ABNORMAL HIGH (ref 3.6–11.0)

## 2015-01-16 LAB — BASIC METABOLIC PANEL
ANION GAP: 10 (ref 5–15)
BUN: 8 mg/dL (ref 6–20)
CALCIUM: 8.8 mg/dL — AB (ref 8.9–10.3)
CO2: 27 mmol/L (ref 22–32)
Chloride: 99 mmol/L — ABNORMAL LOW (ref 101–111)
Creatinine, Ser: 0.66 mg/dL (ref 0.44–1.00)
GFR calc Af Amer: >60 mL/min (ref >60)
GFR calc non Af Amer: >60 mL/min (ref >60)
GLUCOSE: 237 mg/dL — AB (ref 65–99)
Potassium: 3.4 mmol/L — ABNORMAL LOW (ref 3.5–5.1)
SODIUM: 136 mmol/L (ref 135–145)

## 2015-01-16 MED ORDER — POTASSIUM CHLORIDE 20 MEQ PO PACK
20.0000 meq | PACK | Freq: Three times a day (TID) | ORAL | Status: AC
  Administered 2015-01-16 (×3): 20 meq via ORAL
  Filled 2015-01-16 (×3): qty 1

## 2015-01-16 MED ORDER — OXYCODONE HCL ER 10 MG PO T12A: 10.0000 mg | EXTENDED_RELEASE_TABLET | Freq: Two times a day (BID) | ORAL | Status: DC

## 2015-01-16 MED ORDER — SODIUM CHLORIDE 0.9 % IV BOLUS (SEPSIS)
1000.0000 mL | Freq: Once | INTRAVENOUS | Status: AC
  Administered 2015-01-16: 1000 mL via INTRAVENOUS

## 2015-01-16 MED ORDER — OXYCODONE HCL 5 MG PO TABS: 5.0000 mg | ORAL_TABLET | ORAL | Status: DC | PRN

## 2015-01-16 MED ORDER — LACTULOSE 10 GM/15ML PO SOLN: 10.0000 g | Freq: Two times a day (BID) | ORAL | Status: DC | PRN

## 2015-01-16 MED ORDER — ENOXAPARIN SODIUM 30 MG/0.3ML ~~LOC~~ SOLN: 30.0000 mg | Freq: Two times a day (BID) | SUBCUTANEOUS | Status: DC

## 2015-01-16 MED ORDER — TRAMADOL HCL 50 MG PO TABS: 50.0000 mg | ORAL_TABLET | ORAL | Status: DC | PRN

## 2015-01-16 NOTE — Discharge Summary (Addendum)
Physician Discharge Summary  Patient ID: Valerie Duran MRN: [id number] DOB/AGE: 1945/11/18 69 y.o.  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 S/P total hip arthroplasty 01/14/2015  . Iron deficiency anemia 12/20/2014  . CD (celiac disease) 12/13/2014  . Chronic LBP 12/13/2014  . Clinical depression 12/13/2014  . Fatigue 12/13/2014  . Acid reflux 12/13/2014  . Restless leg 12/13/2014  . Anemia 12/13/2014  . Closed basicervical fracture of neck of femur 08/24/2014  . Closed fracture of neck of radius 08/24/2014  . Fracture of neck of radius 08/07/2014    Past Medical History  Diagnosis Date  . Anemia   . Heart murmur   . Arthritis   . Celiac disease   . Depression   . GERD (gastroesophageal reflux disease)      Transfusion: No transfusions during this admission   Consultants (if any):   case management  Discharged Condition: Improved  Hospital Course: Valerie Duran is an 69 y.o. female who was admitted 01/14/2015 with a diagnosis of "status post percutaneous pinning of a right femoral neck fracture with possible nonunion" and went to the operating room on 01/14/2015 and underwent the above named procedures.    Surgeries:Procedure(s): TOTAL HIP ARTHROPLASTY HARDWARE REMOVAL/ REMOVAL OF 3 CANNULATED SCREWS FROM RIGHT HIP. on 01/14/2015  ANESTHESIA: spinal  ESTIMATED BLOOD LOSS: 800 mL  FLUIDS REPLACED: 2300 mL of crystalloid  DRAINS: 2 medium drains to a Hemovac reservoir  IMPLANTS UTILIZED: DePuy 12 mm small stature AML femoral stem, 54 mm OD Pinnacle G[employer] Sector acetabular component, neutral Pinnacle Altrx polyethylene insert, and a 36 mm M-SPEC -2 mm hip ball, 6.5 mm x 25 mm cancellous screw  INDICATIONS FOR SURGERY: Valerie Duran is a 69 y.o. year old female who  sustained a right femoral neck fracture several months ago. She was treated with percutaneous pinning of the femoral neck fracture. Radiographs demonstrated significant compression of the fracture site with prominence of the screw heads and shortening. She was having significant pain and there was a concern of avascular necrosis versus nonunion of the fracture site.    The risks, benefits, and alternatives were discussed at length including but not limited to the risks of infection, bleeding, nerve injury, stiffness, blood clots, the need for revision surgery, limb length inequality, dislocation, cardiopulmonary complications, among others, and they were willing to proceed.  Patient tolerated the surgery well. No complications .Patient was taken to PACU where she was stabilized and then transferred to the orthopedic floor.  Patient started on Lovenox 30 mg q 12 hrs. Foot pumps applied bilaterally at 80 mm hg . Heels elevated off bed with rolled towels. No evidence of DVT. Calves non tender. Negative Homan. Physical therapy started on day #1 for gait training and transfer with OT starting on  day #1 for ADL and assisted devices. Patient has done well with therapy. Ambulated 60 feet upon being discharged.  Patient's IV , foley DC'd on day #1 and hemovac was d/c on day #2.   She was given perioperative antibiotics:  Anti-infectives    Start     Dose/Rate Route Frequency Ordered Stop   01/14/15 1900  ceFAZolin (ANCEF) IVPB 2 g/50 mL premix     2 g 100 mL/hr over 30 Minutes Intravenous Every 6 hours 01/14/15 1849 01/15/15 1303   01/14/15 5400  ceFAZolin (ANCEF) 2-3 GM-% IVPB SOLR    Comments:  Duran, Valerie Ann: cabinet override      01/14/15 0923 01/14/15 2129   01/13/15 2330  ceFAZolin (ANCEF) IVPB 2 g/50 mL premix     2 g 100 mL/hr over 30 Minutes Intravenous  Once 01/13/15 2324 01/14/15 1652   01/13/15 2330  vancomycin (VANCOCIN) IVPB 1000 mg/200 mL premix     1,000 mg 200 mL/hr over 60  Minutes Intravenous  Once 01/13/15 2324 01/14/15 1341    .  She was given sequential compression devices, early ambulation, and TED stockings and Lovenox for DVT prophylaxis.  She benefited maximally from the hospital stay and there were no complications.    Recent vital signs:  Filed Vitals:   01/16/15 1030  BP: 131/64  Pulse:   Temp:   Resp:     Recent laboratory studies:  Lab Results  Component Value Date   HGB 10.1* 01/16/2015   HGB 8.6* 01/15/2015   HGB 12.0 01/10/2015   Lab Results  Component Value Date   WBC 13.3* 01/16/2015   PLT 275 01/16/2015   Lab Results  Component Value Date   INR 0.97 01/09/2015   Lab Results  Component Value Date   NA 136 01/16/2015   K 3.4* 01/16/2015   CL 99* 01/16/2015   CO2 27 01/16/2015   BUN 8 01/16/2015   CREATININE 0.66 01/16/2015   GLUCOSE 237* 01/16/2015    Discharge Medications:     Medication List    STOP taking these medications        GLUCOSAMINE HCL PO     mupirocin nasal ointment 2 %  Commonly known as:  BACTROBAN      TAKE these medications        CALCIUM 600 + D PO  Take 1 tablet by mouth 2 (two) times daily.     Calcium-Vitamin D 600-200 MG-UNIT per tablet  Take by mouth.     DULoxetine 60 MG capsule  Commonly known as:  CYMBALTA  Take 60 mg by mouth every morning.     enoxaparin 30 MG/0.3ML injection  Commonly known as:  LOVENOX  Inject 0.3 mLs (30 mg total) into the skin every 12 (twelve) hours.  Start taking on:  01/18/2015     HYDROcodone-acetaminophen 10-500 MG per tablet  Commonly known as:  LORTAB  Take 1 tablet by mouth every 4 (four) hours as needed (0.5-1 tab as needed for pain). For pain     Magnesium 500 MG Caps  Take 1 capsule by mouth every morning.     oxyCODONE 5 MG immediate release tablet  Commonly known as:  Oxy IR/ROXICODONE  Take 1-2 tablets (5-10 mg total) by mouth every 3 (three) hours as needed for severe pain.     OxyCODONE 10 mg T12a 12 hr tablet  Commonly  known as:  OXYCONTIN  Take 1 tablet (10 mg total) by mouth every 12 (twelve) hours.     pantoprazole 40 MG tablet  Commonly known as:  PROTONIX  qam     potassium chloride 10 MEQ tablet  Commonly known as:  K-DUR,KLOR-CON  Take 10 mEq by mouth 2 (two) times daily.     pramipexole 1 MG tablet  Commonly known as:  MIRAPEX  Take 1 tablet by mouth 2 (two) times daily.     torsemide 10 MG tablet  Commonly known as:  DEMADEX  Take 10 mg by mouth every morning.     traMADol 50  MG tablet  Commonly known as:  ULTRAM  Take 1-2 tablets (50-100 mg total) by mouth every 4 (four) hours as needed for moderate pain.     ZIMS MAX-FREEZE EX  Apply 1 spray topically 4 (four) times daily as needed.        Diagnostic Studies: Dg Hip Port Unilat With Pelvis 1v Right  01/14/2015   CLINICAL DATA:  S/P total hip arthroplasty  EXAM: DG HIP (WITH OR WITHOUT PELVIS) 1V PORT RIGHT  COMPARISON:  None.  FINDINGS: Status post right total hip arthroplasty. No evidence for dislocation. Status post removal of Knowles pins. No acute fracture.  IMPRESSION: Status post right hip arthroplasty.  No adverse features.   Electronically Signed   By: Nolon Nations M.D.   On: 01/14/2015 17:58    Disposition:       Discharge Instructions    Diet - low sodium heart healthy    Complete by:  As directed      Increase activity slowly    Complete by:  As directed            Follow-up Information    Follow up with Issabelle Mcraney P, MD. Call in 6 weeks.   Specialty:  Orthopedic Surgery   Why:  Patient should already have an appointment scheduled   Contact information:   [place] [id number] [phone]        Signed: Watt Climes 01/16/2015, 11:56 AM

## 2015-01-16 NOTE — Clinical Documentation Improvement (Signed)
Supporting Information: Patient with a history of anemia per 7/26 progress notes.    EBL: 800 ml per 7/25 Anesthesia record.  7/25:  Ferrous sulfate 325 mg po ordered.  H/H: 7/27:   10.1/31.0 7/26:     8.6/26.3 Pre-op: 12.0/37.9   . Documentation of Anemia should include the type of anemia: --Nutritional --Hemolytic --Aplastic --Due to blood loss --Other (please specify) . Include in documentation if Anemia is due to nutrition or mineral deficits, resulting in a nutritional anemia . Document if the Anemia is due to a neoplasm (primary and/or secondary) . Document whether the ANEMIA is "related to or due to" chemo or radiotherapy treatments . Document any "cause-and-effect" relationship between the intervention and the blood or immune disorder . Document the specific drug if anemia is drug-induced . Link any laboratory findings to a related diagnosis (if appropriate) . Document any associated diagnoses/conditions     Thank Sherian Maroon Documentation Specialist [phone] [email or web address]

## 2015-01-16 NOTE — Progress Notes (Signed)
Inpatient Diabetes Program Recommendations  AACE/ADA: New Consensus Statement on Inpatient Glycemic Control (2013)  Target Ranges:  Prepandial:   less than 140 mg/dL      Peak postprandial:   less than 180 mg/dL (1-2 hours)      Critically ill patients:  140 - 180 mg/dL   Results for Valerie Duran, Valerie Duran (MRN [id number]) as of 01/16/2015 10:51  Ref. Range 01/15/2015 05:04 01/16/2015 06:36  Glucose Latest Ref Range: 65-99 mg/dL 122 (H) 237 (H)    Diabetes history: No Outpatient Diabetes medications: NA Current orders for Inpatient glycemic control: None  Inpatient Diabetes Program Recommendations Correction (SSI): Noted fasting glucose of 237 mg/dl this morning. While inpatient may want to consider ordering CBGs with Novolog correction scale ACHS. HgbA1C: Please consider ordering an A1C to evaluate glycemic control over the past 2-3 months.  Thanks, Barnie Alderman, RN, MSN, CCRN, CDE Diabetes Coordinator Inpatient Diabetes Program [phone] (Team Pager from [place] to [place]) [phone] ([place] office) [phone] [facility] office) [phone] [facility] office)

## 2015-01-16 NOTE — Progress Notes (Signed)
Physical Therapy Treatment Patient Details Name: Valerie Duran MRN: [id number] DOB: 1945-09-12 Today's Date: 01/16/2015    History of Present Illness This patient is a 69 year old female who came to [facility] for a right THR posterior approach.    PT Comments    Pt progressing towards goals this AM, although slightly limited by fatigue and lightheadedness secondary to poor sleep and hypotensive state (reference vitals in Ambulation/Gait section. Pt was willing to try walking and continues to demonstrate good functional strength with transfers. Will attempt ambulation to nursing station this PM. Pt will still benefit from skilled PT in order to address her strength, ROM, and activity tolerance deficits in order to return home safely.   Follow Up Recommendations  Home health PT;Supervision/Assistance - 24 hour     Equipment Recommendations  None recommended by PT    Recommendations for Other Services       Precautions / Restrictions Precautions Precautions: Posterior Hip Restrictions Weight Bearing Restrictions: Yes RLE Weight Bearing: Weight bearing as tolerated    Mobility  Bed Mobility               General bed mobility comments: Pt up in chair upon arrival. Bed mobility not assessed   Transfers Overall transfer level: Needs assistance Equipment used: Rolling walker (2 wheeled) Transfers: Sit to/from Stand Sit to Stand: Min assist         General transfer comment: Pt needs mild body weight support assist with standing this AM. Still requires VC to prop leg out for standing and sitting to maintain hip precautions, especially from lower surfaces such as the chair or bedside commode.   Ambulation/Gait Ambulation/Gait assistance: Min guard Ambulation Distance (Feet): 40 Feet Assistive device: Rolling walker (2 wheeled) Gait Pattern/deviations: Step-through pattern Gait velocity: decreased   General Gait Details: Pt demonstrating step-through reciprocal gait pattern  better this AM. Pt limited in distance walked this morning secondary to fatigue and hypotensive status (feelin light headed). BP in sitting was 89/61 and BP in standing was 104/79. Once in standing, pt stated that she felt good enough to ambulate some.     Stairs            Wheelchair Mobility    Modified Rankin (Stroke Patients Only)       Balance Overall balance assessment: History of Falls                                  Cognition Arousal/Alertness: Awake/alert Behavior During Therapy: WFL for tasks assessed/performed Overall Cognitive Status: Within Functional Limits for tasks assessed                      Exercises Total Joint Exercises Ankle Circles/Pumps: 15 reps Other Exercises Other Exercises: Pt performed bilateral LE therapeutic exercise in chair with supervision for proper technique. 12 repetitions of the following AROM exercises were performed: ankle pumps, LAQ, quad sets, heel slides, knee marching (semi-reclined for mainteance of hip precautions), and closed chain plantarflexion.       General Comments        Pertinent Vitals/Pain Pain Assessment: 0-10 Pain Score: 4  Pain Location: R hip Pain Intervention(s): Limited activity within patient's tolerance;Monitored during session;Utilized relaxation techniques;Premedicated before session    Home Living                      Prior Function  PT Goals (current goals can now be found in the care plan section) Acute Rehab PT Goals Patient Stated Goal: to attempt to walk PT Goal Formulation: With patient Time For Goal Achievement: 01/29/15 Potential to Achieve Goals: Good Progress towards PT goals: Progressing toward goals    Frequency  BID    PT Plan Current plan remains appropriate    Co-evaluation             End of Session Equipment Utilized During Treatment: Gait belt Activity Tolerance: Patient limited by fatigue Patient left: in chair;with  call bell/phone within reach;with chair alarm set;with SCD's reapplied     Time: 0822-0854 PT Time Calculation (min) (ACUTE ONLY): 32 min  Charges:                       G CodesJanyth Contes 15-Feb-2015, 11:03 AM Janyth Contes, SPT. [phone]

## 2015-01-16 NOTE — Discharge Instructions (Signed)
POSTERIOR TOTAL HIP REPLACEMENT POSTOPERATIVE DIRECTIONS  Hip Rehabilitation, Guidelines Following Surgery  The results of a hip operation are greatly improved after range of motion and muscle strengthening exercises. Follow all safety measures which are given to protect your hip. If any of these exercises cause increased pain or swelling in your joint, decrease the amount until you are comfortable again. Then slowly increase the exercises. Call your caregiver if you have problems or questions.   HOME CARE INSTRUCTIONS  Remove items at home which could result in a fall. This includes throw rugs or furniture in walking pathways.   ICE to the affected hip every three hours for 30 minutes at a time and then as needed for pain and swelling.  Continue to use ice on the hip for pain and swelling from surgery. You may notice swelling that will progress down to the foot and ankle.  This is normal after surgery.  Elevate the leg when you are not up walking on it.    Continue to use the breathing machine which will help keep your temperature down.  It is common for your temperature to cycle up and down following surgery, especially at night when you are not up moving around and exerting yourself.  The breathing machine keeps your lungs expanded and your temperature down.  DIET You may resume your previous home diet once your are discharged from the hospital.  DRESSING / WOUND  CARE / SHOWERING  DO NOT GET THE INCISION WET You may start showering once staples have been removed at 2 weeks. Change dressing as needed. Do not submerge the incision in water such as a bath tub, swimming pool or hot tubs until the incision is completely healed which is approximately 4 weeks.  STAPLE REMOVAL: Please remove staple at 2 weeks post op and apply benzoin and 1/2 inch steri strips  ACTIVITY Walk with your walker as instructed. Use walker as long as suggested by your caregivers.May go to using a cane once your  therapist feels that it is safe to do so. Avoid periods of inactivity such as sitting longer than an hour when not asleep. This helps prevent blood clots.  You may resume a sexual relationship in one month or when given the OK by your doctor.  You may return to work once you are cleared by your doctor.  Do not drive a car for 6 weeks or until released by you surgeon.  Do not drive while taking narcotics.   WEIGHT BEARING You may wait bear as tolerated on the surgical leg.  POSTOPERATIVE CONSTIPATION PROTOCOL Constipation - defined medically as fewer than three stools per week and severe constipation as less than one stool per week.  One of the most common issues patients have following surgery is constipation.  Even if you have a regular bowel pattern at home, your normal regimen is likely to be disrupted due to multiple reasons following surgery.  Combination of anesthesia, postoperative narcotics, change in appetite and fluid intake all can affect your bowels.  In order to avoid complications following surgery, here are some recommendations in order to help you during your recovery period.  Colace (docusate) - Pick up an over-the-counter form of Colace or another stool softener and take twice a day as long as you are requiring postoperative pain medications.  Take with a full glass of water daily.  If you experience loose stools or diarrhea, hold the colace until you stool forms back up.  If your symptoms  do not get better within 1 week or if they get worse, check with your doctor.  Dulcolax (bisacodyl) - Pick up over-the-counter and take as directed by the product packaging as needed to assist with the movement of your bowels.  Take with a full glass of water.  Use this product as needed if not relieved by Colace only.   MiraLax (polyethylene glycol) - Pick up over-the-counter to have on hand.  MiraLax is a solution that will increase the amount of water in your bowels to assist with bowel  movements.  Take as directed and can mix with a glass of water, juice, soda, coffee, or tea.  Take if you go more than two days without a movement. Do not use MiraLax more than once per day. Call your doctor if you are still constipated or irregular after using this medication for 7 days in a row.  If you continue to have problems with postoperative constipation, please contact the office for further assistance and recommendations.  If you experience "the worst abdominal pain ever" or develop nausea or vomiting, please contact the office immediatly for further recommendations for treatment.  ITCHING  If you experience itching with your medications, try taking only a single pain pill, or even half a pain pill at a time.  You can also use Benadryl over the counter for itching or also to help with sleep.   TED HOSE STOCKINGS Wear the elastic stockings on both legs. If you go home, you may remove these at night but will need to put them on the first thing in the morning. If you go to rehab, then you may remove them one hour per 8 hour shift. This is because in rehab you are not as active as you are at home.  MEDICATIONS See your medication summary on the After Visit Summary that the nursing staff will review with you prior to discharge.  You may have some home medications which will be placed on hold until you complete the course of blood thinner medication.  It is important for you to complete the blood thinner medication as prescribed by your surgeon.  Continue your approved medications as instructed at time of discharge.  PRECAUTIONS If you experience chest pain or shortness of breath - call 911 immediately for transfer to the hospital emergency department.  If you develop a fever greater that 101 F, purulent drainage from wound, increased redness or drainage from wound, foul odor from the wound/dressing, or calf pain - CONTACT YOUR SURGEON.                                                    FOLLOW-UP APPOINTMENTS Make sure you keep all of your appointments after your operation with your surgeon and caregivers. You should call the office at the above phone number and make an appointment for approximately 6 weeks after the date of your surgery or on the date instructed by your surgeon outlined in the "After Visit Summary". This appointment should have already been made prior to the surgery. If you don't remember the date and time, call the office at [phone].  RANGE OF MOTION AND STRENGTHENING EXERCISES  These exercises are designed to help you keep full movement of your hip joint. Follow your caregiver's or physical therapist's instructions. Perform all exercises about fifteen times, three times per  day or as directed. Exercise both hips, even if you have had only one joint replacement. These exercises can be done on a training (exercise) mat, on the floor, on a table or on a bed. Use whatever works the best and is most comfortable for you. Use music or television while you are exercising so that the exercises are a pleasant break in your day. This will make your life better with the exercises acting as a break in routine you can look forward to.  Lying on your back, slowly slide your foot toward your buttocks, raising your knee up off the floor. Then slowly slide your foot back down until your leg is straight again.  Lying on your back spread your legs as far apart as you can without causing discomfort.  Lying on your side, raise your upper leg and foot straight up from the floor as far as is comfortable. Slowly lower the leg and repeat.  Lying on your back, tighten up the muscle in the front of your thigh (quadriceps muscles). You can do this by keeping your leg straight and trying to raise your heel off the floor. This helps strengthen the largest muscle supporting your knee.  Lying on your back, tighten up the muscles of your buttocks both with the legs straight and with the knee bent  at a comfortable angle while keeping your heel on the floor.   DON'T FORGET THE 4 POSTERIOR HIP PRECAUTIONS   IF YOU ARE TRANSFERRED TO A SKILLED REHAB FACILITY If the patient is transferred to a skilled rehab facility following release from the hospital, a list of the current medications will be sent to the facility for the patient to continue.  When discharged from the skilled rehab facility, please have the facility set up the patient's [place] prior to being released. Also, the skilled facility will be responsible for providing the patient with their medications at time of release from the facility to include their pain medication, the muscle relaxants, and their blood thinner medication. If the patient is still at the rehab facility at time of the two week follow up appointment, the skilled rehab facility will also need to assist the patient in arranging follow up appointment in our office and any transportation needs.  MAKE SURE YOU:  Understand these instructions.  Get help right away if you are not doing well or get worse.    Pick up stool softner and laxative for home use following surgery while on pain medications. Continue to use ice for pain and swelling after surgery. Do not use any lotions or creams on the incision until instructed by your surgeon.

## 2015-01-16 NOTE — Plan of Care (Signed)
Problem: Phase I Progression Outcomes Goal: Pain controlled with appropriate interventions Outcome: Progressing Tolerating oral pain medication  Problem: Phase II Progression Outcomes Goal: Ambulates Outcome: Completed/Met Date Met:  01/16/15 Ambulating around room. Sitting up in chair this morning Goal: Tolerating diet Outcome: Completed/Met Date Met:  01/16/15 Tolerating diet

## 2015-01-16 NOTE — Progress Notes (Addendum)
Patient cooperative with care.  Only complaints this shift are of pain and nausea during first session of physical therapy.  Zofran was given and nausea resolved.  Husband present at bedside.  Patient ambulated around entire nurses station with Physical Therapy, has no stairs at home. Patient was hypotensive this morning and bolus administered and blood pressure resumed normal level.  Potassium supplements added for slightly low potassium level.   Plan to discharge home when ortho clears, possibly tomorrow or Friday.

## 2015-01-16 NOTE — Progress Notes (Signed)
Occupational Therapy Treatment Patient Details Name: LATOI GIRALDO MRN: [id number] DOB: Oct 22, 1945 Today's Date: 01/16/2015    History of present illness This patient is a 69 year old female who came to [facility] for a right THR posterior approach.   OT comments  Patient progressing well, cues for technique and to stay within hip precautions (posterior approach)   Follow Up Recommendations       Equipment Recommendations       Recommendations for Other Services      Precautions / Restrictions Precautions Precautions: Posterior Hip Restrictions Weight Bearing Restrictions: Yes RLE Weight Bearing: Weight bearing as tolerated       Mobility Bed Mobility              Transfers          Balance                                  ADL  Practiced techniques for lower body dressing (unable to actually get dressed as drain still in place) Donned/doffed socks and pants to knees (drain still in place) needed cues for technique but only one cue to stay within hip precautions (posterior approach)                                               Vision                     Perception     Praxis      Cognition   Behavior During Therapy: [facility] for tasks assessed/performed Overall Cognitive Status: Within Functional Limits for tasks assessed       Memory: Decreased recall of precautions (Named 2 of 3 hip precautions)               Extremity/Trunk Assessment               Exercises   Shoulder Instructions       General Comments      Pertinent Vitals/ Pain       Pain Assessment: 0-10 Pain Score: 3  Pain Location: right hip Pain Intervention(s):   Home Living                                          Prior Functioning/Environment              Frequency       Progress Toward Goals  OT Goals(current goals can now be found in the care plan section)  Progress towards OT goals: Progressing  toward goals    Plan Discharge plan remains appropriate    Co-evaluation                 End of Session Equipment Utilized During Treatment:  (hip kit)   Activity Tolerance Patient tolerated treatment well   Patient Left in chair;with call bell/phone within reach;with family/visitor present (With PT)   Nurse Communication          Time: 1696-7893 OT Time Calculation (min): 25 min  Charges: OT General Charges $OT Visit: 1 Procedure OT Treatments $Self Care/Home Management : 23-37 mins  Myrene Galas, MS/OTR/L  01/16/2015,  2:10 PM

## 2015-01-16 NOTE — Progress Notes (Signed)
RESPONSE TO CODING QUERY:  Patient has documented SEVERE iron deficiency anemia and is being followed by hematology. She received multiple transfusions and iron infusions prior to surgery.  She subsequently had acute blood loss anemia (related to surgery) that is superimposed on her preexisting iron deficiency anemia.  James P. Holley Bouche M.D.

## 2015-01-16 NOTE — Progress Notes (Signed)
Physical Therapy Treatment Patient Details Name: Valerie Duran MRN: [id number] DOB: October 13, 1945 Today's Date: 01/16/2015    History of Present Illness This patient is a 69 year old female who came to [facility] for a right THR posterior approach.    PT Comments    Pt performed much better this PM and not limited by hypotension or lightheadedness. Able to ambulate much greater distance and improving transfers and bed mobility. She remains very motivated to participate in therapy and will continue to benefit from skilled PT to address ROM, strength, and activity tolerance deficits in order for safe return home.   Follow Up Recommendations  Home health PT;Supervision/Assistance - 24 hour     Equipment Recommendations  None recommended by PT    Recommendations for Other Services       Precautions / Restrictions Precautions Precautions: Posterior Hip Restrictions Weight Bearing Restrictions: Yes RLE Weight Bearing: Weight bearing as tolerated    Mobility  Bed Mobility Overal bed mobility: Needs Assistance Bed Mobility: Sit to Supine       Sit to supine: Min assist   General bed mobility comments: Pt improving independence with bed mobility by better managing her LLE for maintenance of hip precautions  Transfers Overall transfer level: Needs assistance Equipment used: Rolling walker (2 wheeled) Transfers: Sit to/from Stand Sit to Stand: Min assist         General transfer comment:  (No lightheadednes this PM with standing )  Ambulation/Gait Ambulation/Gait assistance: Min guard Ambulation Distance (Feet): 220 Feet Assistive device: Rolling walker (2 wheeled) Gait Pattern/deviations: Step-through pattern;Decreased step length - right;Decreased step length - left Gait velocity: decreased   General Gait Details: Pt able to ambulate much greater distance this PM with smoother reciprocal gait pattern. Pt not limited by hypotension or lightheadedness    Stairs            Wheelchair Mobility    Modified Rankin (Stroke Patients Only)       Balance Overall balance assessment: History of Falls                                  Cognition Arousal/Alertness: Awake/alert Behavior During Therapy: WFL for tasks assessed/performed Overall Cognitive Status: Within Functional Limits for tasks assessed       Memory: Decreased recall of precautions (Named 2 of 3 hip precautions)              Exercises Total Joint Exercises Ankle Circles/Pumps: 15 reps Other Exercises Other Exercises: Pt performed bilateral LE therapeutic exercise in chair with supervision for proper technique. 12 repetitions of the following AROM exercises were performed: ankle pumps, LAQ, quad sets, heel slides, knee marching (semi-reclined for mainteance of hip precautions), and closed chain plantarflexion.       General Comments        Pertinent Vitals/Pain Pain Assessment: 0-10 Pain Score: 2  Pain Location: R hip Pain Intervention(s): Limited activity within patient's tolerance;Monitored during session;Premedicated before session;Relaxation    Home Living                      Prior Function            PT Goals (current goals can now be found in the care plan section) Acute Rehab PT Goals Patient Stated Goal: to walk further PT Goal Formulation: With patient Time For Goal Achievement: 01/29/15 Potential to Achieve Goals: Good Progress towards PT  goals: Progressing toward goals    Frequency  BID    PT Plan Current plan remains appropriate    Co-evaluation             End of Session Equipment Utilized During Treatment: Gait belt Activity Tolerance: Patient tolerated treatment well Patient left: in bed;with call bell/phone within reach;with bed alarm set;with family/visitor present;with SCD's reapplied     Time: 1354-1425 PT Time Calculation (min) (ACUTE ONLY): 31 min  Charges:  $Gait Training: 8-22 mins $Therapeutic Exercise:  8-22 mins                    G CodesJanyth Contes 2015/01/26, 2:39 PM  Janyth Contes, SPT. [phone]

## 2015-01-16 NOTE — Progress Notes (Signed)
   Subjective: 2 Days Post-Op Procedure(s) (LRB): TOTAL HIP ARTHROPLASTY (Right) HARDWARE REMOVAL/ REMOVAL OF 3 CANNULATED SCREWS FROM RIGHT HIP. (Right) Patient reports pain as moderate.   Patient is well, and has had no acute complaints or problems Continue with physical therapy today.  Plan is to go Rehab after hospital stay. no nausea and no vomiting Patient denies any chest pains or shortness of breath. Objective: Vital signs in last 24 hours: Temp:  [98.1 F (36.7 C)-99.3 F (37.4 C)] 99.3 F (37.4 C) (07/27 0739) Pulse Rate:  [80-153] 90 (07/27 0739) Resp:  [18-20] 18 (07/27 0739) BP: (89-153)/(51-69) 131/64 mmHg (07/27 1030) SpO2:  [94 %-100 %] 98 % (07/27 0739) well approximated incision Heels are non tender and elevated off the bed using rolled towels Intake/Output from previous day: 07/26 0701 - 07/27 0700 In: 600 [I.V.:600] Out: 1481 [Urine:1300; Drains:180; Stool:1] Intake/Output this shift: Total I/O In: 120 [P.O.:120] Out: 550 [Urine:550]   Recent Labs  01/15/15 0504 01/16/15 0636  HGB 8.6* 10.1*    Recent Labs  01/15/15 0504 01/16/15 0636  WBC 6.7 13.3*  RBC 3.42* 3.99  HCT 26.3* 31.0*  PLT 229 275    Recent Labs  01/15/15 0504 01/16/15 0636  NA 138 136  K 3.9 3.4*  CL 104 99*  CO2 30 27  BUN 10 8  CREATININE 0.74 0.66  GLUCOSE 122* 237*  CALCIUM 8.5* 8.8*   No results for input(s): LABPT, INR in the last 72 hours.  EXAM General - Patient is Alert, Appropriate and Oriented Extremity - Neurologically intact Neurovascular intact Sensation intact distally Intact pulses distally Dorsiflexion/Plantar flexion intact Dressing - dressing C/D/I Motor Function - intact, moving foot and toes well on exam. Patient was a little dizzy this morning but after a bolus was given vital signs are normal and she is asymptomatic.  Past Medical History  Diagnosis Date  . Anemia   . Heart murmur   . Arthritis   . Celiac disease   . Depression    . GERD (gastroesophageal reflux disease)     Assessment/Plan: 2 Days Post-Op Procedure(s) (LRB): TOTAL HIP ARTHROPLASTY (Right) HARDWARE REMOVAL/ REMOVAL OF 3 CANNULATED SCREWS FROM RIGHT HIP. (Right) Active Problems:   S/P total hip arthroplasty  Estimated body mass index is 28.14 kg/(m^2) as calculated from the following:   Height as of this encounter: 5' 8"  (1.727 m).   Weight as of this encounter: 83.915 kg (185 lb). Up with therapy Plan for discharge tomorrow Discharge to SNF  Labs: Were reviewed DVT Prophylaxis - Lovenox, Foot Pumps and TED hose Weight-Bearing as tolerated to right leg Patient needs to have a bowel movement today Potassium ordered for 3 times a day Check met B in the morning    Octavion Mollenkopf R. [place] 01/16/2015, 11:47 AM

## 2015-01-16 NOTE — Progress Notes (Signed)
Orthopaedics -  The patient is seen lying in bed. Pain has been well controlled. She tolerated ambulation this afternoon without any episodes of dizziness.  Right hip dressing with only scant spotting of blood. No significant ecchymosis. Hemovac drains pulled and dressing applied. Homan test is negative. NVI.  Notes from physical therapy reviewed. Anticipate discharge to home tomorrow with home PT once she completes stair training.  James P. Holley Bouche M.D.

## 2015-01-16 NOTE — Care Management Important Message (Signed)
Important Message  Patient Details  Name: NAJIYAH PARIS MRN: [id number] Date of Birth: January 29, 1946   Medicare Important Message Given:  Yes-second notification given    Marshell Garfinkel, RN 01/16/2015, 1:12 PM

## 2015-01-17 LAB — SURGICAL PATHOLOGY

## 2015-01-17 NOTE — Progress Notes (Signed)
Physical Therapy Treatment Patient Details Name: Valerie Duran MRN: [id number] DOB: [birth year] Today's Date: 01/17/2015    History of Present Illness This patient is a 69 year old female who came to [facility] for a right THR posterior approach.    PT Comments    Pt has made good progress towards goals and remains pleasant to work with. She ambulates with supervision, transfers with CGA,, and demonstrates good knowledge of hip precautions during bed mobility and other functional activities. She will still benefit from skilled PT in order to address the strength, ROM, and gait impairments that she still has.   Follow Up Recommendations  Home health PT;Supervision/Assistance - 24 hour     Equipment Recommendations  None recommended by PT    Recommendations for Other Services       Precautions / Restrictions Precautions Precautions: Posterior Hip Restrictions Weight Bearing Restrictions: Yes RLE Weight Bearing: Weight bearing as tolerated    Mobility  Bed Mobility               General bed mobility comments: Pt already in chair this AM so bed mobility was not assessed. Pt aware of proper sequencing of LE movment in order to maintain hip precautions during bed mobility   Transfers Overall transfer level: Needs assistance Equipment used: Rolling walker (2 wheeled) Transfers: Sit to/from Stand Sit to Stand: Min guard         General transfer comment: Pt able to demonstrate the functional strength necessary to transfer without assist, just CGA for safety within the hospital. Pt demonstrates safety precautions to maintain hip precautions such as keeping the leg propped out with sitting/standing.  Ambulation/Gait Ambulation/Gait assistance: Supervision Ambulation Distance (Feet): 220 Feet Assistive device: Rolling walker (2 wheeled) Gait Pattern/deviations: Step-through pattern;Decreased step length - right;Decreased step length - left Gait velocity:  (decreased)   General  Gait Details: Pt ambulated 220 ft with break halfway through in order to practice stair training. Pt still requires cueing to for step length, but does demonstrate reciprocal pattern regularly    Stairs Stairs: Yes Stairs assistance: Min guard Stair Management: Two rails Number of Stairs: 4 General stair comments: Pt able to perform stair training with good stability and technique. She requires VC on which leg to lead with going up and down.    Wheelchair Mobility    Modified Rankin (Stroke Patients Only)       Balance Overall balance assessment: History of Falls                                  Cognition Arousal/Alertness: Awake/alert Behavior During Therapy: WFL for tasks assessed/performed Overall Cognitive Status: Within Functional Limits for tasks assessed       Memory:  (good recall of precautions )              Exercises Other Exercises Other Exercises: Pt performed bilateral LE therapeutic exercise in chair with supervision for proper technique. 15 repetitions of the following AROM exercises were performed: ankle pumps, LAQ, quad sets, heel slides, knee marching (semi-reclined for mainteance of hip precautions), and closed chain plantarflexion.    (Pt knowledgeable of exercises. Packet issued. )    General Comments General comments (skin integrity, edema, etc.): No noted lightheadeness this AM with mobility      Pertinent Vitals/Pain Pain Assessment: 0-10 Pain Score: 2  Pain Location: R hip Pain Intervention(s): Limited activity within patient's tolerance;Monitored during session;Premedicated before  session;Utilized relaxation techniques    Home Living                      Prior Function            PT Goals (current goals can now be found in the care plan section) Acute Rehab PT Goals Patient Stated Goal: to practice steps PT Goal Formulation: With patient Time For Goal Achievement: 01/29/15 Potential to Achieve Goals:  Good Progress towards PT goals: Progressing toward goals    Frequency  BID    PT Plan Current plan remains appropriate    Co-evaluation             End of Session Equipment Utilized During Treatment: Gait belt Activity Tolerance: Patient tolerated treatment well Patient left: in chair;with call bell/phone within reach;with chair alarm set     Time: 5374-8270 PT Time Calculation (min) (ACUTE ONLY): 41 min  Charges:                       G CodesJanyth Contes Jan 19, 2015, 11:09 AM  Janyth Contes, SPT. [phone]

## 2015-01-17 NOTE — Progress Notes (Signed)
Completed discharge instructions with patient and spouse.  No complaints at present time.  Discharged to home with [facility].

## 2015-01-17 NOTE — Care Management (Signed)
Notified by MD that patient received call from [facility] [phone] stating that they received E-scribe for Lovenox from Vance Peper PA. Rx canceled with [facility]. Patient to pick up order from [facility] as originally planned. Patient updated. Arville Go notified of patient discharge today. No further RNCM needs. Case closed.

## 2015-01-17 NOTE — Progress Notes (Signed)
Correct time taken 1022

## 2015-01-17 NOTE — Progress Notes (Signed)
  Subjective: 3 Days Post-Op Procedure(s) (LRB): TOTAL HIP ARTHROPLASTY (Right) HARDWARE REMOVAL/ REMOVAL OF 3 CANNULATED SCREWS FROM RIGHT HIP. (Right) Patient reports pain as mild.   Patient seen in rounds with Dr. Marry Guan. Patient is well, and has had no acute complaints or problems Plan is to go Home after hospital stay. The patient will do stairs today with physical therapy and then be discharged. Negative for chest pain and shortness of breath Fever: no Gastrointestinal: Negative for nausea and vomiting  Objective: Vital signs in last 24 hours: Temp:  [97.1 F (36.2 C)-99.3 F (37.4 C)] 98.2 F (36.8 C) (07/28 0418) Pulse Rate:  [80-115] 115 (07/28 0418) Resp:  [18] 18 (07/28 0418) BP: (89-152)/(49-78) 150/78 mmHg (07/28 0418) SpO2:  [98 %-100 %] 100 % (07/28 0418)  Intake/Output from previous day:  Intake/Output Summary (Last 24 hours) at 01/17/15 0559 Last data filed at 01/17/15 0430  Gross per 24 hour  Intake   2300 ml  Output   1400 ml  Net    900 ml    Intake/Output this shift: Total I/O In: -  Out: 450 [Urine:450]  Labs:  Recent Labs  01/15/15 0504 01/16/15 0636  HGB 8.6* 10.1*    Recent Labs  01/15/15 0504 01/16/15 0636  WBC 6.7 13.3*  RBC 3.42* 3.99  HCT 26.3* 31.0*  PLT 229 275    Recent Labs  01/15/15 0504 01/16/15 0636  NA 138 136  K 3.9 3.4*  CL 104 99*  CO2 30 27  BUN 10 8  CREATININE 0.74 0.66  GLUCOSE 122* 237*  CALCIUM 8.5* 8.8*   No results for input(s): LABPT, INR in the last 72 hours.   EXAM General - Patient is Alert and Oriented Extremity - Neurovascular intact Dorsiflexion/Plantar flexion intact Incision: scant drainage Dressing/Incision - clean, dry Motor Function - intact, moving foot and toes well on exam.   Past Medical History  Diagnosis Date  . Anemia   . Heart murmur   . Arthritis   . Celiac disease   . Depression   . GERD (gastroesophageal reflux disease)     Assessment/Plan: 3 Days Post-Op  Procedure(s) (LRB): TOTAL HIP ARTHROPLASTY (Right) HARDWARE REMOVAL/ REMOVAL OF 3 CANNULATED SCREWS FROM RIGHT HIP. (Right) Active Problems:   S/P total hip arthroplasty  Estimated body mass index is 28.14 kg/(m^2) as calculated from the following:   Height as of this encounter: 5' 8"  (1.727 m).   Weight as of this encounter: 83.915 kg (185 lb). Discharge home with home health after doing physical therapy this morning  DVT Prophylaxis - Lovenox, Foot Pumps and TED hose Weight-Bearing as tolerated to right leg  Reche Dixon, PA-C Orthopaedic Surgery 01/17/2015, 5:59 AM

## 2015-01-20 ENCOUNTER — Encounter: Payer: Self-pay | Admitting: Hematology and Oncology

## 2015-03-03 DIAGNOSIS — Z96641 Presence of right artificial hip joint: Secondary | ICD-10-CM | POA: Insufficient documentation

## 2015-04-12 ENCOUNTER — Inpatient Hospital Stay: Payer: Medicare Other | Attending: Hematology and Oncology

## 2015-04-12 DIAGNOSIS — D509 Iron deficiency anemia, unspecified: Secondary | ICD-10-CM | POA: Diagnosis not present

## 2015-04-12 LAB — CBC WITH DIFFERENTIAL/PLATELET
Basophils Absolute: 0.1 10*3/uL (ref 0–0.1)
Basophils Relative: 1 %
Eosinophils Absolute: 0.3 10*3/uL (ref 0–0.7)
Eosinophils Relative: 5 %
HCT: 33.9 % — ABNORMAL LOW (ref 35.0–47.0)
Hemoglobin: 11 g/dL — ABNORMAL LOW (ref 12.0–16.0)
Lymphocytes Relative: 39 %
Lymphs Abs: 2.4 10*3/uL (ref 1.0–3.6)
MCH: 26.2 pg (ref 26.0–34.0)
MCHC: 32.5 g/dL (ref 32.0–36.0)
MCV: 80.6 fL (ref 80.0–100.0)
Monocytes Absolute: 0.5 10*3/uL (ref 0.2–0.9)
Monocytes Relative: 8 %
Neutro Abs: 2.8 10*3/uL (ref 1.4–6.5)
Neutrophils Relative %: 47 %
Platelets: 316 10*3/uL (ref 150–440)
RBC: 4.21 MIL/uL (ref 3.80–5.20)
RDW: 13.9 % (ref 11.5–14.5)
WBC: 6 10*3/uL (ref 3.6–11.0)

## 2015-04-12 LAB — FERRITIN: Ferritin: 9 ng/mL — ABNORMAL LOW (ref 11–307)

## 2015-04-23 ENCOUNTER — Other Ambulatory Visit: Payer: Self-pay | Admitting: Hematology and Oncology

## 2015-04-23 ENCOUNTER — Telehealth: Payer: Self-pay | Admitting: *Deleted

## 2015-04-23 NOTE — Telephone Encounter (Addendum)
Came in asking for results that she was told she would be called about.  Her Ferrintin has dropped 191 in July to 9 on 10/21

## 2015-04-24 NOTE — Telephone Encounter (Signed)
Per LuAnn patient does not need prior auth for IV Iron

## 2015-04-24 NOTE — Telephone Encounter (Signed)
  Please schedule Venofer as noted in last communication.  Thanks,  M

## 2015-04-26 ENCOUNTER — Inpatient Hospital Stay: Payer: Medicare Other | Attending: Hematology and Oncology

## 2015-04-26 VITALS — BP 122/70 | HR 79 | Resp 20

## 2015-04-26 DIAGNOSIS — Z79899 Other long term (current) drug therapy: Secondary | ICD-10-CM | POA: Insufficient documentation

## 2015-04-26 DIAGNOSIS — D509 Iron deficiency anemia, unspecified: Secondary | ICD-10-CM | POA: Diagnosis present

## 2015-04-26 DIAGNOSIS — D649 Anemia, unspecified: Secondary | ICD-10-CM

## 2015-04-26 MED ORDER — SODIUM CHLORIDE 0.9 % IV SOLN
Freq: Once | INTRAVENOUS | Status: AC
  Administered 2015-04-26: 14:00:00 via INTRAVENOUS
  Filled 2015-04-26: qty 1000

## 2015-04-26 MED ORDER — SODIUM CHLORIDE 0.9 % IV SOLN
200.0000 mg | Freq: Once | INTRAVENOUS | Status: AC
  Administered 2015-04-26: 200 mg via INTRAVENOUS
  Filled 2015-04-26: qty 10

## 2015-04-30 NOTE — Telephone Encounter (Signed)
Patient is scheduled for first venofer treatment on 05/03/15. She also has subsequent venofer treatments scheduled.

## 2015-05-02 ENCOUNTER — Ambulatory Visit: Payer: Medicare Other

## 2015-05-03 ENCOUNTER — Inpatient Hospital Stay: Payer: Medicare Other

## 2015-05-03 ENCOUNTER — Other Ambulatory Visit: Payer: Self-pay | Admitting: Hematology and Oncology

## 2015-05-03 VITALS — BP 137/77 | HR 82 | Resp 20

## 2015-05-03 DIAGNOSIS — D649 Anemia, unspecified: Secondary | ICD-10-CM

## 2015-05-03 DIAGNOSIS — D509 Iron deficiency anemia, unspecified: Secondary | ICD-10-CM | POA: Diagnosis not present

## 2015-05-03 MED ORDER — SODIUM CHLORIDE 0.9 % IJ SOLN
3.0000 mL | Freq: Once | INTRAMUSCULAR | Status: DC | PRN
  Filled 2015-05-03: qty 10

## 2015-05-03 MED ORDER — SODIUM CHLORIDE 0.9 % IV SOLN
Freq: Once | INTRAVENOUS | Status: AC
  Administered 2015-05-03: 14:00:00 via INTRAVENOUS
  Filled 2015-05-03: qty 1000

## 2015-05-03 MED ORDER — SODIUM CHLORIDE 0.9 % IV SOLN
200.0000 mg | Freq: Once | INTRAVENOUS | Status: AC
  Administered 2015-05-03: 200 mg via INTRAVENOUS
  Filled 2015-05-03: qty 10

## 2015-05-03 MED ORDER — SODIUM CHLORIDE 0.9 % IJ SOLN
10.0000 mL | INTRAMUSCULAR | Status: DC | PRN
Start: 2015-05-03 — End: 2015-05-03
  Filled 2015-05-03: qty 10

## 2015-05-08 ENCOUNTER — Other Ambulatory Visit: Payer: Self-pay

## 2015-05-08 DIAGNOSIS — D649 Anemia, unspecified: Secondary | ICD-10-CM

## 2015-05-09 ENCOUNTER — Ambulatory Visit: Payer: Medicare Other

## 2015-05-10 ENCOUNTER — Inpatient Hospital Stay: Payer: Medicare Other

## 2015-05-10 ENCOUNTER — Other Ambulatory Visit: Payer: Self-pay | Admitting: Hematology and Oncology

## 2015-05-10 VITALS — BP 147/76 | HR 82 | Temp 96.7°F

## 2015-05-10 DIAGNOSIS — D509 Iron deficiency anemia, unspecified: Secondary | ICD-10-CM | POA: Diagnosis not present

## 2015-05-10 DIAGNOSIS — D649 Anemia, unspecified: Secondary | ICD-10-CM

## 2015-05-10 LAB — CBC WITH DIFFERENTIAL/PLATELET
Basophils Absolute: 0.1 10*3/uL (ref 0–0.1)
Basophils Relative: 1 %
Eosinophils Absolute: 0.3 10*3/uL (ref 0–0.7)
Eosinophils Relative: 5 %
HCT: 35.7 % (ref 35.0–47.0)
Hemoglobin: 11.6 g/dL — ABNORMAL LOW (ref 12.0–16.0)
Lymphocytes Relative: 44 %
Lymphs Abs: 2.7 10*3/uL (ref 1.0–3.6)
MCH: 26.4 pg (ref 26.0–34.0)
MCHC: 32.6 g/dL (ref 32.0–36.0)
MCV: 81.2 fL (ref 80.0–100.0)
Monocytes Absolute: 0.5 10*3/uL (ref 0.2–0.9)
Monocytes Relative: 8 %
Neutro Abs: 2.6 10*3/uL (ref 1.4–6.5)
Neutrophils Relative %: 42 %
Platelets: 330 10*3/uL (ref 150–440)
RBC: 4.4 MIL/uL (ref 3.80–5.20)
RDW: 15.9 % — ABNORMAL HIGH (ref 11.5–14.5)
WBC: 6.2 10*3/uL (ref 3.6–11.0)

## 2015-05-10 LAB — FERRITIN: Ferritin: 96 ng/mL (ref 11–307)

## 2015-05-10 MED ORDER — IRON SUCROSE 20 MG/ML IV SOLN
200.0000 mg | Freq: Once | INTRAVENOUS | Status: AC
  Administered 2015-05-10: 200 mg via INTRAVENOUS
  Filled 2015-05-10: qty 10

## 2015-05-10 MED ORDER — SODIUM CHLORIDE 0.9 % IV SOLN
Freq: Once | INTRAVENOUS | Status: AC
  Administered 2015-05-10: 14:00:00 via INTRAVENOUS
  Filled 2015-05-10: qty 1000

## 2015-05-15 ENCOUNTER — Other Ambulatory Visit: Payer: Medicare Other

## 2015-05-15 ENCOUNTER — Ambulatory Visit: Payer: Medicare Other

## 2015-05-21 ENCOUNTER — Other Ambulatory Visit: Payer: Self-pay | Admitting: Hematology and Oncology

## 2015-05-21 ENCOUNTER — Other Ambulatory Visit: Payer: Self-pay

## 2015-05-21 DIAGNOSIS — D649 Anemia, unspecified: Secondary | ICD-10-CM

## 2015-05-23 ENCOUNTER — Other Ambulatory Visit: Payer: Self-pay | Admitting: Hematology and Oncology

## 2015-05-23 ENCOUNTER — Inpatient Hospital Stay: Payer: Medicare Other

## 2015-05-23 ENCOUNTER — Inpatient Hospital Stay (HOSPITAL_BASED_OUTPATIENT_CLINIC_OR_DEPARTMENT_OTHER): Payer: Medicare Other | Admitting: Hematology and Oncology

## 2015-05-23 ENCOUNTER — Inpatient Hospital Stay: Payer: Medicare Other | Attending: Hematology and Oncology

## 2015-05-23 VITALS — BP 154/78 | HR 88 | Temp 95.8°F | Resp 18 | Ht 68.0 in | Wt 194.4 lb

## 2015-05-23 DIAGNOSIS — R609 Edema, unspecified: Secondary | ICD-10-CM | POA: Insufficient documentation

## 2015-05-23 DIAGNOSIS — K219 Gastro-esophageal reflux disease without esophagitis: Secondary | ICD-10-CM

## 2015-05-23 DIAGNOSIS — D649 Anemia, unspecified: Secondary | ICD-10-CM

## 2015-05-23 DIAGNOSIS — D509 Iron deficiency anemia, unspecified: Secondary | ICD-10-CM

## 2015-05-23 DIAGNOSIS — R011 Cardiac murmur, unspecified: Secondary | ICD-10-CM | POA: Diagnosis not present

## 2015-05-23 DIAGNOSIS — M129 Arthropathy, unspecified: Secondary | ICD-10-CM | POA: Insufficient documentation

## 2015-05-23 DIAGNOSIS — F329 Major depressive disorder, single episode, unspecified: Secondary | ICD-10-CM | POA: Diagnosis not present

## 2015-05-23 DIAGNOSIS — Z79899 Other long term (current) drug therapy: Secondary | ICD-10-CM | POA: Insufficient documentation

## 2015-05-23 DIAGNOSIS — Z800 Family history of malignant neoplasm of digestive organs: Secondary | ICD-10-CM

## 2015-05-23 DIAGNOSIS — K900 Celiac disease: Secondary | ICD-10-CM | POA: Diagnosis not present

## 2015-05-23 DIAGNOSIS — Z8781 Personal history of (healed) traumatic fracture: Secondary | ICD-10-CM | POA: Diagnosis not present

## 2015-05-23 LAB — CBC WITH DIFFERENTIAL/PLATELET
Basophils Absolute: 0.1 10*3/uL (ref 0–0.1)
Basophils Relative: 1 %
Eosinophils Absolute: 0.2 10*3/uL (ref 0–0.7)
Eosinophils Relative: 4 %
HCT: 37.2 % (ref 35.0–47.0)
Hemoglobin: 12.3 g/dL (ref 12.0–16.0)
Lymphocytes Relative: 42 %
Lymphs Abs: 2.4 10*3/uL (ref 1.0–3.6)
MCH: 27 pg (ref 26.0–34.0)
MCHC: 33.1 g/dL (ref 32.0–36.0)
MCV: 81.6 fL (ref 80.0–100.0)
Monocytes Absolute: 0.4 10*3/uL (ref 0.2–0.9)
Monocytes Relative: 8 %
Neutro Abs: 2.6 10*3/uL (ref 1.4–6.5)
Neutrophils Relative %: 45 %
Platelets: 310 10*3/uL (ref 150–440)
RBC: 4.55 MIL/uL (ref 3.80–5.20)
RDW: 17.7 % — ABNORMAL HIGH (ref 11.5–14.5)
WBC: 5.8 10*3/uL (ref 3.6–11.0)

## 2015-05-23 LAB — IRON AND TIBC
Iron: 43 ug/dL (ref 28–170)
Saturation Ratios: 16 % (ref 10.4–31.8)
TIBC: 271 ug/dL (ref 250–450)
UIBC: 228 ug/dL

## 2015-05-23 LAB — FERRITIN: Ferritin: 88 ng/mL (ref 11–307)

## 2015-05-23 NOTE — Progress Notes (Signed)
[place].  Date:  05/23/2015   Chief Complaint: Valerie Duran is a 69 y.o. female with chronic anemia who is seen for 3 month assessment after completion of IV iron (Venofer).  HPI:  The patient was last seen in the medical oncology clinic on 01/10/2015.  At that time, she felt fatigued.  Labs included a hematocrit of 37.9, hemoglobin 12.0 and MCV 77.1. Ferritin was 191.  She had reached her goal, and no further Venofer was given.  On 01/14/2015, she underwent removal of 3 cannulated screws and conversion to a right total hip arthroplasty by Dr. Skip Estimable.  She tolerated her surgery well. She notes a little swelling in her legs. She notes being tired a lot and trouble sleeping.  She is followed by Dr. Donnella Sham in the gastroenterology. She notes celiac disease. A gluten-free diet decreases inflammation. When she has glutens, she notes some swelling and abdominal pain.   Past Medical History  Diagnosis Date  . Anemia   . Heart murmur   . Arthritis   . Celiac disease   . Depression   . GERD (gastroesophageal reflux disease)     Past Surgical History  Procedure Laterality Date  . Abdominal hysterectomy    . Joint replacement Bilateral 2014    knees  . Hip fracture surgery Right 2016  . Foot surgery Right 2007  . Cataract extraction w/ intraocular lens  implant, bilateral Bilateral   . Cesarean section [place]  . Back surgery      spinal fusion  . Neck surgery      fusion  . Total hip arthroplasty Right 01/14/2015    Procedure: TOTAL HIP ARTHROPLASTY;  Surgeon: Dereck Leep, MD;  Location: [facility] ORS;  Service: Orthopedics;  Laterality: Right;  . Hardware removal Right 01/14/2015    Procedure: HARDWARE REMOVAL/ REMOVAL OF 3 CANNULATED SCREWS FROM RIGHT HIP.;  Surgeon: Dereck Leep, MD;  Location: [facility] ORS;  Service: Orthopedics;  Laterality: Right;    Family History  Problem Relation Age of Onset  . Cancer Father 52    Colon Cancer    Social  History:  reports that she has never smoked. She does not have any smokeless tobacco history on file. She reports that she does not drink alcohol or use illicit drugs.  The patient is accompanied by her husband, Fulton Reek.  Allergies:  Allergies  Allergen Reactions  . Ambien [Zolpidem Tartrate] Other (See Comments)    Altered mental status  . Barley Grass Other (See Comments)    Celiac disease  . Codeine Nausea Only  . Tape Other (See Comments)    Adhesive tape - tears skin  . Valium [Diazepam] Other (See Comments)    Sleep walks  . Ibuprofen Other (See Comments) and Rash    GI upset " makes my stomach hurt"  . Simvastatin Rash  . Wheat Bran Rash    Celiac disease    Current Medications: Current Outpatient Prescriptions  Medication Sig Dispense Refill  . Calcium Carbonate-Vitamin D (CALCIUM 600 + D PO) Take 1 tablet by mouth 2 (two) times daily.     . Calcium-Vitamin D 600-200 MG-UNIT per tablet Take by mouth.    Marland Kitchen HYDROcodone-acetaminophen (LORTAB) 10-500 MG per tablet Take 1 tablet by mouth every 4 (four) hours as needed (0.5-1 tab as needed for pain). For pain    . Magnesium 500 MG CAPS Take 1 capsule by mouth every morning.     . Menthol, Topical  Analgesic, (ZIMS MAX-FREEZE EX) Apply 1 spray topically 4 (four) times daily as needed.    . mirtazapine (REMERON) 15 MG tablet Take by mouth.    . oxyCODONE (OXY IR/ROXICODONE) 5 MG immediate release tablet Take 1-2 tablets (5-10 mg total) by mouth every 3 (three) hours as needed for severe pain. 30 tablet 0  . OxyCODONE (OXYCONTIN) 10 mg T12A 12 hr tablet Take 1 tablet (10 mg total) by mouth every 12 (twelve) hours. 20 tablet 0  . pantoprazole (PROTONIX) 40 MG tablet qam    . potassium chloride (K-DUR,KLOR-CON) 10 MEQ tablet Take by mouth.    . pramipexole (MIRAPEX) 1 MG tablet Take 1 tablet by mouth 2 (two) times daily.     Marland Kitchen torsemide (DEMADEX) 10 MG tablet Take 10 mg by mouth every morning.    . traMADol (ULTRAM) 50 MG tablet Take  1-2 tablets (50-100 mg total) by mouth every 4 (four) hours as needed for moderate pain. 30 tablet 0  . enoxaparin (LOVENOX) 30 MG/0.3ML injection Inject 0.3 mLs (30 mg total) into the skin every 12 (twelve) hours. 28 Syringe 0   No current facility-administered medications for this visit.   Facility-Administered Medications Ordered in Other Visits  Medication Dose Route Frequency Provider Last Rate Last Dose  . 0.9 %  sodium chloride infusion   Intravenous Once Lequita Asal, MD        Review of Systems:  GENERAL:  Fatigue.  No fevers, sweats or weight loss.  Weight fluctuates up and down. PERFORMANCE STATUS (ECOG): 1-2. HEENT:  No visual changes, runny nose, sore throat, mouth sores or tenderness. Lungs: No shortness of breath or cough.  No hemoptysis. Cardiac:  No chest pain, palpitations, orthopnea, or PND. GI:  No nausea, vomiting, diarrhea, constipation, melena or hematochezia. GU:  No urgency, frequency, dysuria, or hematuria. Musculoskeletal: Hip surgery.  Arthritis in joints.  No muscle tenderness. Extremities:  Little leg swelling. Skin:  No rashes or skin changes. Neuro:  No headache, numbness or weakness, balance or coordination issues. Endocrine:  No diabetes, thyroid issues, hot flashes or night sweats. Psych:  No mood changes, depression or anxiety. Pain:  No focal pain. Review of systems:  All other systems reviewed and found to be negative.  Physical Exam: Blood pressure 154/78, pulse 88, temperature 95.8 F (35.4 C), temperature source Tympanic, resp. rate 18, height 5' 8"  (1.727 m), weight 194 lb 7.1 oz (88.2 kg). GENERAL:  Chronically fatigued appearing woman sitting comfortably in the exam room in no acute distress. MENTAL STATUS:  Alert and oriented to person, place and time. HEAD:  Shoulder length brown hair.  Normocephalic, atraumatic, face symmetric, no Cushingoid features. EYES:  Blue eyes.  Pupils equal round and reactive to light and accomodation.   Amblyopia.  No conjunctivitis or scleral icterus. ENT:  Oropharynx clear without lesion.  Tongue normal. Mucous membranes moist.  RESPIRATORY:  Clear to auscultation without rales, wheezes or rhonchi. CARDIOVASCULAR:  Regular rate and rhythm without murmur, rub or gallop. ABDOMEN:  Soft, non-tender, with active bowel sounds, and no hepatosplenomegaly.  No masses. SKIN:  No rashes, ulcers or lesions. EXTREMITIES: Chronic mild lower extremity edema.  No skin discoloration or tenderness.  No palpable cords. LYMPH NODES: No palpable cervical, supraclavicular, axillary or inguinal adenopathy  NEUROLOGICAL: Unremarkable. PSYCH:  Appropriate.   Appointment on 05/23/2015  Component Date Value Ref Range Status  . WBC 05/23/2015 5.8  3.6 - 11.0 K/uL Final  . RBC 05/23/2015 4.55  3.80 -  5.20 MIL/uL Final  . Hemoglobin 05/23/2015 12.3  12.0 - 16.0 g/dL Final  . HCT 05/23/2015 37.2  35.0 - 47.0 % Final  . MCV 05/23/2015 81.6  80.0 - 100.0 fL Final  . MCH 05/23/2015 27.0  26.0 - 34.0 pg Final  . MCHC 05/23/2015 33.1  32.0 - 36.0 g/dL Final  . RDW 05/23/2015 17.7* 11.5 - 14.5 % Final  . Platelets 05/23/2015 310  150 - 440 K/uL Final  . Neutrophils Relative % 05/23/2015 45   Final  . Neutro Abs 05/23/2015 2.6  1.4 - 6.5 K/uL Final  . Lymphocytes Relative 05/23/2015 42   Final  . Lymphs Abs 05/23/2015 2.4  1.0 - 3.6 K/uL Final  . Monocytes Relative 05/23/2015 8   Final  . Monocytes Absolute 05/23/2015 0.4  0.2 - 0.9 K/uL Final  . Eosinophils Relative 05/23/2015 4   Final  . Eosinophils Absolute 05/23/2015 0.2  0 - 0.7 K/uL Final  . Basophils Relative 05/23/2015 1   Final  . Basophils Absolute 05/23/2015 0.1  0 - 0.1 K/uL Final  . Ferritin 05/23/2015 88  11 - 307 ng/mL Final  . Iron 05/23/2015 43  28 - 170 ug/dL Final  . TIBC 05/23/2015 271  250 - 450 ug/dL Final  . Saturation Ratios 05/23/2015 16  10.4 - 31.8 % Final  . UIBC 05/23/2015 228   Final    Assessment:  MARKIYAH GAHM is a 69 y.o.  female with celiac disease and difficulty absorbing nutrients.  She has a history of progressive anemia.  EGD and colonoscopy in 2011 revealed no apparent abnormality.  Diet is modest.  She denies any melena or hematochezia.  Labs on 12/13/2014 revealed severe iron deficiency with a hematocrit of 27.9, hemoglobin 8.5, MCV 67.9, and ferritin of 8.    She received 2 units of PRBCs on 12/14/2014.  She received weekly Venofer 200 mg (06/30, 07/07, 01/03/2015).  She tolerated her infusions well.  Hematocrit is 37.9 with a ferritin of 191 today.  She fell on 08/05/2014 and fractured her hip.  She underwent right total hip arthroplasty on 01/14/2015  Symptomatically, she is fatigued.  Exam is stable.  Hematocrit is normal.  Ferritin is 88.  Plan: 1.  Labs today:  CBC with diff, ferritin, iron studies.   2.  RTC in 3 months for MD assessment, labs (CBC with diff, ferritin, and  ESR) +/- Venofer next day.   Lequita Asal, MD  05/23/2015, 2:11 PM

## 2015-06-28 ENCOUNTER — Encounter: Payer: Self-pay | Admitting: Hematology and Oncology

## 2015-07-16 ENCOUNTER — Other Ambulatory Visit: Payer: Medicare Other

## 2015-07-16 ENCOUNTER — Ambulatory Visit: Payer: Medicare Other | Admitting: Hematology and Oncology

## 2015-08-22 ENCOUNTER — Inpatient Hospital Stay: Payer: Medicare Other

## 2015-08-22 ENCOUNTER — Inpatient Hospital Stay: Payer: Medicare Other | Admitting: Hematology and Oncology

## 2015-08-22 ENCOUNTER — Other Ambulatory Visit: Payer: Self-pay | Admitting: Hematology and Oncology

## 2015-08-22 ENCOUNTER — Inpatient Hospital Stay: Payer: Medicare Other | Attending: Hematology and Oncology

## 2015-08-22 DIAGNOSIS — Z79899 Other long term (current) drug therapy: Secondary | ICD-10-CM | POA: Insufficient documentation

## 2015-08-22 DIAGNOSIS — D649 Anemia, unspecified: Secondary | ICD-10-CM | POA: Insufficient documentation

## 2015-08-22 DIAGNOSIS — M129 Arthropathy, unspecified: Secondary | ICD-10-CM | POA: Insufficient documentation

## 2015-08-22 DIAGNOSIS — K900 Celiac disease: Secondary | ICD-10-CM | POA: Insufficient documentation

## 2015-08-22 DIAGNOSIS — R5383 Other fatigue: Secondary | ICD-10-CM | POA: Insufficient documentation

## 2015-08-22 DIAGNOSIS — K219 Gastro-esophageal reflux disease without esophagitis: Secondary | ICD-10-CM | POA: Insufficient documentation

## 2015-08-22 DIAGNOSIS — F329 Major depressive disorder, single episode, unspecified: Secondary | ICD-10-CM | POA: Insufficient documentation

## 2015-08-29 ENCOUNTER — Inpatient Hospital Stay: Payer: Medicare Other

## 2015-08-29 ENCOUNTER — Inpatient Hospital Stay (HOSPITAL_BASED_OUTPATIENT_CLINIC_OR_DEPARTMENT_OTHER): Payer: Medicare Other | Admitting: Hematology and Oncology

## 2015-08-29 VITALS — BP 167/92 | HR 112 | Temp 98.0°F | Resp 18 | Ht 68.0 in | Wt 198.6 lb

## 2015-08-29 DIAGNOSIS — D649 Anemia, unspecified: Secondary | ICD-10-CM

## 2015-08-29 DIAGNOSIS — F329 Major depressive disorder, single episode, unspecified: Secondary | ICD-10-CM | POA: Diagnosis not present

## 2015-08-29 DIAGNOSIS — K219 Gastro-esophageal reflux disease without esophagitis: Secondary | ICD-10-CM

## 2015-08-29 DIAGNOSIS — D509 Iron deficiency anemia, unspecified: Secondary | ICD-10-CM

## 2015-08-29 DIAGNOSIS — Z79899 Other long term (current) drug therapy: Secondary | ICD-10-CM

## 2015-08-29 DIAGNOSIS — R5383 Other fatigue: Secondary | ICD-10-CM | POA: Diagnosis not present

## 2015-08-29 DIAGNOSIS — K900 Celiac disease: Secondary | ICD-10-CM | POA: Diagnosis not present

## 2015-08-29 DIAGNOSIS — M129 Arthropathy, unspecified: Secondary | ICD-10-CM

## 2015-08-29 LAB — COMPREHENSIVE METABOLIC PANEL
ALT: 20 U/L (ref 14–54)
AST: 26 U/L (ref 15–41)
Albumin: 3.8 g/dL (ref 3.5–5.0)
Alkaline Phosphatase: 91 U/L (ref 38–126)
Anion gap: 5 (ref 5–15)
BUN: 12 mg/dL (ref 6–20)
CO2: 27 mmol/L (ref 22–32)
Calcium: 9.1 mg/dL (ref 8.9–10.3)
Chloride: 100 mmol/L — ABNORMAL LOW (ref 101–111)
Creatinine, Ser: 0.72 mg/dL (ref 0.44–1.00)
GFR calc Af Amer: >60 mL/min (ref >60)
GFR calc non Af Amer: >60 mL/min (ref >60)
Glucose, Bld: 193 mg/dL — ABNORMAL HIGH (ref 65–99)
Potassium: 4.3 mmol/L (ref 3.5–5.1)
Sodium: 132 mmol/L — ABNORMAL LOW (ref 135–145)
Total Bilirubin: 0.6 mg/dL (ref 0.3–1.2)
Total Protein: 7.8 g/dL (ref 6.5–8.1)

## 2015-08-29 LAB — CBC WITH DIFFERENTIAL/PLATELET
Basophils Absolute: 0.1 10*3/uL (ref 0–0.1)
Basophils Relative: 1 %
Eosinophils Absolute: 0.2 10*3/uL (ref 0–0.7)
Eosinophils Relative: 3 %
HCT: 37.3 % (ref 35.0–47.0)
Hemoglobin: 12.8 g/dL (ref 12.0–16.0)
Lymphocytes Relative: 29 %
Lymphs Abs: 1.8 10*3/uL (ref 1.0–3.6)
MCH: 30.2 pg (ref 26.0–34.0)
MCHC: 34.3 g/dL (ref 32.0–36.0)
MCV: 88 fL (ref 80.0–100.0)
Monocytes Absolute: 0.4 10*3/uL (ref 0.2–0.9)
Monocytes Relative: 7 %
Neutro Abs: 3.8 10*3/uL (ref 1.4–6.5)
Neutrophils Relative %: 60 %
Platelets: 323 10*3/uL (ref 150–440)
RBC: 4.24 MIL/uL (ref 3.80–5.20)
RDW: 13.5 % (ref 11.5–14.5)
WBC: 6.2 10*3/uL (ref 3.6–11.0)

## 2015-08-29 LAB — SEDIMENTATION RATE: Sed Rate: 48 mm/hr — ABNORMAL HIGH (ref 0–30)

## 2015-08-29 LAB — IRON AND TIBC
Iron: 66 ug/dL (ref 28–170)
Saturation Ratios: 21 % (ref 10.4–31.8)
TIBC: 313 ug/dL (ref 250–450)
UIBC: 247 ug/dL

## 2015-08-29 LAB — FERRITIN: Ferritin: 44 ng/mL (ref 11–307)

## 2015-08-29 NOTE — Progress Notes (Signed)
[place].  Date:  08/29/2015   Chief Complaint: Valerie Duran is a 69 y.o. female with chronic anemia who is seen for 3 month assessment.  HPI:  The patient was last seen in the medical oncology clinic on 05/23/2015.  At that time, she was fatigued.  She was recovering from a right hip replacement on 01/14/2015.  Exam was stable.  Hematocrit was normal.  Ferritin was 88.    During the interim, her energy level has been "ok".  She notes that she is on the go all of the time.  She notes bad arthritis.  She has gained weight.  She has a good appetite.  She tries to eat food with iron in it.   She notes that she is "just about over hip surgery".   She denies any melena or hematochezia.  Past Medical History  Diagnosis Date  . Anemia   . Heart murmur   . Arthritis   . Celiac disease   . Depression   . GERD (gastroesophageal reflux disease)     Past Surgical History  Procedure Laterality Date  . Abdominal hysterectomy    . Joint replacement Bilateral 2014    knees  . Hip fracture surgery Right 2016  . Foot surgery Right 2007  . Cataract extraction w/ intraocular lens  implant, bilateral Bilateral   . Cesarean section [place]  . Back surgery      spinal fusion  . Neck surgery      fusion  . Total hip arthroplasty Right 01/14/2015    Procedure: TOTAL HIP ARTHROPLASTY;  Surgeon: Dereck Leep, MD;  Location: [facility] ORS;  Service: Orthopedics;  Laterality: Right;  . Hardware removal Right 01/14/2015    Procedure: HARDWARE REMOVAL/ REMOVAL OF 3 CANNULATED SCREWS FROM RIGHT HIP.;  Surgeon: Dereck Leep, MD;  Location: [facility] ORS;  Service: Orthopedics;  Laterality: Right;    Family History  Problem Relation Age of Onset  . Cancer Father 10    Colon Cancer    Social History:  reports that she has never smoked. She does not have any smokeless tobacco history on file. She reports that she does not drink alcohol or use illicit drugs.  The patient is alone  today.  Allergies:  Allergies  Allergen Reactions  . Ambien [Zolpidem Tartrate] Other (See Comments)    Altered mental status  . Barley Grass Other (See Comments)    Celiac disease  . Codeine Nausea Only  . Tape Other (See Comments)    Adhesive tape - tears skin  . Valium [Diazepam] Other (See Comments)    Sleep walks  . Ibuprofen Other (See Comments) and Rash    GI upset " makes my stomach hurt"  . Simvastatin Rash  . Wheat Bran Rash    Celiac disease    Current Medications: Current Outpatient Prescriptions  Medication Sig Dispense Refill  . Calcium Carbonate-Vitamin D (CALCIUM 600 + D PO) Take 1 tablet by mouth 2 (two) times daily.     . Calcium-Vitamin D 600-200 MG-UNIT per tablet Take by mouth.    Marland Kitchen HYDROcodone-acetaminophen (LORTAB) 10-500 MG per tablet Take 1 tablet by mouth every 4 (four) hours as needed (0.5-1 tab as needed for pain). For pain    . Magnesium 500 MG CAPS Take 1 capsule by mouth every morning.     . Menthol, Topical Analgesic, (ZIMS MAX-FREEZE EX) Apply 1 spray topically 4 (four) times daily as needed.    Marland Kitchen  mirtazapine (REMERON) 15 MG tablet Take by mouth.    . oxyCODONE (OXY IR/ROXICODONE) 5 MG immediate release tablet Take 1-2 tablets (5-10 mg total) by mouth every 3 (three) hours as needed for severe pain. 30 tablet 0  . OxyCODONE (OXYCONTIN) 10 mg T12A 12 hr tablet Take 1 tablet (10 mg total) by mouth every 12 (twelve) hours. 20 tablet 0  . pantoprazole (PROTONIX) 40 MG tablet qam    . pramipexole (MIRAPEX) 1 MG tablet Take 1 tablet by mouth 2 (two) times daily.     . traMADol (ULTRAM) 50 MG tablet Take 1-2 tablets (50-100 mg total) by mouth every 4 (four) hours as needed for moderate pain. 30 tablet 0   No current facility-administered medications for this visit.   Facility-Administered Medications Ordered in Other Visits  Medication Dose Route Frequency Provider Last Rate Last Dose  . 0.9 %  sodium chloride infusion   Intravenous Once Lequita Asal, MD        Review of Systems:  GENERAL:  Energy comes and goes.  No fevers, sweats or weight loss.  Weight up 4 pounds since last visit. PERFORMANCE STATUS (ECOG): 1-2. HEENT:  No visual changes, runny nose, sore throat, mouth sores or tenderness. Lungs:  Shortness of breath with exertion.  No cough.  No hemoptysis. Cardiac:  No chest pain, palpitations, orthopnea, or PND. GI:  No nausea, vomiting, diarrhea, constipation, melena or hematochezia. GU:  No urgency, frequency, dysuria, or hematuria. Musculoskeletal: Arthritis in joints.  No muscle tenderness. Extremities:  No pain or swelling. Skin:  No rashes or skin changes. Neuro:  No headache, numbness or weakness, balance or coordination issues. Endocrine:  No diabetes, thyroid issues, hot flashes or night sweats. Psych:  No mood changes, depression or anxiety. Pain:  No focal pain. Review of systems:  All other systems reviewed and found to be negative.  Physical Exam: Blood pressure 167/92, pulse 112, temperature 98 F (36.7 C), temperature source Tympanic, resp. rate 18, height 5' 8"  (1.727 m), weight 198 lb 10.2 oz (90.1 kg). GENERAL:  Chronically fatigued appearing woman sitting comfortably in the exam room in no acute distress. MENTAL STATUS:  Alert and oriented to person, place and time. HEAD:  Owens Shark styled hair.  Normocephalic, atraumatic, face symmetric, no Cushingoid features. EYES:  Blue eyes.  Pupils equal round and reactive to light and accomodation.  Amblyopia.  No conjunctivitis or scleral icterus. ENT:  Oropharynx clear without lesion.  Tongue normal. Mucous membranes moist.  RESPIRATORY:  Clear to auscultation without rales, wheezes or rhonchi. CARDIOVASCULAR:  Regular rate and rhythm without murmur, rub or gallop. ABDOMEN:  Soft, non-tender, with active bowel sounds, and no hepatosplenomegaly.  No masses. SKIN:  No rashes, ulcers or lesions. EXTREMITIES: Chronic mild lower extremity changes.  No skin  discoloration or tenderness.  No palpable cords. LYMPH NODES: No palpable cervical, supraclavicular, axillary or inguinal adenopathy  NEUROLOGICAL: Unremarkable. PSYCH:  Appropriate.   Appointment on 08/29/2015  Component Date Value Ref Range Status  . Sodium 08/29/2015 132* 135 - 145 mmol/L Final  . Potassium 08/29/2015 4.3  3.5 - 5.1 mmol/L Final  . Chloride 08/29/2015 100* 101 - 111 mmol/L Final  . CO2 08/29/2015 27  22 - 32 mmol/L Final  . Glucose, Bld 08/29/2015 193* 65 - 99 mg/dL Final  . BUN 08/29/2015 12  6 - 20 mg/dL Final  . Creatinine, Ser 08/29/2015 0.72  0.44 - 1.00 mg/dL Final  . Calcium 08/29/2015 9.1  8.9 -  10.3 mg/dL Final  . Total Protein 08/29/2015 7.8  6.5 - 8.1 g/dL Final  . Albumin 08/29/2015 3.8  3.5 - 5.0 g/dL Final  . AST 08/29/2015 26  15 - 41 U/L Final  . ALT 08/29/2015 20  14 - 54 U/L Final  . Alkaline Phosphatase 08/29/2015 91  38 - 126 U/L Final  . Total Bilirubin 08/29/2015 0.6  0.3 - 1.2 mg/dL Final  . GFR calc non Af Amer 08/29/2015 >60  >60 mL/min Final  . GFR calc Af Amer 08/29/2015 >60  >60 mL/min Final   Comment: (NOTE) The eGFR has been calculated using the CKD EPI equation. This calculation has not been validated in all clinical situations. eGFR's persistently <60 mL/min signify possible Chronic Kidney Disease.   . Anion gap 08/29/2015 5  5 - 15 Final    Assessment:  Valerie Duran is a 70 y.o. female with celiac disease and difficulty absorbing nutrients.  She has a history of progressive anemia.  EGD and colonoscopy in 2011 revealed no apparent abnormality.  Diet is modest.  She denies any melena or hematochezia.  Labs on 12/13/2014 revealed severe iron deficiency with a hematocrit of 27.9, hemoglobin 8.5, MCV 67.9, and ferritin of 8.  She received 2 units of PRBCs on 12/14/2014.    She received Venofer 600 mg (12/20/2014 - 01/03/2015) and 600 mg (04/26/2015 - 05/10/2015).  Ferritin was 96 on 05/10/2015, 88 on 05/23/2015, and 44 on  08/29/2015.  She fell on 08/05/2014 and fractured her hip.  She underwent right total hip arthroplasty on 01/14/2015.  Symptomatically, her energy level is up and down.  She is constantly on the "go".  Exam is stable.  Hematocrit is 37.3.  MCV is normal.  Ferritin is 44.  Plan: 1.  Labs today:  CBC with diff, CMP, ferritin, iron studies.   2.  RTC in 3 months for labs (CBC with diff, ferritin) and +/- Venofer next day. 3.  RTC in 6 months for MD assessment, labs (CBC with diff, ferritin) and +/- Venofer next day.   Lequita Asal, MD  08/29/2015, 2:13 PM

## 2015-09-16 ENCOUNTER — Encounter: Payer: Self-pay | Admitting: Hematology and Oncology

## 2015-11-28 ENCOUNTER — Inpatient Hospital Stay: Payer: Medicare Other | Attending: Hematology and Oncology

## 2015-11-28 DIAGNOSIS — D649 Anemia, unspecified: Secondary | ICD-10-CM

## 2015-11-28 DIAGNOSIS — D509 Iron deficiency anemia, unspecified: Secondary | ICD-10-CM | POA: Diagnosis present

## 2015-11-28 LAB — CBC WITH DIFFERENTIAL/PLATELET
Basophils Absolute: 0.1 10*3/uL (ref 0–0.1)
Basophils Relative: 1 %
Eosinophils Absolute: 0.2 10*3/uL (ref 0–0.7)
Eosinophils Relative: 3 %
HCT: 40.2 % (ref 35.0–47.0)
Hemoglobin: 13.7 g/dL (ref 12.0–16.0)
Lymphocytes Relative: 30 %
Lymphs Abs: 2 10*3/uL (ref 1.0–3.6)
MCH: 30 pg (ref 26.0–34.0)
MCHC: 34.2 g/dL (ref 32.0–36.0)
MCV: 87.6 fL (ref 80.0–100.0)
Monocytes Absolute: 0.5 10*3/uL (ref 0.2–0.9)
Monocytes Relative: 7 %
Neutro Abs: 3.8 10*3/uL (ref 1.4–6.5)
Neutrophils Relative %: 59 %
Platelets: 316 10*3/uL (ref 150–440)
RBC: 4.59 MIL/uL (ref 3.80–5.20)
RDW: 12.6 % (ref 11.5–14.5)
WBC: 6.6 10*3/uL (ref 3.6–11.0)

## 2015-11-28 LAB — FERRITIN: Ferritin: 44 ng/mL (ref 11–307)

## 2015-11-29 ENCOUNTER — Other Ambulatory Visit: Payer: Self-pay | Admitting: Hematology and Oncology

## 2015-11-29 ENCOUNTER — Inpatient Hospital Stay: Payer: Medicare Other

## 2016-03-02 ENCOUNTER — Inpatient Hospital Stay: Payer: Medicare Other | Admitting: Hematology and Oncology

## 2016-03-02 ENCOUNTER — Inpatient Hospital Stay: Payer: Medicare Other

## 2016-03-03 ENCOUNTER — Inpatient Hospital Stay: Payer: Medicare Other

## 2016-03-08 ENCOUNTER — Other Ambulatory Visit: Payer: Self-pay | Admitting: Hematology and Oncology

## 2016-03-08 NOTE — Progress Notes (Signed)
Valerie Duran.  Date:  03/09/16   Chief Complaint: Valerie Duran is a 70 y.o. female with iron deficiency anemia.   HPI:  Patient returns to clinic today for repeat laboratory work, further evaluation, and consideration of additional IV iron. She currently feels weak and fatigued, but states this is chronic and unchanged. She has no neurologic complaints. She denies any recent fevers or illnesses. She has a good appetite and denies weight loss. She has no chest pain or shortness of breath. She denies any nausea, vomiting, constipation, or diarrhea. She denies a ny melena or hematochezia. She has no urinary complaints. Patient offers no further specific complaints today.    Past Medical History:  Diagnosis Date  . Anemia   . Arthritis   . Celiac disease   . Depression   . GERD (gastroesophageal reflux disease)   . Heart murmur     Past Surgical History:  Procedure Laterality Date  . ABDOMINAL HYSTERECTOMY    . BACK SURGERY     spinal fusion  . CATARACT EXTRACTION W/ INTRAOCULAR LENS  IMPLANT, BILATERAL Bilateral   . [place]  . FOOT SURGERY Right 2007  . HARDWARE REMOVAL Right 01/14/2015   Procedure: HARDWARE REMOVAL/ REMOVAL OF 3 CANNULATED SCREWS FROM RIGHT HIP.;  Surgeon: Dereck Leep, MD;  Location: [facility] ORS;  Service: Orthopedics;  Laterality: Right;  . HIP FRACTURE SURGERY Right 2016  . JOINT REPLACEMENT Bilateral 2014   knees  . NECK SURGERY     fusion  . TOTAL HIP ARTHROPLASTY Right 01/14/2015   Procedure: TOTAL HIP ARTHROPLASTY;  Surgeon: Dereck Leep, MD;  Location: [facility] OR[facility];  Service: Orthopedics;  Laterality: Right;    Family History  Problem Relation Age of Onset  . Cancer Father 75    Colon Cancer    Social History:  reports that she has never smoked. She does not have any smokeless tobacco history on file. She reports that she does not drink alcohol or use drugs.  The patient is alone today.  Allergies:  Allergies   Allergen Reactions  . Ambien [Zolpidem Tartrate] Other (See Comments)    Altered mental status  . Barley Grass Other (See Comments)    Celiac disease  . Codeine Nausea Only  . Tape Other (See Comments)    Adhesive tape - tears skin  . Valium [Diazepam] Other (See Comments)    Sleep walks  . Ibuprofen Other (See Comments) and Rash    GI upset " makes my stomach hurt"  . Simvastatin Rash  . Wheat Bran Rash    Celiac disease    Current Medications: Current Outpatient Prescriptions  Medication Sig Dispense Refill  . Calcium Carbonate-Vitamin D (CALCIUM 600 + D PO) Take 1 tablet by mouth 2 (two) times daily.     . DULoxetine (CYMBALTA) 60 MG capsule TAKE 1 CAPSULE BY MOUTH EVERY DAY    . HYDROcodone-acetaminophen (LORTAB) 10-500 MG per tablet Take 1 tablet by mouth every 4 (four) hours as needed (0.5-1 tab as needed for pain). For pain    . Magnesium 500 MG CAPS Take 1 capsule by mouth every morning.     . Menthol, Topical Analgesic, (ZIMS MAX-FREEZE EX) Apply 1 spray topically 4 (four) times daily as needed.    . pantoprazole (PROTONIX) 40 MG tablet qam    . pramipexole (MIRAPEX) 1 MG tablet Take 1 tablet by mouth 2 (two) times daily.      No current facility-administered  medications for this visit.    Facility-Administered Medications Ordered in Other Visits  Medication Dose Route Frequency Provider Last Rate Last Dose  . 0.9 %  sodium chloride infusion   Intravenous Once Lequita Asal, MD        Review of Systems:  GENERAL:  Energy comes and goes.  No fevers, sweats or weight loss.  Weight up 4 pounds since last visit.  PERFORMANCE STATUS (ECOG): 1. Lungs:  Shortness of breath with exertion.  No cough.  No hemoptysis. Cardiac:  No chest pain, palpitations, orthopnea, or PND. GI:  No nausea, vomiting, diarrhea, constipation, melena or hematochezia. GU:  No urgency, frequency, dysuria, or hematuria. Musculoskeletal: Arthritis in joints.  No muscle  tenderness. Extremities:  No pain or swelling. Skin:  No rashes or skin changes. Neuro:  No headache, numbness or weakness, balance or coordination issues. Endocrine:  No diabetes, thyroid issues, hot flashes or night sweats. Psych:  No mood changes, depression or anxiety. Pain:  No focal pain.  Review of systems:  All other systems reviewed and found to be negative.  Physical Exam: Blood pressure (!) 162/82, pulse 94, temperature 97.2 F (36.2 C), temperature source Tympanic, resp. rate 18, weight 194 lb 14.2 oz (88.4 kg).   General: Well-developed, well-nourished, no acute distress. Eyes: Pink conjunctiva, anicteric sclera. Lungs: Clear to auscultation bilaterally. Heart: Regular rate and rhythm. No rubs, murmurs, or gallops. Abdomen: Soft, nontender, nondistended. No organomegaly noted, normoactive bowel sounds. Musculoskeletal: No edema, cyanosis, or clubbing. Neuro: Alert, answering all questions appropriately. Cranial nerves grossly intact. Skin: No rashes or petechiae noted. Psych: Normal affect.   Appointment on 03/09/2016  Component Date Value Ref Range Status  . WBC 03/09/2016 6.2  3.6 - 11.0 K/uL Final  . RBC 03/09/2016 4.52  3.80 - 5.20 MIL/uL Final  . Hemoglobin 03/09/2016 13.4  12.0 - 16.0 g/dL Final  . HCT 03/09/2016 39.2  35.0 - 47.0 % Final  . MCV 03/09/2016 86.7  80.0 - 100.0 fL Final  . MCH 03/09/2016 29.7  26.0 - 34.0 pg Final  . MCHC 03/09/2016 34.2  32.0 - 36.0 g/dL Final  . RDW 03/09/2016 13.1  11.5 - 14.5 % Final  . Platelets 03/09/2016 301  150 - 440 K/uL Final  . Neutrophils Relative % 03/09/2016 61  % Final  . Neutro Abs 03/09/2016 3.8  1.4 - 6.5 K/uL Final  . Lymphocytes Relative 03/09/2016 28  % Final  . Lymphs Abs 03/09/2016 1.7  1.0 - 3.6 K/uL Final  . Monocytes Relative 03/09/2016 8  % Final  . Monocytes Absolute 03/09/2016 0.5  0.2 - 0.9 K/uL Final  . Eosinophils Relative 03/09/2016 2  % Final  . Eosinophils Absolute 03/09/2016 0.1  0 - 0.7  K/uL Final  . Basophils Relative 03/09/2016 1  % Final  . Basophils Absolute 03/09/2016 0.1  0 - 0.1 K/uL Final  . Ferritin 03/09/2016 38  11 - 307 ng/mL Final    Assessment:  Valerie Duran is a 70 y.o. female with celiac disease and difficulty absorbing nutrients.  She has a history of progressive anemia.  EGD and colonoscopy in 2011 revealed no apparent abnormality.  Diet is modest.  She denies any melena or hematochezia.  Although patient's hemoglobin from today is within normal limits, her ferritin and iron stores have slightly trended down. She is also symptomatic. Patient will return to clinic tomorrow to receive 600 mg IV Venofer.  She last received IV iron in November 2016. Patient  also received 2 units packed red blood cells on December 14, 2014.  Return to clinic in 6 months with repeat laboratory work, further evaluation, and consideration of additional IV iron.   She fell on 08/05/2014 and fractured her hip.  She underwent right total hip arthroplasty on 01/14/2015.  Symptomatically, her energy level is up and down.  She is constantly on the "go".  Exam is stable.  Hematocrit is 37.3.  MCV is normal.  Ferritin is 44.  Plan: 1.  Labs today:  CBC with diff, CMP, ferritin, iron studies.   2.  Venofer tomorrow. 3.  RTC in 6 months for MD assessment, labs (CBC with diff, ferritin) and +/- Venofer next day.   Lloyd Huger, MD  03/09/2016, 11:51 AM

## 2016-03-09 ENCOUNTER — Inpatient Hospital Stay: Payer: Medicare Other

## 2016-03-09 ENCOUNTER — Inpatient Hospital Stay: Payer: Medicare Other | Attending: Hematology and Oncology | Admitting: Oncology

## 2016-03-09 VITALS — BP 162/82 | HR 94 | Temp 97.2°F | Resp 18 | Wt 194.9 lb

## 2016-03-09 DIAGNOSIS — Z8781 Personal history of (healed) traumatic fracture: Secondary | ICD-10-CM | POA: Insufficient documentation

## 2016-03-09 DIAGNOSIS — D509 Iron deficiency anemia, unspecified: Secondary | ICD-10-CM

## 2016-03-09 DIAGNOSIS — F329 Major depressive disorder, single episode, unspecified: Secondary | ICD-10-CM | POA: Diagnosis not present

## 2016-03-09 DIAGNOSIS — Z79899 Other long term (current) drug therapy: Secondary | ICD-10-CM | POA: Diagnosis not present

## 2016-03-09 DIAGNOSIS — R011 Cardiac murmur, unspecified: Secondary | ICD-10-CM | POA: Diagnosis not present

## 2016-03-09 DIAGNOSIS — M129 Arthropathy, unspecified: Secondary | ICD-10-CM | POA: Insufficient documentation

## 2016-03-09 DIAGNOSIS — K219 Gastro-esophageal reflux disease without esophagitis: Secondary | ICD-10-CM | POA: Diagnosis not present

## 2016-03-09 DIAGNOSIS — R5383 Other fatigue: Secondary | ICD-10-CM | POA: Insufficient documentation

## 2016-03-09 DIAGNOSIS — D649 Anemia, unspecified: Secondary | ICD-10-CM

## 2016-03-09 DIAGNOSIS — R531 Weakness: Secondary | ICD-10-CM | POA: Diagnosis not present

## 2016-03-09 DIAGNOSIS — K900 Celiac disease: Secondary | ICD-10-CM | POA: Diagnosis not present

## 2016-03-09 LAB — CBC WITH DIFFERENTIAL/PLATELET
Basophils Absolute: 0.1 10*3/uL (ref 0–0.1)
Basophils Relative: 1 %
Eosinophils Absolute: 0.1 10*3/uL (ref 0–0.7)
Eosinophils Relative: 2 %
HCT: 39.2 % (ref 35.0–47.0)
Hemoglobin: 13.4 g/dL (ref 12.0–16.0)
Lymphocytes Relative: 28 %
Lymphs Abs: 1.7 10*3/uL (ref 1.0–3.6)
MCH: 29.7 pg (ref 26.0–34.0)
MCHC: 34.2 g/dL (ref 32.0–36.0)
MCV: 86.7 fL (ref 80.0–100.0)
Monocytes Absolute: 0.5 10*3/uL (ref 0.2–0.9)
Monocytes Relative: 8 %
Neutro Abs: 3.8 10*3/uL (ref 1.4–6.5)
Neutrophils Relative %: 61 %
Platelets: 301 10*3/uL (ref 150–440)
RBC: 4.52 MIL/uL (ref 3.80–5.20)
RDW: 13.1 % (ref 11.5–14.5)
WBC: 6.2 10*3/uL (ref 3.6–11.0)

## 2016-03-09 LAB — FERRITIN: Ferritin: 38 ng/mL (ref 11–307)

## 2016-03-10 ENCOUNTER — Other Ambulatory Visit: Payer: Self-pay | Admitting: Hematology and Oncology

## 2016-03-10 ENCOUNTER — Inpatient Hospital Stay: Payer: Medicare Other

## 2016-03-10 ENCOUNTER — Telehealth: Payer: Self-pay | Admitting: *Deleted

## 2016-03-10 VITALS — BP 133/77 | HR 76 | Temp 97.5°F | Resp 18

## 2016-03-10 DIAGNOSIS — D649 Anemia, unspecified: Secondary | ICD-10-CM

## 2016-03-10 DIAGNOSIS — D509 Iron deficiency anemia, unspecified: Secondary | ICD-10-CM | POA: Diagnosis not present

## 2016-03-10 MED ORDER — SODIUM CHLORIDE 0.9 % IV SOLN
Freq: Once | INTRAVENOUS | Status: AC
  Administered 2016-03-10: 10:00:00 via INTRAVENOUS
  Filled 2016-03-10: qty 1000

## 2016-03-10 MED ORDER — IRON SUCROSE 20 MG/ML IV SOLN
200.0000 mg | Freq: Once | INTRAVENOUS | Status: AC
  Administered 2016-03-10: 200 mg via INTRAVENOUS
  Filled 2016-03-10: qty 10

## 2016-03-10 NOTE — Telephone Encounter (Signed)
Called patient and LVM that labs are normal.

## 2016-06-24 ENCOUNTER — Other Ambulatory Visit: Payer: Self-pay | Admitting: *Deleted

## 2016-06-24 DIAGNOSIS — D508 Other iron deficiency anemias: Secondary | ICD-10-CM

## 2016-06-25 ENCOUNTER — Inpatient Hospital Stay: Payer: Medicare Other | Attending: Hematology and Oncology | Admitting: Hematology and Oncology

## 2016-06-25 ENCOUNTER — Inpatient Hospital Stay: Payer: Medicare Other

## 2016-06-25 VITALS — BP 134/75 | HR 90 | Temp 97.2°F | Resp 18 | Wt 193.1 lb

## 2016-06-25 DIAGNOSIS — D509 Iron deficiency anemia, unspecified: Secondary | ICD-10-CM

## 2016-06-25 DIAGNOSIS — G4730 Sleep apnea, unspecified: Secondary | ICD-10-CM | POA: Diagnosis not present

## 2016-06-25 DIAGNOSIS — Z79899 Other long term (current) drug therapy: Secondary | ICD-10-CM | POA: Diagnosis not present

## 2016-06-25 DIAGNOSIS — R531 Weakness: Secondary | ICD-10-CM | POA: Diagnosis not present

## 2016-06-25 DIAGNOSIS — D508 Other iron deficiency anemias: Secondary | ICD-10-CM

## 2016-06-25 DIAGNOSIS — R5383 Other fatigue: Secondary | ICD-10-CM | POA: Diagnosis not present

## 2016-06-25 DIAGNOSIS — F329 Major depressive disorder, single episode, unspecified: Secondary | ICD-10-CM

## 2016-06-25 DIAGNOSIS — K219 Gastro-esophageal reflux disease without esophagitis: Secondary | ICD-10-CM | POA: Diagnosis not present

## 2016-06-25 DIAGNOSIS — K900 Celiac disease: Secondary | ICD-10-CM | POA: Diagnosis not present

## 2016-06-25 DIAGNOSIS — M129 Arthropathy, unspecified: Secondary | ICD-10-CM | POA: Insufficient documentation

## 2016-06-25 DIAGNOSIS — R011 Cardiac murmur, unspecified: Secondary | ICD-10-CM | POA: Diagnosis not present

## 2016-06-25 DIAGNOSIS — Z800 Family history of malignant neoplasm of digestive organs: Secondary | ICD-10-CM | POA: Diagnosis not present

## 2016-06-25 LAB — IRON AND TIBC
Iron: 52 ug/dL (ref 28–170)
Saturation Ratios: 16 % (ref 10.4–31.8)
TIBC: 322 ug/dL (ref 250–450)
UIBC: 270 ug/dL

## 2016-06-25 LAB — SEDIMENTATION RATE: Sed Rate: 31 mm/hr — ABNORMAL HIGH (ref 0–30)

## 2016-06-25 LAB — CBC WITH DIFFERENTIAL/PLATELET
Basophils Absolute: 0.1 10*3/uL (ref 0–0.1)
Basophils Relative: 1 %
Eosinophils Absolute: 0.2 10*3/uL (ref 0–0.7)
Eosinophils Relative: 3 %
HCT: 40.3 % (ref 35.0–47.0)
Hemoglobin: 14 g/dL (ref 12.0–16.0)
Lymphocytes Relative: 29 %
Lymphs Abs: 1.9 10*3/uL (ref 1.0–3.6)
MCH: 30.6 pg (ref 26.0–34.0)
MCHC: 34.8 g/dL (ref 32.0–36.0)
MCV: 87.9 fL (ref 80.0–100.0)
Monocytes Absolute: 0.5 10*3/uL (ref 0.2–0.9)
Monocytes Relative: 8 %
Neutro Abs: 3.9 10*3/uL (ref 1.4–6.5)
Neutrophils Relative %: 59 %
Platelets: 328 10*3/uL (ref 150–440)
RBC: 4.58 MIL/uL (ref 3.80–5.20)
RDW: 13 % (ref 11.5–14.5)
WBC: 6.5 10*3/uL (ref 3.6–11.0)

## 2016-06-25 LAB — FERRITIN: Ferritin: 38 ng/mL (ref 11–307)

## 2016-06-25 NOTE — Progress Notes (Addendum)
[place].  Date:  06/25/16   Chief Complaint: Valerie Duran is a 71 y.o. female with chronic anemia who is seen for sick call visit.  HPI:  The patient was last seen in the medical oncology clinic on 08/29/2015.  At that time, her energy level was up and down.  She was constantly on the "go".  Exam was stable.  Hematocrit was 37.3.  MCV was normal.  Ferritin was 44.  CBC on 11/28/2015 revealed a hematocrit of 40.2, hemoglobin 13.7, and MCV 97.6.  Ferritin was 44.    She saw Dr. Grayland Ormond on 03/10/2016.  She felt chronically weak and fatigued.  CBC revealed a hematocrit of 39.2, hemoglobin 13.4, and MCV 86.7.  Ferritin was 38.  She received Venofer 200 mg IV.  Symptomatically, she is been fatigued for 1 month. She does not feel well rested in the morning. She runs out of steam after a short period. She complains of being "really tired". He has been busy with things her church. At the end of the day (4-5 pm), she is wiped out. She can sleep sitting up. Sleep apnea testing 8-9 years ago was negative. She has celiac disease. She watches what she eats. She notes inflammation in her hands and swelling if she eats wheat. She wakes up 2-3 times/night.  She states that she has a lot on her mind.  She denies any melena or hematochezia.   Past Medical History:  Diagnosis Date  . Anemia   . Arthritis   . Celiac disease   . Depression   . GERD (gastroesophageal reflux disease)   . Heart murmur     Past Surgical History:  Procedure Laterality Date  . ABDOMINAL HYSTERECTOMY    . BACK SURGERY     spinal fusion  . CATARACT EXTRACTION W/ INTRAOCULAR LENS  IMPLANT, BILATERAL Bilateral   . [place]  . FOOT SURGERY Right 2007  . HARDWARE REMOVAL Right 01/14/2015   Procedure: HARDWARE REMOVAL/ REMOVAL OF 3 CANNULATED SCREWS FROM RIGHT HIP.;  Surgeon: Dereck Leep, MD;  Location: [facility] ORS;  Service: Orthopedics;  Laterality: Right;  . HIP FRACTURE SURGERY Right  2016  . JOINT REPLACEMENT Bilateral 2014   knees  . NECK SURGERY     fusion  . TOTAL HIP ARTHROPLASTY Right 01/14/2015   Procedure: TOTAL HIP ARTHROPLASTY;  Surgeon: Dereck Leep, MD;  Location: [facility] ORS;  Service: Orthopedics;  Laterality: Right;    Family History  Problem Relation Age of Onset  . Cancer Father 64    Colon Cancer    Social History:  reports that she has never smoked. She does not have any smokeless tobacco history on file. She reports that she does not drink alcohol or use drugs.  She is busy with church activities.  She lives in [place].  The patient is accompanied by her husband today.  Allergies:  Allergies  Allergen Reactions  . Ambien [Zolpidem Tartrate] Other (See Comments)    Altered mental status  . Barley Grass Other (See Comments)    Celiac disease  . Codeine Nausea Only  . Tape Other (See Comments)    Adhesive tape - tears skin  . Valium [Diazepam] Other (See Comments)    Sleep walks  . Ibuprofen Other (See Comments) and Rash    GI upset " makes my stomach hurt"  . Simvastatin Rash  . Wheat Bran Rash    Celiac disease    Current Medications:  Current Outpatient Prescriptions  Medication Sig Dispense Refill  . Calcium Carbonate-Vitamin D (CALCIUM 600 + D PO) Take 1 tablet by mouth 2 (two) times daily.     . DULoxetine (CYMBALTA) 60 MG capsule TAKE 1 CAPSULE BY MOUTH EVERY DAY    . HYDROcodone-acetaminophen (LORTAB) 10-500 MG per tablet Take 1 tablet by mouth every 4 (four) hours as needed (0.5-1 tab as needed for pain). For pain    . Magnesium 500 MG CAPS Take 1 capsule by mouth every morning.     . Menthol, Topical Analgesic, (ZIMS MAX-FREEZE EX) Apply 1 spray topically 4 (four) times daily as needed.    . pantoprazole (PROTONIX) 40 MG tablet qam    . pramipexole (MIRAPEX) 1 MG tablet Take 1 tablet by mouth 2 (two) times daily.      No current facility-administered medications for this visit.    Facility-Administered Medications Ordered in  Other Visits  Medication Dose Route Frequency Provider Last Rate Last Dose  . 0.9 %  sodium chloride infusion   Intravenous Once Lequita Asal, MD        Review of Systems:  GENERAL:  Fatigue.  No fevers, sweats or weight loss.  Weight up 4 pounds since last visit. PERFORMANCE STATUS (ECOG): 1. HEENT:  No visual changes, runny nose, sore throat, mouth sores or tenderness. Lungs:  Shortness of breath with exertion.  No cough.  No hemoptysis.  Sleep apnea testing - 8-9 years ago. Cardiac:  No chest pain, palpitations, orthopnea, or PND. GI:  No nausea, vomiting, diarrhea, constipation, melena or hematochezia. GU:  No urgency, frequency, dysuria, or hematuria. Musculoskeletal: Arthritis in joints.  No muscle tenderness. Extremities:  No pain or swelling. Skin:  No rashes or skin changes. Neuro:  No headache, numbness or weakness, balance or coordination issues. Endocrine:  No diabetes, thyroid issues, hot flashes or night sweats. Psych:  No mood changes, depression or anxiety.  Wakes up 2-3 times a night. Pain:  No focal pain. Review of systems:  All other systems reviewed and found to be negative.  Physical Exam: Blood pressure 134/75, pulse 90, temperature 97.2 F (36.2 C), temperature source Tympanic, resp. rate 18, weight 193 lb 2 oz (87.6 kg). GENERAL:  Chronically fatigued appearing woman sitting comfortably in the exam room in no acute distress. MENTAL STATUS:  Alert and oriented to person, place and time. HEAD:  Owens Shark styled hair pulled up.  Normocephalic, atraumatic, face symmetric, no Cushingoid features. EYES:  Blue eyes.  Pupils equal round and reactive to light and accomodation.  Amblyopia.  No conjunctivitis or scleral icterus. ENT:  Oropharynx clear without lesion.  Tongue normal. Mucous membranes moist.  RESPIRATORY:  Clear to auscultation without rales, wheezes or rhonchi. CARDIOVASCULAR:  Regular rate and rhythm without murmur, rub or gallop. ABDOMEN:  Soft,  non-tender, with active bowel sounds, and no hepatosplenomegaly.  No masses. SKIN:  No rashes, ulcers or lesions. EXTREMITIES: Chronic mild lower extremity changes.  No skin discoloration or tenderness.  No palpable cords. LYMPH NODES: No palpable cervical, supraclavicular, axillary or inguinal adenopathy  NEUROLOGICAL: Unremarkable. PSYCH:  Appropriate.    Appointment on 06/25/2016  Component Date Value Ref Range Status  . WBC 06/25/2016 6.5  3.6 - 11.0 K/uL Final  . RBC 06/25/2016 4.58  3.80 - 5.20 MIL/uL Final  . Hemoglobin 06/25/2016 14.0  12.0 - 16.0 g/dL Final  . HCT 06/25/2016 40.3  35.0 - 47.0 % Final  . MCV 06/25/2016 87.9  80.0 - 100.0 fL  Final  . MCH 06/25/2016 30.6  26.0 - 34.0 pg Final  . MCHC 06/25/2016 34.8  32.0 - 36.0 g/dL Final  . RDW 06/25/2016 13.0  11.5 - 14.5 % Final  . Platelets 06/25/2016 328  150 - 440 K/uL Final  . Neutrophils Relative % 06/25/2016 59  % Final  . Neutro Abs 06/25/2016 3.9  1.4 - 6.5 K/uL Final  . Lymphocytes Relative 06/25/2016 29  % Final  . Lymphs Abs 06/25/2016 1.9  1.0 - 3.6 K/uL Final  . Monocytes Relative 06/25/2016 8  % Final  . Monocytes Absolute 06/25/2016 0.5  0.2 - 0.9 K/uL Final  . Eosinophils Relative 06/25/2016 3  % Final  . Eosinophils Absolute 06/25/2016 0.2  0 - 0.7 K/uL Final  . Basophils Relative 06/25/2016 1  % Final  . Basophils Absolute 06/25/2016 0.1  0 - 0.1 K/uL Final    Assessment:  Valerie Duran is a 71 y.o. female with celiac disease and associated iron deficiency anemia.  EGD and colonoscopy in 2011 revealed no apparent abnormality.  Diet is modest.  She denies any melena or hematochezia.  Labs on 12/13/2014 revealed severe iron deficiency with a hematocrit of 27.9, hemoglobin 8.5, MCV 67.9, and ferritin of 8.  She received 2 units of PRBCs on 12/14/2014.    She received Venofer 600 mg (12/20/2014 - 01/03/2015), 600 mg (04/26/2015 - 05/10/2015), and 200 mg on 03/10/2016.    Ferritin has been followed: 96 on  05/10/2015, 88 on 05/23/2015, 44 on 08/29/2015, 38 on 03/09/2016, and 38 on 06/25/2016.  She underwent right total hip arthroplasty on 01/14/2015.  Symptomatically, she is very active and notes fatigue at the end of the day.  She wakes up 2-3x/night.  Exam is stable.  Hematocrit is 40.3.  MCV is normal.  Ferritin is 38.  Plan: 1.  Labs today:  CBC with diff, CMP, ferritin, iron studies.   2.  Reassurance based on today's labs.  RN to call patient with ferritin 3.  RTC as previously scheduled.   Lequita Asal, MD  06/25/2016, 3:54 PM

## 2016-06-25 NOTE — Progress Notes (Signed)
Patient here today for generalized fatigue.  States it has been ongoing for the past month.  She states she feels weak and walks with a cane today.

## 2016-06-26 ENCOUNTER — Telehealth: Payer: Self-pay | Admitting: *Deleted

## 2016-06-26 NOTE — Telephone Encounter (Signed)
-----   Message from Shirlean Kelly, RN sent at 06/25/2016  4:17 PM EST ----- Regarding: Lab results Call patient with ferritin results.

## 2016-06-26 NOTE — Telephone Encounter (Signed)
Called patient with ferritin results.

## 2016-08-23 ENCOUNTER — Encounter: Payer: Self-pay | Admitting: Hematology and Oncology

## 2016-09-01 ENCOUNTER — Other Ambulatory Visit: Payer: Self-pay | Admitting: *Deleted

## 2016-09-01 DIAGNOSIS — D508 Other iron deficiency anemias: Secondary | ICD-10-CM

## 2016-09-07 ENCOUNTER — Other Ambulatory Visit: Payer: Self-pay | Admitting: *Deleted

## 2016-09-07 ENCOUNTER — Inpatient Hospital Stay (HOSPITAL_BASED_OUTPATIENT_CLINIC_OR_DEPARTMENT_OTHER): Payer: Medicare Other | Admitting: Hematology and Oncology

## 2016-09-07 ENCOUNTER — Inpatient Hospital Stay: Payer: Medicare Other | Attending: Hematology and Oncology

## 2016-09-07 ENCOUNTER — Encounter: Payer: Self-pay | Admitting: Hematology and Oncology

## 2016-09-07 VITALS — BP 163/87 | HR 91 | Temp 96.7°F | Resp 18 | Ht 68.0 in | Wt 200.5 lb

## 2016-09-07 DIAGNOSIS — K900 Celiac disease: Secondary | ICD-10-CM | POA: Insufficient documentation

## 2016-09-07 DIAGNOSIS — D509 Iron deficiency anemia, unspecified: Secondary | ICD-10-CM

## 2016-09-07 DIAGNOSIS — D649 Anemia, unspecified: Secondary | ICD-10-CM | POA: Insufficient documentation

## 2016-09-07 DIAGNOSIS — Z79899 Other long term (current) drug therapy: Secondary | ICD-10-CM

## 2016-09-07 DIAGNOSIS — F329 Major depressive disorder, single episode, unspecified: Secondary | ICD-10-CM

## 2016-09-07 DIAGNOSIS — K219 Gastro-esophageal reflux disease without esophagitis: Secondary | ICD-10-CM | POA: Diagnosis not present

## 2016-09-07 DIAGNOSIS — R011 Cardiac murmur, unspecified: Secondary | ICD-10-CM | POA: Insufficient documentation

## 2016-09-07 DIAGNOSIS — Z800 Family history of malignant neoplasm of digestive organs: Secondary | ICD-10-CM | POA: Diagnosis not present

## 2016-09-07 DIAGNOSIS — D367 Benign neoplasm of other specified sites: Secondary | ICD-10-CM | POA: Diagnosis not present

## 2016-09-07 DIAGNOSIS — D508 Other iron deficiency anemias: Secondary | ICD-10-CM

## 2016-09-07 DIAGNOSIS — M129 Arthropathy, unspecified: Secondary | ICD-10-CM

## 2016-09-07 DIAGNOSIS — R5382 Chronic fatigue, unspecified: Secondary | ICD-10-CM

## 2016-09-07 DIAGNOSIS — R5383 Other fatigue: Secondary | ICD-10-CM | POA: Insufficient documentation

## 2016-09-07 DIAGNOSIS — M545 Low back pain: Secondary | ICD-10-CM | POA: Diagnosis not present

## 2016-09-07 DIAGNOSIS — R531 Weakness: Secondary | ICD-10-CM

## 2016-09-07 LAB — CBC WITH DIFFERENTIAL/PLATELET
Basophils Absolute: 0.1 10*3/uL (ref 0–0.1)
Basophils Relative: 2 %
Eosinophils Absolute: 0.2 10*3/uL (ref 0–0.7)
Eosinophils Relative: 3 %
HCT: 39 % (ref 35.0–47.0)
Hemoglobin: 13.6 g/dL (ref 12.0–16.0)
Lymphocytes Relative: 29 %
Lymphs Abs: 1.6 10*3/uL (ref 1.0–3.6)
MCH: 30.7 pg (ref 26.0–34.0)
MCHC: 35 g/dL (ref 32.0–36.0)
MCV: 87.7 fL (ref 80.0–100.0)
Monocytes Absolute: 0.5 10*3/uL (ref 0.2–0.9)
Monocytes Relative: 9 %
Neutro Abs: 3.1 10*3/uL (ref 1.4–6.5)
Neutrophils Relative %: 57 %
Platelets: 327 10*3/uL (ref 150–440)
RBC: 4.44 MIL/uL (ref 3.80–5.20)
RDW: 12.8 % (ref 11.5–14.5)
WBC: 5.4 10*3/uL (ref 3.6–11.0)

## 2016-09-07 LAB — FERRITIN: Ferritin: 33 ng/mL (ref 11–307)

## 2016-09-07 LAB — TSH: TSH: 7.524 u[IU]/mL — AB (ref 0.350–4.500)

## 2016-09-07 NOTE — Progress Notes (Signed)
[place].  Date:  09/07/16   Chief Complaint: Valerie Duran is a 71 y.o. female with chronic anemia who is seen for 2 month assessment.  HPI:  The patient was last seen in the medical oncology clinic on 06/25/2016.  At that time, she was seen for a sick call visit.  She had increased fatigued for 1 month.  Labs included a hematocrit 40.3, hemoglobin 14.0, and MCV 87.9.  Ferritin was 38.  She saw Dr. Grayland Ormond on 03/10/2016.  She felt chronically weak and fatigued.  CBC revealed a hematocrit of 39.2, hemoglobin 13.4, and MCV 86.7.  Ferritin was 38.  She received Venofer 200 mg IV.  Symptomatically, she continues to be very active and fatigued.  She reports being on a gluten-free diet for her celiac disease.  She denies any bleeding, bruising, hematuria, melena or hematochezia.  She reports decreased fine motor skills due to numbness in her right thumb, index, and second fingers. She reports an enlarging mole on her left shin for which she is following up with her PCP.   Past Medical History:  Diagnosis Date  . Anemia   . Arthritis   . Back pain   . Celiac disease   . Depression   . GERD (gastroesophageal reflux disease)   . Heart murmur     Past Surgical History:  Procedure Laterality Date  . ABDOMINAL HYSTERECTOMY    . BACK SURGERY     spinal fusion  . CATARACT EXTRACTION W/ INTRAOCULAR LENS  IMPLANT, BILATERAL Bilateral   . [place]  . FOOT SURGERY Right 2007  . HARDWARE REMOVAL Right 01/14/2015   Procedure: HARDWARE REMOVAL/ REMOVAL OF 3 CANNULATED SCREWS FROM RIGHT HIP.;  Surgeon: Dereck Leep, MD;  Location: [facility];  Service: Orthopedics;  Laterality: Right;  . HIP FRACTURE SURGERY Right 2016  . JOINT REPLACEMENT Bilateral 2014   knees  . NECK SURGERY     fusion  . TOTAL HIP ARTHROPLASTY Right 01/14/2015   Procedure: TOTAL HIP ARTHROPLASTY;  Surgeon: Dereck Leep, MD;  Location: [facility];  Service: Orthopedics;  Laterality:  Right;    Family History  Problem Relation Age of Onset  . Cancer Father 51    Colon Cancer    Social History:  reports that she has never smoked. She has never used smokeless tobacco. She reports that she does not drink alcohol or use drugs.  She is busy with church activities.  She lives in [place].  She states that she works from [employer] to 11 pm- 12 AM.  She works 7 days/week.  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  The patient is alone today.  Allergies:  Allergies  Allergen Reactions  . Ambien [Zolpidem Tartrate] Other (See Comments)    Altered mental status  . Barley Grass Other (See Comments)    Celiac disease  . Codeine Nausea Only  . Tape Other (See Comments)    Adhesive tape - tears skin  . Valium [Diazepam] Other (See Comments)    Sleep walks  . Ibuprofen Other (See Comments) and Rash    GI upset " makes my stomach hurt"  . Simvastatin Rash  . Wheat Bran Rash    Celiac disease    Current Medications: Current Outpatient Prescriptions  Medication Sig Dispense Refill  . acetaminophen (TYLENOL) 500 MG tablet Take 500 mg by mouth every 6 (six) hours as needed.    . Calcium Carbonate-Vitamin D (CALCIUM 600 + D PO) Take  1 tablet by mouth 2 (two) times daily.     . DULoxetine (CYMBALTA) 60 MG capsule TAKE 1 CAPSULE BY MOUTH EVERY DAY    . HYDROcodone-acetaminophen (LORTAB) 10-500 MG per tablet Take 1 tablet by mouth every 4 (four) hours as needed (0.5-1 tab as needed for pain). For pain    . Magnesium 500 MG CAPS Take 1 capsule by mouth every morning.     . Menthol, Topical Analgesic, (ZIMS MAX-FREEZE EX) Apply 1 spray topically 4 (four) times daily as needed.    . pantoprazole (PROTONIX) 40 MG tablet qam    . pramipexole (MIRAPEX) 1 MG tablet Take 1 tablet by mouth 2 (two) times daily.      No current facility-administered medications for this visit.    Facility-Administered Medications Ordered in Other Visits  Medication Dose Route Frequency Provider Last Rate Last Dose  . 0.9 %   sodium chloride infusion   Intravenous Once Lequita Asal, MD        Review of Systems:  GENERAL:  Fatigue.  Works 7 days/week.  No fevers, sweats or weight loss.  Weight up 7 pounds since last visit. PERFORMANCE STATUS (ECOG): 1. HEENT:  No visual changes, runny nose, sore throat, mouth sores or tenderness. Lungs:  Shortness of breath with exertion.  No cough.  No hemoptysis.  Sleep apnea testing - 8-9 years ago. Cardiac:  No chest pain, palpitations, orthopnea, or PND. GI:  No nausea, vomiting, diarrhea, constipation, melena or hematochezia. GU:  No urgency, frequency, dysuria, or hematuria. Musculoskeletal: Arthritis in joints.  No muscle tenderness. Extremities:  No pain or swelling. Skin:  No rashes.  Enlarging mole on left shin. Neuro:  No headache, numbness or weakness, balance or coordination issues. Endocrine:  No diabetes, thyroid issues, hot flashes or night sweats. Psych:  No mood changes, depression or anxiety.  Wakes up 2-3 times a night. Pain:  No focal pain. Review of systems:  All other systems reviewed and found to be negative.  Physical Exam: Blood pressure (!) 163/87, pulse 91, temperature (!) 96.7 F (35.9 C), resp. rate 18, height 5' 8"  (1.727 m), weight 200 lb 8 oz (90.9 kg). GENERAL:  Chronically fatigued appearing woman sitting comfortably in the exam room in no acute distress. MENTAL STATUS:  Alert and oriented to person, place and time. HEAD:  Owens Shark styled hair pulled up.  Normocephalic, atraumatic, face symmetric, no Cushingoid features. EYES:  Blue eyes.  Pupils equal round and reactive to light and accomodation.  Amblyopia.  No conjunctivitis or scleral icterus. ENT:  Oropharynx clear without lesion.  Tongue normal. Mucous membranes moist.  RESPIRATORY:  Clear to auscultation without rales, wheezes or rhonchi. CARDIOVASCULAR:  Regular rate and rhythm without murmur, rub or gallop. ABDOMEN:  Soft, non-tender, with active bowel sounds, and no  hepatosplenomegaly.  No masses. SKIN:  No rashes or ulcers.  ~1cm Teal Bontrager raised lesion on left shin.  EXTREMITIES: Chronic mild lower extremity changes.  No skin discoloration or tenderness.  No palpable cords. LYMPH NODES: No palpable cervical, supraclavicular, axillary or inguinal adenopathy  NEUROLOGICAL: Unremarkable. PSYCH:  Appropriate.    Appointment on 09/07/2016  Component Date Value Ref Range Status  . WBC 09/07/2016 5.4  3.6 - 11.0 K/uL Final  . RBC 09/07/2016 4.44  3.80 - 5.20 MIL/uL Final  . Hemoglobin 09/07/2016 13.6  12.0 - 16.0 g/dL Final  . HCT 09/07/2016 39.0  35.0 - 47.0 % Final  . MCV 09/07/2016 87.7  80.0 - 100.0 fL  Final  . MCH 09/07/2016 30.7  26.0 - 34.0 pg Final  . MCHC 09/07/2016 35.0  32.0 - 36.0 g/dL Final  . RDW 09/07/2016 12.8  11.5 - 14.5 % Final  . Platelets 09/07/2016 327  150 - 440 K/uL Final  . Neutrophils Relative % 09/07/2016 57  % Final  . Neutro Abs 09/07/2016 3.1  1.4 - 6.5 K/uL Final  . Lymphocytes Relative 09/07/2016 29  % Final  . Lymphs Abs 09/07/2016 1.6  1.0 - 3.6 K/uL Final  . Monocytes Relative 09/07/2016 9  % Final  . Monocytes Absolute 09/07/2016 0.5  0.2 - 0.9 K/uL Final  . Eosinophils Relative 09/07/2016 3  % Final  . Eosinophils Absolute 09/07/2016 0.2  0 - 0.7 K/uL Final  . Basophils Relative 09/07/2016 2  % Final  . Basophils Absolute 09/07/2016 0.1  0 - 0.1 K/uL Final  . Ferritin 09/07/2016 33  11 - 307 ng/mL Final    Assessment:  Valerie Duran is a 71 y.o. female with celiac disease and associated iron deficiency anemia.  EGD and colonoscopy in 2011 revealed no apparent abnormality.  Diet is modest, gluten-free.  She denies any melena or hematochezia.  Labs on 12/13/2014 revealed severe iron deficiency with a hematocrit of 27.9, hemoglobin 8.5, MCV 67.9, and ferritin of 8.  She received 2 units of PRBCs on 12/14/2014.    She received Venofer 600 mg (12/20/2014 - 01/03/2015), 600 mg (04/26/2015 - 05/10/2015), and 200 mg on  03/10/2016.    Ferritin has been followed: 96 on 05/10/2015, 88 on 05/23/2015, 44 on 08/29/2015, 38 on 03/09/2016, 38 on 06/25/2016, and 33 on 09/07/2016.  She underwent right total hip arthroplasty on 01/14/2015.  Symptomatically, she works long hours every day and is fatigued.  She wakes up 2-3x/night.  Exam is stable.  Hematocrit is 39.0.  MCV is normal.  Ferritin is 33.  Plan: 1.  Labs today:  CBC with diff, ferritin, TSH. 2.  Reassurance based on today's labs.  No Venofer tomorrow. 3.  Follow-up with Dr. Baldemar Lenis re: screening mammogram. 4.  Follow-up with Dr. Baldemar Lenis re: left tibial skin lesion. 5.  RTC in 3 months: labs (CBC with diff, ferritin). 6.  RTC in 6 months for MD assessment, labs (CBC with diff, ferritin-day before) +/- Venofer.   Lucendia Herrlich, NP   I saw and evaluated the patient, participating in the key portions of the service and reviewing pertinent diagnostic studies and records.  I reviewed the nurse practitioner's note and agree with the findings and the plan.  The assessment and plan were discussed with the patient.  Multiple questions were asked by the patient and answered.    Lequita Asal, MD  09/07/2016, 11:43 AM

## 2016-09-08 ENCOUNTER — Inpatient Hospital Stay: Payer: Medicare Other

## 2016-11-02 ENCOUNTER — Other Ambulatory Visit: Payer: Self-pay | Admitting: Family Medicine

## 2016-11-02 DIAGNOSIS — Z1231 Encounter for screening mammogram for malignant neoplasm of breast: Secondary | ICD-10-CM

## 2016-11-20 ENCOUNTER — Ambulatory Visit
Admission: RE | Admit: 2016-11-20 | Discharge: 2016-11-20 | Disposition: A | Discharge status: Home / Self Care | Payer: Medicare Other | Source: Ambulatory Visit | Attending: Family Medicine | Admitting: Family Medicine

## 2016-11-20 DIAGNOSIS — Z1231 Encounter for screening mammogram for malignant neoplasm of breast: Secondary | ICD-10-CM | POA: Insufficient documentation

## 2016-12-08 ENCOUNTER — Inpatient Hospital Stay: Payer: Medicare Other

## 2016-12-10 ENCOUNTER — Inpatient Hospital Stay: Payer: Medicare Other | Attending: Hematology and Oncology

## 2016-12-10 DIAGNOSIS — D508 Other iron deficiency anemias: Secondary | ICD-10-CM

## 2016-12-10 DIAGNOSIS — D509 Iron deficiency anemia, unspecified: Secondary | ICD-10-CM | POA: Insufficient documentation

## 2016-12-10 LAB — CBC WITH DIFFERENTIAL/PLATELET
Basophils Absolute: 0.1 10*3/uL (ref 0–0.1)
Basophils Relative: 1 %
Eosinophils Absolute: 0.1 10*3/uL (ref 0–0.7)
Eosinophils Relative: 2 %
HCT: 37.9 % (ref 35.0–47.0)
Hemoglobin: 13.4 g/dL (ref 12.0–16.0)
Lymphocytes Relative: 34 %
Lymphs Abs: 2 10*3/uL (ref 1.0–3.6)
MCH: 30.4 pg (ref 26.0–34.0)
MCHC: 35.3 g/dL (ref 32.0–36.0)
MCV: 86.2 fL (ref 80.0–100.0)
Monocytes Absolute: 0.4 10*3/uL (ref 0.2–0.9)
Monocytes Relative: 7 %
Neutro Abs: 3.3 10*3/uL (ref 1.4–6.5)
Neutrophils Relative %: 56 %
Platelets: 329 10*3/uL (ref 150–440)
RBC: 4.39 MIL/uL (ref 3.80–5.20)
RDW: 12.5 % (ref 11.5–14.5)
WBC: 6 10*3/uL (ref 3.6–11.0)

## 2016-12-10 LAB — COMPREHENSIVE METABOLIC PANEL
ALK PHOS: 83 U/L (ref 38–126)
ALT: 16 U/L (ref 14–54)
AST: 25 U/L (ref 15–41)
Albumin: 3.6 g/dL (ref 3.5–5.0)
Anion gap: 7 (ref 5–15)
BUN: 12 mg/dL (ref 6–20)
CALCIUM: 9.1 mg/dL (ref 8.9–10.3)
CO2: 26 mmol/L (ref 22–32)
CREATININE: 0.71 mg/dL (ref 0.44–1.00)
Chloride: 104 mmol/L (ref 101–111)
Glucose, Bld: 164 mg/dL — ABNORMAL HIGH (ref 65–99)
Potassium: 3.8 mmol/L (ref 3.5–5.1)
SODIUM: 137 mmol/L (ref 135–145)
Total Bilirubin: 0.4 mg/dL (ref 0.3–1.2)
Total Protein: 7.6 g/dL (ref 6.5–8.1)

## 2016-12-10 LAB — FERRITIN: Ferritin: 37 ng/mL (ref 11–307)

## 2016-12-15 ENCOUNTER — Telehealth: Payer: Self-pay | Admitting: *Deleted

## 2016-12-15 NOTE — Telephone Encounter (Signed)
Asking for a call back to get lab results from last week. Please return her call

## 2016-12-15 NOTE — Telephone Encounter (Signed)
Called patient and gave her lab results.

## 2017-03-11 ENCOUNTER — Inpatient Hospital Stay: Payer: Medicare Other | Admitting: Hematology and Oncology

## 2017-03-11 ENCOUNTER — Inpatient Hospital Stay (HOSPITAL_BASED_OUTPATIENT_CLINIC_OR_DEPARTMENT_OTHER): Payer: Medicare Other | Admitting: Hematology and Oncology

## 2017-03-11 ENCOUNTER — Encounter: Payer: Self-pay | Admitting: Hematology and Oncology

## 2017-03-11 ENCOUNTER — Inpatient Hospital Stay: Payer: Medicare Other

## 2017-03-11 ENCOUNTER — Other Ambulatory Visit: Payer: Self-pay | Admitting: *Deleted

## 2017-03-11 ENCOUNTER — Inpatient Hospital Stay: Payer: Medicare Other | Attending: Hematology and Oncology

## 2017-03-11 VITALS — BP 130/87 | HR 97 | Temp 98.1°F | Resp 16 | Wt 206.1 lb

## 2017-03-11 DIAGNOSIS — K900 Celiac disease: Secondary | ICD-10-CM | POA: Insufficient documentation

## 2017-03-11 DIAGNOSIS — F329 Major depressive disorder, single episode, unspecified: Secondary | ICD-10-CM | POA: Diagnosis not present

## 2017-03-11 DIAGNOSIS — D509 Iron deficiency anemia, unspecified: Secondary | ICD-10-CM

## 2017-03-11 DIAGNOSIS — R5383 Other fatigue: Secondary | ICD-10-CM | POA: Insufficient documentation

## 2017-03-11 DIAGNOSIS — K219 Gastro-esophageal reflux disease without esophagitis: Secondary | ICD-10-CM | POA: Diagnosis not present

## 2017-03-11 DIAGNOSIS — D508 Other iron deficiency anemias: Secondary | ICD-10-CM

## 2017-03-11 DIAGNOSIS — Z800 Family history of malignant neoplasm of digestive organs: Secondary | ICD-10-CM

## 2017-03-11 DIAGNOSIS — Z79899 Other long term (current) drug therapy: Secondary | ICD-10-CM | POA: Insufficient documentation

## 2017-03-11 DIAGNOSIS — M549 Dorsalgia, unspecified: Secondary | ICD-10-CM | POA: Insufficient documentation

## 2017-03-11 DIAGNOSIS — Z803 Family history of malignant neoplasm of breast: Secondary | ICD-10-CM | POA: Insufficient documentation

## 2017-03-11 LAB — IRON AND TIBC
Iron: 65 ug/dL (ref 28–170)
Saturation Ratios: 20 % (ref 10.4–31.8)
TIBC: 320 ug/dL (ref 250–450)
UIBC: 255 ug/dL

## 2017-03-11 LAB — CBC WITH DIFFERENTIAL/PLATELET
Basophils Absolute: 0.1 10*3/uL (ref 0–0.1)
Basophils Relative: 1 %
Eosinophils Absolute: 0.1 10*3/uL (ref 0–0.7)
Eosinophils Relative: 2 %
HCT: 39.1 % (ref 35.0–47.0)
Hemoglobin: 13.6 g/dL (ref 12.0–16.0)
Lymphocytes Relative: 27 %
Lymphs Abs: 2 10*3/uL (ref 1.0–3.6)
MCH: 30.3 pg (ref 26.0–34.0)
MCHC: 34.8 g/dL (ref 32.0–36.0)
MCV: 86.9 fL (ref 80.0–100.0)
Monocytes Absolute: 0.6 10*3/uL (ref 0.2–0.9)
Monocytes Relative: 8 %
Neutro Abs: 4.8 10*3/uL (ref 1.4–6.5)
Neutrophils Relative %: 62 %
Platelets: 331 10*3/uL (ref 150–440)
RBC: 4.5 MIL/uL (ref 3.80–5.20)
RDW: 12.8 % (ref 11.5–14.5)
WBC: 7.6 10*3/uL (ref 3.6–11.0)

## 2017-03-11 LAB — FERRITIN: Ferritin: 26 ng/mL (ref 11–307)

## 2017-03-11 NOTE — Progress Notes (Signed)
[place].  Date:  03/11/17   Chief Complaint: Valerie Duran is a 71 y.o. female with celiac disease and chronic iron deficiency anemia who is seen for 6 month assessment.  HPI:  The patient was last seen in the medical oncology clinic on 09/07/2016.  At that time, she was working long hours every day and was fatigued.  She woke up 2-3x/night.  Exam was stable.  Hematocrit was 39.0.  MCV was normal.  Ferritin was 33.  Symptomatically, she is "doing better".  Patient continues to work long hours. She is sleeping better with the use of essential oils. She has no physical complaints today.  She is following celiac diet; if she deviates from her diet, she "swells".  She denies any weight loss. Patient states, "I am becoming diabetic". Hemoglobin A1c 7.3 per her report. She is making dietary changes rather than going on medication at this time.    Past Medical History:  Diagnosis Date  . Anemia   . Arthritis   . Back pain   . Celiac disease   . Depression   . GERD (gastroesophageal reflux disease)   . Heart murmur     Past Surgical History:  Procedure Laterality Date  . ABDOMINAL HYSTERECTOMY    . BACK SURGERY     spinal fusion  . CATARACT EXTRACTION W/ INTRAOCULAR LENS  IMPLANT, BILATERAL Bilateral   . [place]  . FOOT SURGERY Right 2007  . HARDWARE REMOVAL Right 01/14/2015   Procedure: HARDWARE REMOVAL/ REMOVAL OF 3 CANNULATED SCREWS FROM RIGHT HIP.;  Surgeon: Dereck Leep, MD;  Location: [facility];  Service: Orthopedics;  Laterality: Right;  . HIP FRACTURE SURGERY Right 2016  . JOINT REPLACEMENT Bilateral 2014   knees  . NECK SURGERY     fusion  . TOTAL HIP ARTHROPLASTY Right 01/14/2015   Procedure: TOTAL HIP ARTHROPLASTY;  Surgeon: Dereck Leep, MD;  Location: [facility];  Service: Orthopedics;  Laterality: Right;    Family History  Problem Relation Age of Onset  . Cancer Father 102       Colon Cancer  . Breast cancer Father 62     Social History:  reports that she has never smoked. She has never used smokeless tobacco. She reports that she does not drink alcohol or use drugs.  She is busy with church activities.  She lives in [place].    Allergies:  Allergies  Allergen Reactions  . Ambien [Zolpidem Tartrate] Other (See Comments)    Altered mental status  . Barley Grass Other (See Comments)    Celiac disease  . Codeine Nausea Only  . Tape Other (See Comments)    Adhesive tape - tears skin  . Valium [Diazepam] Other (See Comments)    Sleep walks  . Ibuprofen Other (See Comments) and Rash    GI upset " makes my stomach hurt"  . Simvastatin Rash  . Wheat Bran Rash    Celiac disease    Current Medications: Current Outpatient Prescriptions  Medication Sig Dispense Refill  . DULoxetine (CYMBALTA) 60 MG capsule TAKE 1 CAPSULE BY MOUTH EVERY DAY    . HYDROcodone-acetaminophen (LORTAB) 10-500 MG per tablet Take 1 tablet by mouth every 4 (four) hours as needed (0.5-1 tab as needed for pain). For pain    . Magnesium 500 MG CAPS Take 1 capsule by mouth every morning.     . pantoprazole (PROTONIX) 40 MG tablet qam    . pramipexole (MIRAPEX)  1 MG tablet Take 1 tablet by mouth 2 (two) times daily.     Marland Kitchen acetaminophen (TYLENOL) 500 MG tablet Take 500 mg by mouth every 6 (six) hours as needed.    . Calcium Carbonate-Vitamin D (CALCIUM 600 + D PO) Take 1 tablet by mouth 2 (two) times daily.     . Menthol, Topical Analgesic, (ZIMS MAX-FREEZE EX) Apply 1 spray topically 4 (four) times daily as needed.     No current facility-administered medications for this visit.    Facility-Administered Medications Ordered in Other Visits  Medication Dose Route Frequency Provider Last Rate Last Dose  . 0.9 %  sodium chloride infusion   Intravenous Once Lequita Asal, MD        Review of Systems:  GENERAL:  Doing "better".  No fevers, sweats or weight loss.  Weight up 6 pounds since last visit. PERFORMANCE STATUS (ECOG):  1. HEENT:  No visual changes, runny nose, sore throat, mouth sores or tenderness. Lungs:  Shortness of breath with exertion.  No cough.  No hemoptysis.  Sleep apnea testing - 8-9 years ago. Cardiac:  No chest pain, palpitations, orthopnea, or PND. GI:  No nausea, vomiting, diarrhea, constipation, melena or hematochezia. GU:  No urgency, frequency, dysuria, or hematuria. Musculoskeletal: Arthritis in joints.  No muscle tenderness. Extremities:  No pain or swelling. Skin:  No rashes or skin changes. Neuro:  No headache, numbness or weakness, balance or coordination issues. Endocrine:  Hgb A1C 7.3 per patient.  No diabetes, thyroid issues, hot flashes or night sweats. Psych:  No mood changes, depression or anxiety.  Sleep better with essential oils. Pain:  No focal pain. Review of systems:  All other systems reviewed and found to be negative.  Physical Exam: Blood pressure 130/87, pulse 97, temperature 98.1 F (36.7 C), temperature source Tympanic, resp. rate 16, weight 206 lb 1.6 oz (93.5 kg). GENERAL:  Well developed, well nourished woman sitting comfortably in the exam room in no acute distress. MENTAL STATUS:  Alert and oriented to person, place and time. HEAD:  Owens Shark styled hair.  Normocephalic, atraumatic, face symmetric, no Cushingoid features. EYES:  Blue eyes.  Pupils equal round and reactive to light and accomodation.  Amblyopia.  No conjunctivitis or scleral icterus. ENT:  Oropharynx clear without lesion.  Tongue normal. Mucous membranes moist.  RESPIRATORY:  Clear to auscultation without rales, wheezes or rhonchi. CARDIOVASCULAR:  Regular rate and rhythm without murmur, rub or gallop. ABDOMEN:  Soft, non-tender, with active bowel sounds, and no hepatosplenomegaly.  No masses. SKIN:  No rashes, ulcers or lesions. EXTREMITIES: Chronic mild lower extremity changes.  No skin discoloration or tenderness.  No palpable cords. LYMPH NODES: No palpable cervical, supraclavicular, axillary  or inguinal adenopathy  NEUROLOGICAL: Unremarkable. PSYCH:  Appropriate.    Appointment on 03/11/2017  Component Date Value Ref Range Status  . WBC 03/11/2017 7.6  3.6 - 11.0 K/uL Final  . RBC 03/11/2017 4.50  3.80 - 5.20 MIL/uL Final  . Hemoglobin 03/11/2017 13.6  12.0 - 16.0 g/dL Final  . HCT 03/11/2017 39.1  35.0 - 47.0 % Final  . MCV 03/11/2017 86.9  80.0 - 100.0 fL Final  . MCH 03/11/2017 30.3  26.0 - 34.0 pg Final  . MCHC 03/11/2017 34.8  32.0 - 36.0 g/dL Final  . RDW 03/11/2017 12.8  11.5 - 14.5 % Final  . Platelets 03/11/2017 331  150 - 440 K/uL Final  . Neutrophils Relative % 03/11/2017 62  % Final  . Neutro  Abs 03/11/2017 4.8  1.4 - 6.5 K/uL Final  . Lymphocytes Relative 03/11/2017 27  % Final  . Lymphs Abs 03/11/2017 2.0  1.0 - 3.6 K/uL Final  . Monocytes Relative 03/11/2017 8  % Final  . Monocytes Absolute 03/11/2017 0.6  0.2 - 0.9 K/uL Final  . Eosinophils Relative 03/11/2017 2  % Final  . Eosinophils Absolute 03/11/2017 0.1  0 - 0.7 K/uL Final  . Basophils Relative 03/11/2017 1  % Final  . Basophils Absolute 03/11/2017 0.1  0 - 0.1 K/uL Final    Assessment:  CECILY LAWHORNE is a 71 y.o. female with celiac disease and associated iron deficiency anemia.  EGD and colonoscopy in 2011 revealed no apparent abnormality.  Diet is modest.  She denies any melena or hematochezia.  Labs on 12/13/2014 revealed severe iron deficiency with a hematocrit of 27.9, hemoglobin 8.5, MCV 67.9, and ferritin of 8.  She received 2 units of PRBCs on 12/14/2014.    She received Venofer 600 mg (12/20/2014 - 01/03/2015), 600 mg (04/26/2015 - 05/10/2015), and 200 mg on 03/10/2016.    Ferritin has been followed: 96 on 05/10/2015, 88 on 05/23/2015, 44 on 08/29/2015, 38 on 03/09/2016, 38 on 06/25/2016, 33 on 09/07/2016, 37 on 12/10/2016, and 26 on 03/11/2017.  She underwent right total hip arthroplasty on 01/14/2015.  Symptomatically, she is feeling " better".  She continues to work long hours. She  is sleeping better.  Exam is stable.  Hematocrit is 39.1  MCV 86.9.  Ferritin is 26.  Plan: 1.  Labs today:  CBC with diff, ferritin, iron studies.  2.  RTC in 3 months for labs (CBC with diff, ferritin) 3.  RTC in 6 months for MD assessment and labs (CBC with diff, ferritin, iron studies).    Honor Loh, NP  03/11/2017, 1:56 PM   I saw and evaluated the patient, participating in the key portions of the service and reviewing pertinent diagnostic studies and records.  I reviewed the nurse practitioner's note and agree with the findings and the plan.  The assessment and plan were discussed with the patient.  A few questions were asked by the patient and answered.   Lequita Asal, MD 03/11/2017,7:06 PM

## 2017-03-11 NOTE — Progress Notes (Signed)
Patient is here for a follow up today. Patient states no new concerns today.

## 2017-06-10 ENCOUNTER — Inpatient Hospital Stay: Payer: Medicare Other | Attending: Hematology and Oncology

## 2017-06-10 DIAGNOSIS — D508 Other iron deficiency anemias: Secondary | ICD-10-CM

## 2017-06-10 DIAGNOSIS — D509 Iron deficiency anemia, unspecified: Secondary | ICD-10-CM | POA: Diagnosis present

## 2017-06-10 LAB — CBC WITH DIFFERENTIAL/PLATELET
Basophils Absolute: 0.1 10*3/uL (ref 0–0.1)
Basophils Relative: 1 %
Eosinophils Absolute: 0.1 10*3/uL (ref 0–0.7)
Eosinophils Relative: 2 %
HCT: 38.7 % (ref 35.0–47.0)
Hemoglobin: 12.9 g/dL (ref 12.0–16.0)
Lymphocytes Relative: 35 %
Lymphs Abs: 2 10*3/uL (ref 1.0–3.6)
MCH: 29.6 pg (ref 26.0–34.0)
MCHC: 33.3 g/dL (ref 32.0–36.0)
MCV: 88.9 fL (ref 80.0–100.0)
Monocytes Absolute: 0.5 10*3/uL (ref 0.2–0.9)
Monocytes Relative: 10 %
Neutro Abs: 2.9 10*3/uL (ref 1.4–6.5)
Neutrophils Relative %: 52 %
Platelets: 324 10*3/uL (ref 150–440)
RBC: 4.35 MIL/uL (ref 3.80–5.20)
RDW: 13 % (ref 11.5–14.5)
WBC: 5.7 10*3/uL (ref 3.6–11.0)

## 2017-06-10 LAB — FERRITIN: Ferritin: 24 ng/mL (ref 11–307)

## 2017-06-18 ENCOUNTER — Telehealth: Payer: Self-pay | Admitting: *Deleted

## 2017-06-18 NOTE — Telephone Encounter (Signed)
Schedule patient for weekly Venofer times 2 per Dr C. Left message on patient voice mail regarding this and message sent to schedulers to schedule patient appts

## 2017-06-18 NOTE — Telephone Encounter (Signed)
Patient asking for results from last week. No follow up appointment until March. Please advise if she needs to come in early.    Ref Range & Units 8d ago  Ferritin 11 - 307 ng/mL 24   Resulting Agency  [facility]      Specimen Collected: 06/10/17 14:53       Dx:  Other iron deficiency anemia   Ref Range & Units 8d ago  WBC 3.6 - 11.0 K/uL 5.7   RBC 3.80 - 5.20 MIL/uL 4.35   Hemoglobin 12.0 - 16.0 g/dL 12.9   HCT 35.0 - 47.0 % 38.7   MCV 80.0 - 100.0 fL 88.9   MCH 26.0 - 34.0 pg 29.6   MCHC 32.0 - 36.0 g/dL 33.3   RDW 11.5 - 14.5 % 13.0   Platelets 150 - 440 K/uL 324   Neutrophils Relative % % 52   Neutro Abs 1.4 - 6.5 K/uL 2.9   Lymphocytes Relative % 35   Lymphs Abs 1.0 - 3.6 K/uL 2.0   Monocytes Relative % 10   Monocytes Absolute 0.2 - 0.9 K/uL 0.5   Eosinophils Relative % 2   Eosinophils Absolute 0 - 0.7 K/uL 0.1   Basophils Relative % 1   Basophils Absolute 0 - 0.1 K/uL 0.1   Resulting Agency  [facility]      Specimen Collected: 06/10/17 14:53

## 2017-06-28 ENCOUNTER — Inpatient Hospital Stay: Payer: Medicare Other | Attending: Hematology and Oncology

## 2017-06-28 VITALS — BP 135/77 | HR 88 | Temp 98.8°F | Resp 18

## 2017-06-28 DIAGNOSIS — D509 Iron deficiency anemia, unspecified: Secondary | ICD-10-CM | POA: Diagnosis present

## 2017-06-28 DIAGNOSIS — D649 Anemia, unspecified: Secondary | ICD-10-CM

## 2017-06-28 MED ORDER — IRON SUCROSE 20 MG/ML IV SOLN
200.0000 mg | Freq: Once | INTRAVENOUS | Status: AC
  Administered 2017-06-28: 200 mg via INTRAVENOUS
  Filled 2017-06-28: qty 10

## 2017-06-28 MED ORDER — SODIUM CHLORIDE 0.9 % IV SOLN
Freq: Once | INTRAVENOUS | Status: AC
Start: 2017-06-28 — End: 2017-06-28
  Administered 2017-06-28: 14:00:00 via INTRAVENOUS
  Filled 2017-06-28: qty 1000

## 2017-07-05 ENCOUNTER — Inpatient Hospital Stay: Payer: Medicare Other

## 2017-07-05 ENCOUNTER — Other Ambulatory Visit: Payer: Self-pay | Admitting: Urgent Care

## 2017-07-05 VITALS — BP 132/78 | HR 78 | Temp 98.0°F | Resp 18

## 2017-07-05 DIAGNOSIS — D649 Anemia, unspecified: Secondary | ICD-10-CM

## 2017-07-05 DIAGNOSIS — D509 Iron deficiency anemia, unspecified: Secondary | ICD-10-CM | POA: Diagnosis not present

## 2017-07-05 MED ORDER — IRON SUCROSE 20 MG/ML IV SOLN
200.0000 mg | Freq: Once | INTRAVENOUS | Status: AC
  Administered 2017-07-05: 200 mg via INTRAVENOUS
  Filled 2017-07-05: qty 10

## 2017-07-05 MED ORDER — SODIUM CHLORIDE 0.9 % IV SOLN
Freq: Once | INTRAVENOUS | Status: AC
  Administered 2017-07-05: 14:00:00 via INTRAVENOUS
  Filled 2017-07-05: qty 1000

## 2017-07-30 NOTE — Progress Notes (Signed)
[place]  Telephone:([phone] Fax:(336) [phone]  ID: Valerie Duran OB: [birth year]  MR#: [id number]  FHQ#:[id number]  Patient Care Team: Derinda Late, MD as PCP - General (Family Medicine) Marry Guan, Laurice Record, MD (Orthopedic Surgery)  CHIEF COMPLAINT: Iron deficiency anemia  INTERVAL HISTORY: Patient is a 72 year old female who was previously evaluated by another provider who returns to clinic today for repeat laboratory work, further evaluation, and consideration of additional IV iron.  She continues to have chronic weakness and fatigue, but this is unchanged.  She has no neurologic complaints.  She denies any recent fevers or illnesses.  She has a good appetite and denies weight loss.  She has no chest pain or shortness of breath.  She denies any nausea, vomiting, constipation, or diarrhea.  She denies any melena or hematochezia.  She has no urinary complaints.  Patient offers no further specific complaints today.  REVIEW OF SYSTEMS:   Review of Systems  Constitutional: Positive for malaise/fatigue. Negative for fever and weight loss.  Respiratory: Negative.  Negative for cough, hemoptysis and shortness of breath.   Cardiovascular: Negative.  Negative for chest pain and leg swelling.  Gastrointestinal: Negative.  Negative for abdominal pain, blood in stool and melena.  Genitourinary: Negative.  Negative for hematuria.  Musculoskeletal: Negative.   Skin: Negative.  Negative for rash.  Neurological: Positive for weakness.  Psychiatric/Behavioral: The patient is nervous/anxious.     As per HPI. Otherwise, a complete review of systems is negative.  PAST MEDICAL HISTORY: Past Medical History:  Diagnosis Date  . Anemia   . Arthritis   . Back pain   . Celiac disease   . Depression   . GERD (gastroesophageal reflux disease)   . Heart murmur     PAST SURGICAL HISTORY: Past Surgical History:  Procedure Laterality Date  . ABDOMINAL HYSTERECTOMY    . BACK  SURGERY     spinal fusion  . CATARACT EXTRACTION W/ INTRAOCULAR LENS  IMPLANT, BILATERAL Bilateral   . [place]  . FOOT SURGERY Right 2007  . HARDWARE REMOVAL Right 01/14/2015   Procedure: HARDWARE REMOVAL/ REMOVAL OF 3 CANNULATED SCREWS FROM RIGHT HIP.;  Surgeon: Dereck Leep, MD;  Location: [facility] ORS;  Service: Orthopedics;  Laterality: Right;  . HIP FRACTURE SURGERY Right 2016  . JOINT REPLACEMENT Bilateral 2014   knees  . NECK SURGERY     fusion  . TOTAL HIP ARTHROPLASTY Right 01/14/2015   Procedure: TOTAL HIP ARTHROPLASTY;  Surgeon: Dereck Leep, MD;  Location: [facility] ORS;  Service: Orthopedics;  Laterality: Right;    FAMILY HISTORY: Family History  Problem Relation Age of Onset  . Cancer Father 1       Colon Cancer  . Breast cancer Father 60    ADVANCED DIRECTIVES (Y/N):  N  HEALTH MAINTENANCE: Social History   Tobacco Use  . Smoking status: Never Smoker  . Smokeless tobacco: Never Used  Substance Use Topics  . Alcohol use: No  . Drug use: No     Colonoscopy:  PAP:  Bone density:  Lipid panel:  Allergies  Allergen Reactions  . Ambien [Zolpidem Tartrate] Other (See Comments)    Altered mental status  . Barley Grass Other (See Comments)    Celiac disease  . Codeine Nausea Only  . Tape Other (See Comments)    Adhesive tape - tears skin  . Valium [Diazepam] Other (See Comments)    Sleep walks  . Ibuprofen Other (See  Comments) and Rash    GI upset " makes my stomach hurt"  . Simvastatin Rash  . Wheat Bran Rash    Celiac disease    Current Outpatient Medications  Medication Sig Dispense Refill  . Biotin 10000 MCG TABS Take by mouth.    . Calcium Carbonate-Vitamin D (CALCIUM 600 + D PO) Take 1 tablet by mouth 2 (two) times daily.     Marland Kitchen glucosamine-chondroitin 500-400 MG tablet Take 1 tablet by mouth 3 (three) times daily.    . Magnesium 500 MG CAPS Take 1 capsule by mouth every morning.     . pramipexole (MIRAPEX) 1 MG tablet  Take 1 tablet by mouth 2 (two) times daily.      No current facility-administered medications for this visit.    Facility-Administered Medications Ordered in Other Visits  Medication Dose Route Frequency Provider Last Rate Last Dose  . 0.9 %  sodium chloride infusion   Intravenous Once Nolon Stalls C, MD        OBJECTIVE: Vitals:   08/02/17 1123  BP: (!) 152/88  Pulse: 87  Resp: 20  Temp: (!) 96.4 F (35.8 C)     Body mass index is 31.78 kg/m.    ECOG FS:0 - Asymptomatic  General: Well-developed, well-nourished, no acute distress. Eyes: Pink conjunctiva, anicteric sclera. HEENT: Normocephalic, moist mucous membranes, clear oropharnyx. Lungs: Clear to auscultation bilaterally. Heart: Regular rate and rhythm. No rubs, murmurs, or gallops. Abdomen: Soft, nontender, nondistended. No organomegaly noted, normoactive bowel sounds. Musculoskeletal: No edema, cyanosis, or clubbing. Neuro: Alert, answering all questions appropriately. Cranial nerves grossly intact. Skin: No rashes or petechiae noted. Psych: Normal affect. Lymphatics: No cervical, calvicular, axillary or inguinal LAD.   LAB RESULTS:  Lab Results  Component Value Date   NA 137 12/10/2016   K 3.8 12/10/2016   CL 104 12/10/2016   CO2 26 12/10/2016   GLUCOSE 164 (H) 12/10/2016   BUN 12 12/10/2016   CREATININE 0.71 12/10/2016   CALCIUM 9.1 12/10/2016   PROT 7.6 12/10/2016   ALBUMIN 3.6 12/10/2016   AST 25 12/10/2016   ALT 16 12/10/2016   ALKPHOS 83 12/10/2016   BILITOT 0.4 12/10/2016   GFRNONAA >60 12/10/2016   GFRAA >60 12/10/2016    Lab Results  Component Value Date   WBC 6.1 08/02/2017   NEUTROABS 3.5 08/02/2017   HGB 14.0 08/02/2017   HCT 41.8 08/02/2017   MCV 88.8 08/02/2017   PLT 327 08/02/2017   Lab Results  Component Value Date   IRON 59 08/02/2017   TIBC 307 08/02/2017   IRONPCTSAT 19 08/02/2017   Lab Results  Component Value Date   FERRITIN 105 08/02/2017     STUDIES: No  results found.  ASSESSMENT: Iron deficiency anemia  PLAN:    1. Iron deficiency anemia: Patient's hemoglobin and iron stores continue to be within normal limits.  Previously, the remainder of her laboratory work was reported as either negative or within normal limits.  She last received IV Venofer in January 2019.  Patient does not require additional treatment at this time.  Return to clinic in 4 months with repeat laboratory work and further evaluation. 2. Celiac disease: Continue monitoring and evaluation per GI.  Patient patient states because of this she has difficulty absorbing oral iron supplementation.  Approximately 30 minutes spent in discussion of which greater than 50% was consultation.  Patient expressed understanding and was in agreement with this plan. She also understands that She can call clinic at  any time with any questions, concerns, or complaints.    Lloyd Huger, MD   08/04/2017 2:59 PM

## 2017-08-02 ENCOUNTER — Inpatient Hospital Stay: Payer: Medicare Other | Attending: Oncology | Admitting: Oncology

## 2017-08-02 ENCOUNTER — Inpatient Hospital Stay: Payer: Medicare Other

## 2017-08-02 VITALS — BP 152/88 | HR 87 | Temp 96.4°F | Resp 20 | Wt 209.0 lb

## 2017-08-02 DIAGNOSIS — K900 Celiac disease: Secondary | ICD-10-CM | POA: Insufficient documentation

## 2017-08-02 DIAGNOSIS — D509 Iron deficiency anemia, unspecified: Secondary | ICD-10-CM | POA: Insufficient documentation

## 2017-08-02 LAB — CBC WITH DIFFERENTIAL/PLATELET
BASOS PCT: 1 %
Basophils Absolute: 0.1 10*3/uL (ref 0–0.1)
EOS ABS: 0.1 10*3/uL (ref 0–0.7)
Eosinophils Relative: 2 %
HCT: 41.8 % (ref 35.0–47.0)
HEMOGLOBIN: 14 g/dL (ref 12.0–16.0)
LYMPHS ABS: 1.9 10*3/uL (ref 1.0–3.6)
Lymphocytes Relative: 31 %
MCH: 29.7 pg (ref 26.0–34.0)
MCHC: 33.5 g/dL (ref 32.0–36.0)
MCV: 88.8 fL (ref 80.0–100.0)
Monocytes Absolute: 0.6 10*3/uL (ref 0.2–0.9)
Monocytes Relative: 9 %
NEUTROS PCT: 57 %
Neutro Abs: 3.5 10*3/uL (ref 1.4–6.5)
Platelets: 327 10*3/uL (ref 150–440)
RBC: 4.7 MIL/uL (ref 3.80–5.20)
RDW: 13.8 % (ref 11.5–14.5)
WBC: 6.1 10*3/uL (ref 3.6–11.0)

## 2017-08-02 LAB — FERRITIN: FERRITIN: 105 ng/mL (ref 11–307)

## 2017-08-02 LAB — IRON AND TIBC
Iron: 59 ug/dL (ref 28–170)
SATURATION RATIOS: 19 % (ref 10.4–31.8)
TIBC: 307 ug/dL (ref 250–450)
UIBC: 248 ug/dL

## 2017-08-02 NOTE — Progress Notes (Signed)
Patient reports fatigue today.

## 2017-08-05 ENCOUNTER — Telehealth: Payer: Self-pay | Admitting: *Deleted

## 2017-08-05 NOTE — Telephone Encounter (Signed)
-----   Message from Wilburn Cornelia sent at 08/05/2017 11:26 AM EST ----- Regarding: lab results Contact: [phone] Pt left VM to receive labs results

## 2017-08-05 NOTE — Telephone Encounter (Signed)
Dx:  Iron deficiency anemia, unspecified i...   Ref Range & Units 3d ago  Ferritin 11 - 307 ng/mL 105        Dx:  Iron deficiency anemia, unspecified i...   Ref Range & Units 3d ago  Iron 28 - 170 ug/dL 59   TIBC 250 - 450 ug/dL 307   Saturation Ratios 10.4 - 31.8 % 19   UIBC ug/dL 248        Dx:  Iron deficiency anemia, unspecified i...   Ref Range & Units 3d ago  Iron 28 - 170 ug/dL 59   TIBC 250 - 450 ug/dL 307   Saturation Ratios 10.4 - 31.8 % 19   UIBC ug/dL 248        Patient informed of normal results

## 2017-09-09 ENCOUNTER — Other Ambulatory Visit: Payer: Medicare Other

## 2017-09-09 ENCOUNTER — Ambulatory Visit: Payer: Medicare Other | Admitting: Oncology

## 2017-12-06 ENCOUNTER — Ambulatory Visit: Payer: Medicare Other

## 2017-12-06 ENCOUNTER — Other Ambulatory Visit: Payer: Medicare Other

## 2017-12-06 ENCOUNTER — Ambulatory Visit: Payer: Medicare Other | Admitting: Oncology

## 2017-12-26 NOTE — Progress Notes (Signed)
[place]  Telephone:(336) [phone] Fax:(336) [phone]  ID: Valerie Duran OB: August 03, 1945  MR#: [id number]  QIH#:[id number]  Patient Care Team: Derinda Late, MD as PCP - General (Family Medicine) Marry Guan, Laurice Record, MD (Orthopedic Surgery)  CHIEF COMPLAINT: Iron deficiency anemia  INTERVAL HISTORY: Patient returns to clinic today for repeat laboratory work and further evaluation.  She continues to have chronic weakness and fatigue, but admits this has mildly improved.  She otherwise feels well and is asymptomatic. She has no neurologic complaints.  She denies any recent fevers or illnesses.  She has a good appetite and denies weight loss.  She has no chest pain or shortness of breath.  She denies any nausea, vomiting, constipation, or diarrhea.  She denies any melena or hematochezia.  She has no urinary complaints.  Patient offers no further specific complaints today.  REVIEW OF SYSTEMS:   Review of Systems  Constitutional: Positive for malaise/fatigue. Negative for fever and weight loss.  Respiratory: Negative.  Negative for cough, hemoptysis and shortness of breath.   Cardiovascular: Negative.  Negative for chest pain and leg swelling.  Gastrointestinal: Negative.  Negative for abdominal pain, blood in stool and melena.  Genitourinary: Negative.  Negative for hematuria.  Musculoskeletal: Negative.  Negative for myalgias.  Skin: Negative.  Negative for rash.  Neurological: Positive for weakness. Negative for sensory change, focal weakness and headaches.  Psychiatric/Behavioral: Negative.  The patient is not nervous/anxious.     As per HPI. Otherwise, a complete review of systems is negative.  PAST MEDICAL HISTORY: Past Medical History:  Diagnosis Date  . Anemia   . Arthritis   . Back pain   . Celiac disease   . Depression   . GERD (gastroesophageal reflux disease)   . Heart murmur     PAST SURGICAL HISTORY: Past Surgical History:  Procedure Laterality  Date  . ABDOMINAL HYSTERECTOMY    . BACK SURGERY     spinal fusion  . CATARACT EXTRACTION W/ INTRAOCULAR LENS  IMPLANT, BILATERAL Bilateral   . [place]  . FOOT SURGERY Right 2007  . HARDWARE REMOVAL Right 01/14/2015   Procedure: HARDWARE REMOVAL/ REMOVAL OF 3 CANNULATED SCREWS FROM RIGHT HIP.;  Surgeon: Dereck Leep, MD;  Location: [facility] ORS;  Service: Orthopedics;  Laterality: Right;  . HIP FRACTURE SURGERY Right 2016  . JOINT REPLACEMENT Bilateral 2014   knees  . NECK SURGERY     fusion  . TOTAL HIP ARTHROPLASTY Right 01/14/2015   Procedure: TOTAL HIP ARTHROPLASTY;  Surgeon: Dereck Leep, MD;  Location: [facility] ORS;  Service: Orthopedics;  Laterality: Right;    FAMILY HISTORY: Family History  Problem Relation Age of Onset  . Cancer Father 53       Colon Cancer  . Breast cancer Father 14    ADVANCED DIRECTIVES (Y/N):  N  HEALTH MAINTENANCE: Social History   Tobacco Use  . Smoking status: Never Smoker  . Smokeless tobacco: Never Used  Substance Use Topics  . Alcohol use: No  . Drug use: No     Colonoscopy:  PAP:  Bone density:  Lipid panel:  Allergies  Allergen Reactions  . Ambien [Zolpidem Tartrate] Other (See Comments)    Altered mental status  . Barley Grass Other (See Comments)    Celiac disease  . Codeine Nausea Only  . Tape Other (See Comments)    Adhesive tape - tears skin  . Valium [Diazepam] Other (See Comments)    Sleep  walks  . Ibuprofen Other (See Comments) and Rash    GI upset " makes my stomach hurt"  . Simvastatin Rash  . Wheat Bran Rash    Celiac disease    Current Outpatient Medications  Medication Sig Dispense Refill  . Biotin 10000 MCG TABS Take by mouth.    . Calcium Carbonate-Vitamin D (CALCIUM 600 + D PO) Take 1 tablet by mouth 2 (two) times daily.     . fluticasone (FLONASE) 50 MCG/ACT nasal spray SHAKE LIQUID AND USE 2 SPRAYS IN EACH NOSTRIL EVERY DAY    . glucosamine-chondroitin 500-400 MG tablet Take  1 tablet by mouth 3 (three) times daily.    Marland Kitchen levothyroxine (SYNTHROID, LEVOTHROID) 25 MCG tablet Take 25 mcg by mouth daily before breakfast.     . Magnesium 500 MG CAPS Take 1 capsule by mouth every morning.     . Magnesium Oxide, Antacid, 500 MG CAPS Take by mouth.     . metFORMIN (GLUCOPHAGE-XR) 500 MG 24 hr tablet Take 500 mg by mouth daily with breakfast.     . pramipexole (MIRAPEX) 1 MG tablet Take 1 tablet by mouth 2 (two) times daily.     Marland Kitchen amoxicillin (AMOXIL) 500 MG tablet Take by mouth.    . montelukast (SINGULAIR) 10 MG tablet Take 10 mg by mouth at bedtime.     . pantoprazole (PROTONIX) 40 MG tablet TAKE 1 TABLET BY MOUTH EVERY DAY     No current facility-administered medications for this visit.    Facility-Administered Medications Ordered in Other Visits  Medication Dose Route Frequency Provider Last Rate Last Dose  . 0.9 %  sodium chloride infusion   Intravenous Once Lequita Asal, MD        OBJECTIVE: Vitals:   12/30/17 1436  BP: (!) 168/80  Pulse: 89  Resp: 18  Temp: (!) 95 F (35 C)     Body mass index is 32.08 kg/m.    ECOG FS:0 - Asymptomatic  General: Well-developed, well-nourished, no acute distress. Eyes: Pink conjunctiva, anicteric sclera. Lungs: Clear to auscultation bilaterally. Heart: Regular rate and rhythm. No rubs, murmurs, or gallops. Abdomen: Soft, nontender, nondistended. No organomegaly noted, normoactive bowel sounds. Musculoskeletal: No edema, cyanosis, or clubbing. Neuro: Alert, answering all questions appropriately. Cranial nerves grossly intact. Skin: No rashes or petechiae noted. Psych: Normal affect.  LAB RESULTS:  Lab Results  Component Value Date   NA 137 12/10/2016   K 3.8 12/10/2016   CL 104 12/10/2016   CO2 26 12/10/2016   GLUCOSE 164 (H) 12/10/2016   BUN 12 12/10/2016   CREATININE 0.71 12/10/2016   CALCIUM 9.1 12/10/2016   PROT 7.6 12/10/2016   ALBUMIN 3.6 12/10/2016   AST 25 12/10/2016   ALT 16 12/10/2016    ALKPHOS 83 12/10/2016   BILITOT 0.4 12/10/2016   GFRNONAA >60 12/10/2016   GFRAA >60 12/10/2016    Lab Results  Component Value Date   WBC 5.7 12/30/2017   NEUTROABS 3.3 12/30/2017   HGB 12.9 12/30/2017   HCT 37.8 12/30/2017   MCV 91.3 12/30/2017   PLT 292 12/30/2017   Lab Results  Component Value Date   IRON 51 12/30/2017   TIBC 284 12/30/2017   IRONPCTSAT 18 12/30/2017   Lab Results  Component Value Date   FERRITIN 68 12/30/2017     STUDIES: No results found.  ASSESSMENT: Iron deficiency anemia  PLAN:    1. Iron deficiency anemia: Patient's hemoglobin and iron stores continue to be within  normal limits.  Previously the remainder of her laboratory work was also either negative or within normal limits.  She does not require additional IV iron today. She last received IV Venofer in January 2019.  Return to clinic in 4 months with repeat laboratory work and further evaluation.   2. Celiac disease: Continue monitoring and evaluation per GI.  Patient patient states because of this she has difficulty absorbing oral iron supplementation.  I spent a total of 20 minutes face-to-face with the patient of which greater than 50% of the visit was spent in counseling and coordination of care as detailed above.   Patient expressed understanding and was in agreement with this plan. She also understands that She can call clinic at any time with any questions, concerns, or complaints.    Lloyd Huger, MD   01/04/2018 9:07 AM

## 2017-12-27 ENCOUNTER — Other Ambulatory Visit: Payer: Self-pay | Admitting: Family Medicine

## 2017-12-27 DIAGNOSIS — Z1231 Encounter for screening mammogram for malignant neoplasm of breast: Secondary | ICD-10-CM

## 2017-12-30 ENCOUNTER — Inpatient Hospital Stay (HOSPITAL_BASED_OUTPATIENT_CLINIC_OR_DEPARTMENT_OTHER): Payer: Medicare Other | Admitting: Oncology

## 2017-12-30 ENCOUNTER — Inpatient Hospital Stay: Payer: Medicare Other

## 2017-12-30 ENCOUNTER — Other Ambulatory Visit: Payer: Self-pay

## 2017-12-30 ENCOUNTER — Inpatient Hospital Stay: Payer: Medicare Other | Attending: Oncology

## 2017-12-30 VITALS — BP 168/80 | HR 89 | Temp 95.0°F | Resp 18 | Wt 211.0 lb

## 2017-12-30 DIAGNOSIS — K900 Celiac disease: Secondary | ICD-10-CM | POA: Diagnosis not present

## 2017-12-30 DIAGNOSIS — R531 Weakness: Secondary | ICD-10-CM | POA: Insufficient documentation

## 2017-12-30 DIAGNOSIS — D509 Iron deficiency anemia, unspecified: Secondary | ICD-10-CM

## 2017-12-30 DIAGNOSIS — R5383 Other fatigue: Secondary | ICD-10-CM | POA: Diagnosis not present

## 2017-12-30 DIAGNOSIS — D508 Other iron deficiency anemias: Secondary | ICD-10-CM

## 2017-12-30 LAB — CBC WITH DIFFERENTIAL/PLATELET
Basophils Absolute: 0.1 10*3/uL (ref 0–0.1)
Basophils Relative: 1 %
Eosinophils Absolute: 0.1 10*3/uL (ref 0–0.7)
Eosinophils Relative: 2 %
HCT: 37.8 % (ref 35.0–47.0)
Hemoglobin: 12.9 g/dL (ref 12.0–16.0)
Lymphocytes Relative: 32 %
Lymphs Abs: 1.8 10*3/uL (ref 1.0–3.6)
MCH: 31.2 pg (ref 26.0–34.0)
MCHC: 34.2 g/dL (ref 32.0–36.0)
MCV: 91.3 fL (ref 80.0–100.0)
Monocytes Absolute: 0.4 10*3/uL (ref 0.2–0.9)
Monocytes Relative: 7 %
Neutro Abs: 3.3 10*3/uL (ref 1.4–6.5)
Neutrophils Relative %: 58 %
Platelets: 292 10*3/uL (ref 150–440)
RBC: 4.14 MIL/uL (ref 3.80–5.20)
RDW: 12.6 % (ref 11.5–14.5)
WBC: 5.7 10*3/uL (ref 3.6–11.0)

## 2017-12-30 LAB — IRON AND TIBC
Iron: 51 ug/dL (ref 28–170)
Saturation Ratios: 18 % (ref 10.4–31.8)
TIBC: 284 ug/dL (ref 250–450)
UIBC: 233 ug/dL

## 2017-12-30 LAB — FERRITIN: Ferritin: 68 ng/mL (ref 11–307)

## 2017-12-30 NOTE — Progress Notes (Signed)
Here for follow up. Per pt feeling weak ( better than past  She stated )

## 2017-12-31 ENCOUNTER — Telehealth: Payer: Self-pay | Admitting: *Deleted

## 2017-12-31 NOTE — Telephone Encounter (Signed)
Called patient to inform her that her ferritin is good at this time.  No further infusions needed at this time.  Patient to keep scheduled appointment. Patient verbalized understanding.

## 2017-12-31 NOTE — Telephone Encounter (Signed)
-----   Message from Karen Kitchens, NP sent at 12/30/2017  4:56 PM EDT ----- Ferritin is good. No additional IV iron at this time. RTC as already scheduled.   Gaspar Bidding  ----- Message ----- From: Buel Ream, Lab In [place] Sent: 12/30/2017   2:18 PM To: Lequita Asal, MD

## 2018-01-14 ENCOUNTER — Ambulatory Visit
Admission: RE | Admit: 2018-01-14 | Discharge: 2018-01-14 | Disposition: A | Discharge status: Home / Self Care | Payer: Medicare Other | Source: Ambulatory Visit | Attending: Family Medicine | Admitting: Family Medicine

## 2018-01-14 DIAGNOSIS — Z1231 Encounter for screening mammogram for malignant neoplasm of breast: Secondary | ICD-10-CM | POA: Insufficient documentation

## 2018-01-17 ENCOUNTER — Other Ambulatory Visit: Payer: Self-pay | Admitting: Family Medicine

## 2018-01-17 DIAGNOSIS — N6489 Other specified disorders of breast: Secondary | ICD-10-CM

## 2018-01-17 DIAGNOSIS — R928 Other abnormal and inconclusive findings on diagnostic imaging of breast: Secondary | ICD-10-CM

## 2018-01-21 ENCOUNTER — Ambulatory Visit
Admission: RE | Admit: 2018-01-21 | Discharge: 2018-01-21 | Disposition: A | Discharge status: Home / Self Care | Payer: Medicare Other | Source: Ambulatory Visit | Attending: Family Medicine | Admitting: Family Medicine

## 2018-01-21 DIAGNOSIS — R928 Other abnormal and inconclusive findings on diagnostic imaging of breast: Secondary | ICD-10-CM

## 2018-01-21 DIAGNOSIS — N6489 Other specified disorders of breast: Secondary | ICD-10-CM | POA: Diagnosis present

## 2018-02-03 ENCOUNTER — Encounter: Payer: Self-pay | Admitting: *Deleted

## 2018-02-04 ENCOUNTER — Ambulatory Visit: Payer: Medicare Other | Admitting: Anesthesiology

## 2018-02-04 ENCOUNTER — Ambulatory Visit
Admission: RE | Admit: 2018-02-04 | Discharge: 2018-02-04 | Disposition: A | Discharge status: Home / Self Care | Payer: Medicare Other | Source: Ambulatory Visit | Attending: Unknown Physician Specialty | Admitting: Unknown Physician Specialty

## 2018-02-04 ENCOUNTER — Encounter: Payer: Self-pay | Admitting: *Deleted

## 2018-02-04 ENCOUNTER — Encounter
Admission: RE | Disposition: A | Discharge status: Home / Self Care | Payer: Self-pay | Source: Ambulatory Visit | Attending: Unknown Physician Specialty

## 2018-02-04 DIAGNOSIS — Z1211 Encounter for screening for malignant neoplasm of colon: Secondary | ICD-10-CM | POA: Insufficient documentation

## 2018-02-04 DIAGNOSIS — M549 Dorsalgia, unspecified: Secondary | ICD-10-CM | POA: Insufficient documentation

## 2018-02-04 DIAGNOSIS — Z800 Family history of malignant neoplasm of digestive organs: Secondary | ICD-10-CM | POA: Insufficient documentation

## 2018-02-04 DIAGNOSIS — I341 Nonrheumatic mitral (valve) prolapse: Secondary | ICD-10-CM | POA: Diagnosis not present

## 2018-02-04 DIAGNOSIS — Z803 Family history of malignant neoplasm of breast: Secondary | ICD-10-CM | POA: Insufficient documentation

## 2018-02-04 DIAGNOSIS — Z7984 Long term (current) use of oral hypoglycemic drugs: Secondary | ICD-10-CM | POA: Diagnosis not present

## 2018-02-04 DIAGNOSIS — K900 Celiac disease: Secondary | ICD-10-CM | POA: Diagnosis not present

## 2018-02-04 DIAGNOSIS — M1990 Unspecified osteoarthritis, unspecified site: Secondary | ICD-10-CM | POA: Insufficient documentation

## 2018-02-04 DIAGNOSIS — E119 Type 2 diabetes mellitus without complications: Secondary | ICD-10-CM | POA: Diagnosis not present

## 2018-02-04 DIAGNOSIS — K219 Gastro-esophageal reflux disease without esophagitis: Secondary | ICD-10-CM | POA: Insufficient documentation

## 2018-02-04 DIAGNOSIS — D125 Benign neoplasm of sigmoid colon: Secondary | ICD-10-CM | POA: Insufficient documentation

## 2018-02-04 DIAGNOSIS — Z885 Allergy status to narcotic agent status: Secondary | ICD-10-CM | POA: Insufficient documentation

## 2018-02-04 DIAGNOSIS — K2950 Unspecified chronic gastritis without bleeding: Secondary | ICD-10-CM | POA: Insufficient documentation

## 2018-02-04 DIAGNOSIS — Z9842 Cataract extraction status, left eye: Secondary | ICD-10-CM | POA: Insufficient documentation

## 2018-02-04 DIAGNOSIS — Z79899 Other long term (current) drug therapy: Secondary | ICD-10-CM | POA: Diagnosis not present

## 2018-02-04 DIAGNOSIS — R011 Cardiac murmur, unspecified: Secondary | ICD-10-CM | POA: Diagnosis not present

## 2018-02-04 DIAGNOSIS — K317 Polyp of stomach and duodenum: Secondary | ICD-10-CM | POA: Diagnosis not present

## 2018-02-04 DIAGNOSIS — Z981 Arthrodesis status: Secondary | ICD-10-CM | POA: Diagnosis not present

## 2018-02-04 DIAGNOSIS — Z9841 Cataract extraction status, right eye: Secondary | ICD-10-CM | POA: Insufficient documentation

## 2018-02-04 DIAGNOSIS — Z96641 Presence of right artificial hip joint: Secondary | ICD-10-CM | POA: Insufficient documentation

## 2018-02-04 DIAGNOSIS — Z886 Allergy status to analgesic agent status: Secondary | ICD-10-CM | POA: Insufficient documentation

## 2018-02-04 DIAGNOSIS — K5790 Diverticulosis of intestine, part unspecified, without perforation or abscess without bleeding: Secondary | ICD-10-CM | POA: Insufficient documentation

## 2018-02-04 DIAGNOSIS — D649 Anemia, unspecified: Secondary | ICD-10-CM | POA: Insufficient documentation

## 2018-02-04 DIAGNOSIS — Z96653 Presence of artificial knee joint, bilateral: Secondary | ICD-10-CM | POA: Insufficient documentation

## 2018-02-04 DIAGNOSIS — Z90710 Acquired absence of both cervix and uterus: Secondary | ICD-10-CM | POA: Insufficient documentation

## 2018-02-04 DIAGNOSIS — F329 Major depressive disorder, single episode, unspecified: Secondary | ICD-10-CM | POA: Diagnosis not present

## 2018-02-04 DIAGNOSIS — K640 First degree hemorrhoids: Secondary | ICD-10-CM | POA: Insufficient documentation

## 2018-02-04 DIAGNOSIS — Z91048 Other nonmedicinal substance allergy status: Secondary | ICD-10-CM | POA: Insufficient documentation

## 2018-02-04 DIAGNOSIS — Z91018 Allergy to other foods: Secondary | ICD-10-CM | POA: Insufficient documentation

## 2018-02-04 DIAGNOSIS — Z888 Allergy status to other drugs, medicaments and biological substances status: Secondary | ICD-10-CM | POA: Insufficient documentation

## 2018-02-04 HISTORY — DX: Nonrheumatic mitral (valve) prolapse: I34.1

## 2018-02-04 HISTORY — DX: Type 2 diabetes mellitus without complications: E11.9

## 2018-02-04 HISTORY — PX: ESOPHAGOGASTRODUODENOSCOPY (EGD) WITH PROPOFOL: SHX5813

## 2018-02-04 HISTORY — PX: COLONOSCOPY WITH PROPOFOL: SHX5780

## 2018-02-04 LAB — GLUCOSE, CAPILLARY: Glucose-Capillary: 156 mg/dL — ABNORMAL HIGH (ref 70–99)

## 2018-02-04 SURGERY — ESOPHAGOGASTRODUODENOSCOPY (EGD) WITH PROPOFOL
Anesthesia: General

## 2018-02-04 MED ORDER — FENTANYL CITRATE (PF) 100 MCG/2ML IJ SOLN
INTRAMUSCULAR | Status: AC
  Filled 2018-02-04: qty 2

## 2018-02-04 MED ORDER — PROPOFOL 10 MG/ML IV BOLUS
INTRAVENOUS | Status: DC | PRN
  Administered 2018-02-04 (×4): 20 mg via INTRAVENOUS

## 2018-02-04 MED ORDER — SODIUM CHLORIDE 0.9 % IV SOLN
INTRAVENOUS | Status: DC
  Administered 2018-02-04: 08:00:00 via INTRAVENOUS

## 2018-02-04 MED ORDER — FENTANYL CITRATE (PF) 100 MCG/2ML IJ SOLN
INTRAMUSCULAR | Status: DC | PRN
  Administered 2018-02-04 (×4): 25 ug via INTRAVENOUS

## 2018-02-04 MED ORDER — PIPERACILLIN-TAZOBACTAM 3.375 G IVPB
INTRAVENOUS | Status: AC
  Filled 2018-02-04: qty 50

## 2018-02-04 MED ORDER — PROPOFOL 500 MG/50ML IV EMUL
INTRAVENOUS | Status: DC | PRN
  Administered 2018-02-04: 25 ug/kg/min via INTRAVENOUS

## 2018-02-04 MED ORDER — LIDOCAINE HCL (PF) 2 % IJ SOLN
INTRAMUSCULAR | Status: AC
  Filled 2018-02-04: qty 10

## 2018-02-04 MED ORDER — GLYCOPYRROLATE 0.2 MG/ML IJ SOLN
INTRAMUSCULAR | Status: AC
  Filled 2018-02-04: qty 1

## 2018-02-04 MED ORDER — MIDAZOLAM HCL 5 MG/5ML IJ SOLN
INTRAMUSCULAR | Status: DC | PRN
  Administered 2018-02-04: 2 mg via INTRAVENOUS

## 2018-02-04 MED ORDER — LIDOCAINE HCL (PF) 2 % IJ SOLN
INTRAMUSCULAR | Status: DC | PRN
  Administered 2018-02-04: 100 mg

## 2018-02-04 MED ORDER — MIDAZOLAM HCL 2 MG/2ML IJ SOLN
INTRAMUSCULAR | Status: AC
  Filled 2018-02-04: qty 2

## 2018-02-04 MED ORDER — PIPERACILLIN-TAZOBACTAM 3.375 G IVPB 30 MIN
3.3750 g | Freq: Once | INTRAVENOUS | Status: AC
  Administered 2018-02-04: 3.375 g via INTRAVENOUS
  Filled 2018-02-04: qty 50

## 2018-02-04 MED ORDER — SODIUM CHLORIDE 0.9 % IV SOLN: INTRAVENOUS | Status: DC

## 2018-02-04 MED ORDER — PROPOFOL 500 MG/50ML IV EMUL
INTRAVENOUS | Status: AC
  Filled 2018-02-04: qty 50

## 2018-02-04 MED ORDER — GLYCOPYRROLATE 0.2 MG/ML IJ SOLN
INTRAMUSCULAR | Status: DC | PRN
  Administered 2018-02-04: 0.2 mg via INTRAVENOUS

## 2018-02-04 NOTE — Anesthesia Post-op Follow-up Note (Signed)
Anesthesia QCDR form completed.        

## 2018-02-04 NOTE — Op Note (Signed)
[facility] Patient Name: Valerie Duran Procedure Date: 02/04/2018 8:13 AM MRN: [id number] Account #: [id number] Date of Birth: [birth year] Admit Type: Outpatient Age: 72 Room: [facility] ENDO ROOM 1 Gender: Female Note Status: Finalized Procedure:            Colonoscopy Indications:          Screening in patient at increased risk: Family history                        of 1st-degree relative with colorectal cancer Providers:            Manya Silvas, MD Referring MD:         Caprice Renshaw MD (Referring MD) Medicines:            Propofol per Anesthesia Complications:        No immediate complications. Procedure:            Pre-Anesthesia Assessment:                       - After reviewing the risks and benefits, the patient                        was deemed in satisfactory condition to undergo the                        procedure.                       After obtaining informed consent, the colonoscope was                        passed under direct vision. Throughout the procedure,                        the patient's blood pressure, pulse, and oxygen                        saturations were monitored continuously. The was                        introduced through the anus and advanced to the the                        cecum, identified by appendiceal orifice and ileocecal                        valve. The colonoscopy was performed without                        difficulty. The patient tolerated the procedure well.                        The quality of the bowel preparation was adequate to                        identify polyps. Findings:      A diminutive polyp was found in the sigmoid colon. The polyp was       sessile. The polyp was removed with a hot snare. Resection and retrieval       were complete.  Multiple small and large-mouthed diverticula were found in the sigmoid       colon, descending colon and transverse colon.      Internal  hemorrhoids were found during endoscopy. The hemorrhoids were       small and Grade I (internal hemorrhoids that do not prolapse).      The exam was otherwise without abnormality. Impression:           - One diminutive polyp in the sigmoid colon, removed                        with a hot snare. Resected and retrieved.                       - Diverticulosis in the sigmoid colon, in the                        descending colon and in the transverse colon.                       - Internal hemorrhoids.                       - The examination was otherwise normal. Recommendation:       - Await pathology results. Manya Silvas, MD 02/04/2018 9:08:48 AM This report has been signed electronically. Number of Addenda: 0 Note Initiated On: 02/04/2018 8:13 AM Scope Withdrawal Time: 0 hours 8 minutes 51 seconds  Total Procedure Duration: 0 hours 22 minutes 3 seconds       [facility]

## 2018-02-04 NOTE — H&P (Signed)
Primary Care Physician:  Derinda Late, MD Primary Gastroenterologist:  Dr. Vira Agar  Pre-Procedure History & Physical: HPI:  Valerie Duran is a 72 y.o. female is here for an endoscopy and colonoscopy.  For celiac disease and FH colon cancer.   Past Medical History:  Diagnosis Date  . Anemia   . Arthritis   . Back pain   . Celiac disease   . Depression   . Diabetes mellitus without complication ([place])   . GERD (gastroesophageal reflux disease)   . Heart murmur   . MVP (mitral valve prolapse)     Past Surgical History:  Procedure Laterality Date  . ABDOMINAL HYSTERECTOMY    . BACK SURGERY     spinal fusion  . CATARACT EXTRACTION W/ INTRAOCULAR LENS  IMPLANT, BILATERAL Bilateral   . [place]  . FOOT SURGERY Right 2007  . HARDWARE REMOVAL Right 01/14/2015   Procedure: HARDWARE REMOVAL/ REMOVAL OF 3 CANNULATED SCREWS FROM RIGHT HIP.;  Surgeon: Dereck Leep, MD;  Location: [facility] ORS;  Service: Orthopedics;  Laterality: Right;  . HIP FRACTURE SURGERY Right 2016  . JOINT REPLACEMENT Bilateral 2014   knees  . NECK SURGERY     fusion  . TOTAL HIP ARTHROPLASTY Right 01/14/2015   Procedure: TOTAL HIP ARTHROPLASTY;  Surgeon: Dereck Leep, MD;  Location: [facility] ORS;  Service: Orthopedics;  Laterality: Right;    Prior to Admission medications   Medication Sig Start Date End Date Taking? Authorizing Provider  Calcium Carbonate-Vitamin D (CALCIUM 600 + D PO) Take 1 tablet by mouth 2 (two) times daily.    Yes [provider]  levothyroxine (SYNTHROID, LEVOTHROID) 25 MCG tablet Take 25 mcg by mouth daily before breakfast.  09/09/17 09/09/18 Yes [provider]  Magnesium 500 MG CAPS Take 1 capsule by mouth every morning.    Yes [provider]  metFORMIN (GLUCOPHAGE-XR) 500 MG 24 hr tablet Take 500 mg by mouth daily with breakfast.  09/09/17 09/09/18 Yes [provider]  pantoprazole (PROTONIX) 40 MG tablet TAKE 1 TABLET BY MOUTH  EVERY DAY 08/05/17  Yes [provider]  amoxicillin (AMOXIL) 500 MG tablet Take by mouth. Take 537m by mouth prior to dental procedures    [provider]  Biotin 10000 MCG TABS Take by mouth.    [provider]  fluticasone (FLONASE) 50 MCG/ACT nasal spray SHAKE LIQUID AND USE 2 SPRAYS IN EACH NOSTRIL EVERY DAY 12/13/17   [provider]  glucosamine-chondroitin 500-400 MG tablet Take 1 tablet by mouth 3 (three) times daily.    [provider]  Magnesium Oxide, Antacid, 500 MG CAPS Take by mouth.     [provider]  montelukast (SINGULAIR) 10 MG tablet Take 10 mg by mouth at bedtime.  08/17/17 08/17/18  [provider]  pramipexole (MIRAPEX) 1 MG tablet Take 1 tablet by mouth 2 (two) times daily.  06/28/14   [provider]    Allergies as of 11/16/2017 - Review Complete 03/11/2017  Allergen Reaction Noted  . Ambien [zolpidem tartrate] Other (See Comments) 12/13/2014  . Barley grass Other (See Comments) 12/13/2014  . Codeine Nausea Only 12/13/2014  . Tape Other (See Comments) 12/13/2014  . Valium [diazepam] Other (See Comments) 01/09/2015  . Ibuprofen Other (See Comments) and Rash 05/01/2011  . Simvastatin Rash 12/13/2014  . Wheat bran Rash 12/13/2014    Family History  Problem Relation Age of Onset  . Cancer Father 629  Colon Cancer  . Breast cancer Father 38  . Breast cancer Sister     Social History   Socioeconomic History  . Marital status: Widowed    Spouse name: Not on file  . Number of children: Not on file  . Years of education: Not on file  . Highest education level: Not on file  Occupational History  . Not on file  Social Needs  . Financial resource strain: Not on file  . Food insecurity:    Worry: Not on file    Inability: Not on file  . Transportation needs:    Medical: Not on file    Non-medical: Not on file  Tobacco Use  . Smoking status: Never Smoker  . Smokeless tobacco: Never  Used  Substance and Sexual Activity  . Alcohol use: No  . Drug use: No  . Sexual activity: Yes    Birth control/protection: Surgical  Lifestyle  . Physical activity:    Days per week: Not on file    Minutes per session: Not on file  . Stress: Not on file  Relationships  . Social connections:    Talks on phone: Not on file    Gets together: Not on file    Attends religious service: Not on file    Active member of club or organization: Not on file    Attends meetings of clubs or organizations: Not on file    Relationship status: Not on file  . Intimate partner violence:    Fear of current or ex partner: Not on file    Emotionally abused: Not on file    Physically abused: Not on file    Forced sexual activity: Not on file  Other Topics Concern  . Not on file  Social History Narrative  . Not on file    Review of Systems: See HPI, otherwise negative ROS  Physical Exam: BP (!) 148/83   Pulse 81   Temp (!) 96.8 F (36 C)   Resp 18   Ht 5' 8"  (1.727 m)   Wt 93.4 kg   SpO2 99%   BMI 31.32 kg/m  General:   Alert,  pleasant and cooperative in NAD Head:  Normocephalic and atraumatic. Neck:  Supple; no masses or thyromegaly. Lungs:  Clear throughout to auscultation.    Heart:  Regular rate and rhythm. Abdomen:  Soft, nontender and nondistended. Normal bowel sounds, without guarding, and without rebound.   Neurologic:  Alert and  oriented x4;  grossly normal neurologically.  Impression/Plan: Valerie Duran is here for an endoscopy and colonoscopy to be performed for FH colon cancer and Celiac disease.  Risks, benefits, limitations, and alternatives regarding  endoscopy and colonoscopy have been reviewed with the patient.  Questions have been answered.  All parties agreeable.   Gaylyn Cheers, MD  02/04/2018, 8:18 AM

## 2018-02-04 NOTE — Op Note (Signed)
[facility] Gastroenterology Patient Name: Valerie Duran Procedure Date: 02/04/2018 8:15 AM MRN: [id number] Account #: [id number] Date of Birth: January 18, 1946 Admit Type: Outpatient Age: 72 Room: [facility] ENDO ROOM 1 Gender: Female Note Status: Finalized Procedure:            Upper GI endoscopy Indications:          Follow-up of celiac disease Providers:            Manya Silvas, MD Referring MD:         Caprice Renshaw MD (Referring MD) Medicines:            Propofol per Anesthesia Complications:        No immediate complications. Procedure:            Pre-Anesthesia Assessment:                       - After reviewing the risks and benefits, the patient                        was deemed in satisfactory condition to undergo the                        procedure.                       After obtaining informed consent, the endoscope was                        passed under direct vision. Throughout the procedure,                        the patient's blood pressure, pulse, and oxygen                        saturations were monitored continuously. The Endoscope                        was introduced through the mouth, and advanced to the                        second part of duodenum. The upper GI endoscopy was                        accomplished without difficulty. The patient tolerated                        the procedure well. Findings:      The examined esophagus was normal.      Multiple small sessile polyps with no bleeding and no stigmata of recent       bleeding were found in the gastric body and in the gastric antrum.       Biopsies were taken with a cold forceps for histology.      Patchy mild inflammation characterized by erythema and granularity was       found in the gastric body and in the gastric antrum.      The duodenal bulb, second portion of the duodenum and third portion of       the duodenum were normal. Biopsies for histology were taken with a cold        forceps for evaluation of celiac  disease. Impression:           - Normal esophagus.                       - Multiple gastric polyps. Biopsied.                       - Gastritis.                       - Normal duodenal bulb, second portion of the duodenum                        and third portion of the duodenum. Biopsied. Recommendation:       - Await pathology results. Manya Silvas, MD 02/04/2018 8:40:04 AM This report has been signed electronically. Number of Addenda: 0 Note Initiated On: 02/04/2018 8:15 AM      [facility]

## 2018-02-04 NOTE — Anesthesia Preprocedure Evaluation (Addendum)
Anesthesia Evaluation  Patient identified by MRN, date of birth, ID band Patient awake    Reviewed: Allergy & Precautions, NPO status , Patient's Chart, lab work & pertinent test results  Airway Mallampati: III  TM Distance: <3 FB     Dental  (+) Teeth Intact   Pulmonary neg pulmonary ROS,    Pulmonary exam normal        Cardiovascular negative cardio ROS Normal cardiovascular exam+ Valvular Problems/Murmurs MVP      Neuro/Psych PSYCHIATRIC DISORDERS Depression negative neurological ROS     GI/Hepatic Neg liver ROS, GERD  Medicated,  Endo/Other  negative endocrine ROSdiabetes  Renal/GU negative Renal ROS     Musculoskeletal  (+) Arthritis , Osteoarthritis,    Abdominal Normal abdominal exam  (+)   Peds negative pediatric ROS (+)  Hematology  (+) anemia ,   Anesthesia Other Findings Past Medical History: No date: Anemia No date: Arthritis No date: Back pain No date: Celiac disease No date: Depression No date: Diabetes mellitus without complication (HCC) No date: GERD (gastroesophageal reflux disease) No date: Heart murmur No date: MVP (mitral valve prolapse)  Reproductive/Obstetrics                            Anesthesia Physical  Anesthesia Plan  ASA: II  Anesthesia Plan: General   Post-op Pain Management:    Induction: Intravenous  PONV Risk Score and Plan:   Airway Management Planned: Nasal Cannula  Additional Equipment:   Intra-op Plan:   Post-operative Plan:   Informed Consent: I have reviewed the patients History and Physical, chart, labs and discussed the procedure including the risks, benefits and alternatives for the proposed anesthesia with the patient or authorized representative who has indicated his/her understanding and acceptance.   Dental advisory given  Plan Discussed with: CRNA  Anesthesia Plan Comments:         Anesthesia Quick  Evaluation

## 2018-02-04 NOTE — Transfer of Care (Signed)
Immediate Anesthesia Transfer of Care Note  Patient: Valerie Duran  Procedure(s) Performed: ESOPHAGOGASTRODUODENOSCOPY (EGD) WITH PROPOFOL (N/A ) COLONOSCOPY WITH PROPOFOL (N/A )  Patient Location: PACU  Anesthesia Type:General  Level of Consciousness: awake, alert  and oriented  Airway & Oxygen Therapy: Patient Spontanous Breathing  Post-op Assessment: Report given to RN and Post -op Vital signs reviewed and stable  Post vital signs: Reviewed and stable  Last Vitals:  Vitals Value Taken Time  BP 139/73 02/04/2018  9:09 AM  Temp 36.1 C 02/04/2018  9:08 AM  Pulse 97 02/04/2018  9:12 AM  Resp 20 02/04/2018  9:12 AM  SpO2 99 % 02/04/2018  9:12 AM  Vitals shown include unvalidated device data.  Last Pain:  Vitals:   02/04/18 0908  TempSrc: Tympanic         Complications: No apparent anesthesia complications

## 2018-02-05 NOTE — Anesthesia Postprocedure Evaluation (Signed)
Anesthesia Post Note  Patient: Valerie Duran  Procedure(s) Performed: ESOPHAGOGASTRODUODENOSCOPY (EGD) WITH PROPOFOL (N/A ) COLONOSCOPY WITH PROPOFOL (N/A )  Patient location during evaluation: PACU Anesthesia Type: General Level of consciousness: awake and alert and oriented Pain management: pain level controlled Vital Signs Assessment: post-procedure vital signs reviewed and stable Respiratory status: spontaneous breathing Cardiovascular status: blood pressure returned to baseline Anesthetic complications: no     Last Vitals:  Vitals:   02/04/18 0742 02/04/18 0908  BP: (!) 148/83 139/73  Pulse: 81 (!) 104  Resp: 18 20  Temp: (!) 36 C (!) 36.1 C  SpO2: 99% 99%    Last Pain:  Vitals:   02/04/18 0948  TempSrc:   PainSc: 0-No pain                 Harsh Trulock

## 2018-02-07 ENCOUNTER — Encounter: Payer: Self-pay | Admitting: Unknown Physician Specialty

## 2018-02-08 LAB — SURGICAL PATHOLOGY

## 2018-02-25 ENCOUNTER — Other Ambulatory Visit: Payer: Self-pay | Admitting: Neurosurgery

## 2018-02-25 DIAGNOSIS — G8929 Other chronic pain: Secondary | ICD-10-CM

## 2018-02-25 DIAGNOSIS — M5442 Lumbago with sciatica, left side: Principal | ICD-10-CM

## 2018-02-28 NOTE — Progress Notes (Addendum)
Cardiology Office Note  Date:  03/01/2018   ID:  Anndee, Connett 1945/08/02, MRN [id number]  PCP:  Derinda Late, MD   Chief Complaint  Patient presents with  . other    C/o chest discomfort and arm numbness. Meds reviewed verbally with pt.    HPI:  Ms. Dera Vanaken is a 72 year old woman with past medical history of Colon cancer Diabetes Iron def anemia Back pain Celiac disease hypertension  presents with symptoms of Chest discomfort, arm numbness  3 to 4 weeks ago, woke up at 3 Am Could not breath,  Walked outside Nausea/vomiting several times, kept going until nothing else would come up [place] came out Felt hot all morning, paced the floor Did not wake husband. Started praying Finally eased off, Felt tight all day  Same thing happened years ago, during the day, nausea  Still feel a little heavy in the chest  Weight loss  Total chol 180 Nonsmoker HBa1C  6.5 to 7.8  EKG personally reviewed by myself on todays visit Shows normal sinus rhythm with rate 95 bpm poor R-wave progression through the anterior precordial leads unable to exclude anterior MI   PMH:   has a past medical history of Anemia, Arthritis, Back pain, Celiac disease, Depression, Diabetes mellitus without complication ([place]), GERD (gastroesophageal reflux disease), Heart murmur, and MVP (mitral valve prolapse).  PSH:    Past Surgical History:  Procedure Laterality Date  . ABDOMINAL HYSTERECTOMY    . BACK SURGERY     spinal fusion  . CATARACT EXTRACTION W/ INTRAOCULAR LENS  IMPLANT, BILATERAL Bilateral   . [place]  . COLONOSCOPY WITH PROPOFOL N/A 02/04/2018   Procedure: COLONOSCOPY WITH PROPOFOL;  Surgeon: Manya Silvas, MD;  Location: [facility] ENDOSCOPY;  Service: Endoscopy;  Laterality: N/A;  . ESOPHAGOGASTRODUODENOSCOPY (EGD) WITH PROPOFOL N/A 02/04/2018   Procedure: ESOPHAGOGASTRODUODENOSCOPY (EGD) WITH PROPOFOL;  Surgeon: Manya Silvas, MD;  Location: [facility]  ENDOSCOPY;  Service: Endoscopy;  Laterality: N/A;  . FOOT SURGERY Right 2007  . HARDWARE REMOVAL Right 01/14/2015   Procedure: HARDWARE REMOVAL/ REMOVAL OF 3 CANNULATED SCREWS FROM RIGHT HIP.;  Surgeon: Dereck Leep, MD;  Location: [facility] ORS;  Service: Orthopedics;  Laterality: Right;  . HIP FRACTURE SURGERY Right 2016  . JOINT REPLACEMENT Bilateral 2014   knees  . NECK SURGERY     fusion  . TOTAL HIP ARTHROPLASTY Right 01/14/2015   Procedure: TOTAL HIP ARTHROPLASTY;  Surgeon: Dereck Leep, MD;  Location: [facility] ORS;  Service: Orthopedics;  Laterality: Right;    Current Outpatient Medications  Medication Sig Dispense Refill  . amoxicillin (AMOXIL) 500 MG tablet Take by mouth. Take 513m by mouth prior to dental procedures    . Biotin 10000 MCG TABS Take by mouth.    . Calcium Carbonate-Vitamin D (CALCIUM 600 + D PO) Take 1 tablet by mouth 2 (two) times daily.     . fluticasone (FLONASE) 50 MCG/ACT nasal spray SHAKE LIQUID AND USE 2 SPRAYS IN EACH NOSTRIL EVERY DAY    . glucosamine-chondroitin 500-400 MG tablet Take 1 tablet by mouth 3 (three) times daily.    .Marland Kitchenlevothyroxine (SYNTHROID, LEVOTHROID) 25 MCG tablet Take 25 mcg by mouth daily before breakfast.     . Magnesium 500 MG CAPS Take 1 capsule by mouth every morning.     . metFORMIN (GLUCOPHAGE-XR) 500 MG 24 hr tablet Take 500 mg by mouth daily with breakfast.     . pantoprazole (PROTONIX) 40 MG  tablet TAKE 1 TABLET BY MOUTH EVERY DAY    . pramipexole (MIRAPEX) 1 MG tablet Take 1 tablet by mouth 2 (two) times daily.      No current facility-administered medications for this visit.      Allergies:   Ambien [zolpidem tartrate]; Barley grass; Codeine; Tape; Valium [diazepam]; Ibuprofen; Simvastatin; and Wheat bran   Social History:  The patient  reports that she has never smoked. She has never used smokeless tobacco. She reports that she does not drink alcohol or use drugs.   Family History:   family history includes Breast cancer  in her sister; Breast cancer (age of onset: 10) in her father; Cancer (age of onset: 37) in her father; Heart disease in her maternal grandmother; Stroke in her mother.    Review of Systems: Review of Systems  Constitutional: Negative.   Respiratory: Negative.   Cardiovascular: Positive for chest pain.  Gastrointestinal: Negative.   Musculoskeletal: Negative.   Neurological: Negative.   Psychiatric/Behavioral: Negative.   All other systems reviewed and are negative.    PHYSICAL EXAM: VS:  BP 138/86 (BP Location: Right Arm, Patient Position: Sitting, Cuff Size: Normal)   Pulse 95   Ht 5' 8"  (1.727 m)   Wt 203 lb 8 oz (92.3 kg)   BMI 30.94 kg/m  , BMI Body mass index is 30.94 kg/m. GEN: Well nourished, well developed, in no acute distress  HEENT: normal  Neck: no JVD, carotid bruits, or masses Cardiac: RRR; no murmurs, rubs, or gallops,no edema  Respiratory:  clear to auscultation bilaterally, normal work of breathing GI: soft, nontender, nondistended, + BS MS: no deformity or atrophy  Skin: warm and dry, no rash Neuro:  Strength and sensation are intact Psych: euthymic mood, full affect   Recent Labs: 12/30/2017: Hemoglobin 12.9; Platelets 292    Lipid Panel No results found for: CHOL, HDL, LDLCALC, TRIG    Wt Readings from Last 3 Encounters:  03/01/18 203 lb 8 oz (92.3 kg)  02/04/18 206 lb (93.4 kg)  12/30/17 211 lb (95.7 kg)       ASSESSMENT AND PLAN:  Chest pain with moderate risk for cardiac etiology - Plan: EKG 12-Lead, NM Myocar Multi W/Spect W/Wall Motion / EF Etiology of recent chest pain vomiting symptoms unclear Unable to treadmill secondary to chronic back pain walks with a cane We have ordered pharmacological Myoview -recommend she discuss with primary care having right upper quadrant ultrasound to look at her gallbladder  Type 2 diabetes We have encouraged continued exercise, careful diet management in an effort to lose weight.  hemoglobin A1c  has been trending upwards She does report recent weight loss  Arthritis, back pain Unable to treadmill, walks with a cane  Disposition:   F/U as needed We'll call her with results for stress test   Total encounter time more than 45 minutes  Greater than 50% was spent in counseling and coordination of care with the patient    Orders Placed This Encounter  Procedures  . NM Myocar Multi W/Spect W/Wall Motion / EF  . EKG 12-Lead     Signed, Esmond Plants, M.D., Ph.D. 03/01/2018  [place], [place]

## 2018-03-01 ENCOUNTER — Encounter: Payer: Self-pay | Admitting: Cardiovascular Disease

## 2018-03-01 ENCOUNTER — Ambulatory Visit (INDEPENDENT_AMBULATORY_CARE_PROVIDER_SITE_OTHER): Payer: Medicare Other | Admitting: Cardiovascular Disease

## 2018-03-01 VITALS — BP 138/86 | HR 95 | Ht 68.0 in | Wt 203.5 lb

## 2018-03-01 DIAGNOSIS — R079 Chest pain, unspecified: Secondary | ICD-10-CM

## 2018-03-01 DIAGNOSIS — Z96649 Presence of unspecified artificial hip joint: Secondary | ICD-10-CM | POA: Diagnosis not present

## 2018-03-01 DIAGNOSIS — E118 Type 2 diabetes mellitus with unspecified complications: Secondary | ICD-10-CM

## 2018-03-01 DIAGNOSIS — Z794 Long term (current) use of insulin: Secondary | ICD-10-CM

## 2018-03-01 DIAGNOSIS — D509 Iron deficiency anemia, unspecified: Secondary | ICD-10-CM | POA: Diagnosis not present

## 2018-03-01 NOTE — Patient Instructions (Addendum)
Medication Instructions:   No medication changes made  Labwork:  No new labs needed  Testing/Procedures:  We will order a nuclear stress test, myoview/lexiscan For chest pain/stable angina [place] caregiver has ordered a Stress Test with nuclear imaging. The purpose of this test is to evaluate the blood supply to your heart muscle. This procedure is referred to as a "Non-Invasive Stress Test." This is because other than having an IV started in your vein, nothing is inserted or "invades" your body. Cardiac stress tests are done to find areas of poor blood flow to the heart by determining the extent of coronary artery disease (CAD). Some patients exercise on a treadmill, which naturally increases the blood flow to your heart, while others who are  unable to walk on a treadmill due to physical limitations have a pharmacologic/chemical stress agent called Lexiscan . This medicine will mimic walking on a treadmill by temporarily increasing your coronary blood flow.   Please note: these test may take anywhere between 2-4 hours to complete  PLEASE REPORT TO [facility] [place] AT THE FIRST DESK WILL DIRECT YOU WHERE TO GO  Date of Procedure:_____________________________________  Arrival Time for Procedure:______________________________  Instructions regarding medication:   _XX___ : Hold diabetes medication Metformin (Glucophage) the morning of procedure   PLEASE NOTIFY THE OFFICE AT LEAST 24 HOURS IN ADVANCE IF YOU ARE UNABLE TO [place].  [phone] AND  PLEASE NOTIFY NUCLEAR MEDICINE AT [facility] AT LEAST 24 HOURS IN ADVANCE IF YOU ARE UNABLE TO KEEP YOUR APPOINTMENT. [phone]  How to prepare for your Myoview test:  1. Do not eat or drink after midnight 2. No caffeine for 24 hours prior to test 3. No smoking 24 hours prior to test. 4. Your medication may be taken with water.  If your doctor stopped a medication because of this test, do  not take that medication. 5. Ladies, please do not wear dresses.  Skirts or pants are appropriate. Please wear a short sleeve shirt. 6. No perfume, cologne or lotion. 7. Wear comfortable walking shoes. No heels!      Follow-Up: It was a pleasure seeing you in the office today. Please call us if you have new issues that need to be addressed before your next appt.  [phone]  Your physician wants you to follow-up in:  As needed  If you need a refill on your cardiac medications before your next appointment, please call your pharmacy.  For educational health videos Log in to : [email or web address] Or : [email or web address], password : [email or web address]

## 2018-03-04 ENCOUNTER — Encounter
Admission: RE | Admit: 2018-03-04 | Discharge: 2018-03-04 | Disposition: A | Discharge status: Home / Self Care | Payer: Medicare Other | Source: Ambulatory Visit | Attending: Cardiovascular Disease | Admitting: Cardiovascular Disease

## 2018-03-04 DIAGNOSIS — R079 Chest pain, unspecified: Secondary | ICD-10-CM

## 2018-03-04 LAB — NM MYOCAR MULTI W/SPECT W/WALL MOTION / EF
CHL CUP MPHR: 148 {beats}/min
CHL CUP NUCLEAR SDS: 0
CSEPED: 0 min
Estimated workload: 1 METS
Exercise duration (sec): 0 s
LV dias vol: 75 mL (ref 46–106)
LV sys vol: 26 mL
NUC STRESS TID: 1.04
Peak HR: 97 {beats}/min
Percent HR: 65 %
Rest HR: 73 {beats}/min
SRS: 9
SSS: 1

## 2018-03-04 LAB — GLUCOSE, CAPILLARY: GLUCOSE-CAPILLARY: 171 mg/dL — AB (ref 70–99)

## 2018-03-04 MED ORDER — TECHNETIUM TC 99M TETROFOSMIN IV KIT
13.5520 | PACK | Freq: Once | INTRAVENOUS | Status: AC | PRN
  Administered 2018-03-04: 13.552 via INTRAVENOUS

## 2018-03-04 MED ORDER — REGADENOSON 0.4 MG/5ML IV SOLN
0.4000 mg | Freq: Once | INTRAVENOUS | Status: AC
  Administered 2018-03-04: 0.4 mg via INTRAVENOUS

## 2018-03-04 MED ORDER — TECHNETIUM TC 99M TETROFOSMIN IV KIT
32.7100 | PACK | Freq: Once | INTRAVENOUS | Status: AC | PRN
  Administered 2018-03-04: 32.71 via INTRAVENOUS

## 2018-03-04 NOTE — Progress Notes (Signed)
Pt. In Nuclear Med.-became shaky, diaphoretic, dizzy, pale and had small amt. Vomiting.  Laid down on stretcher and VS checked-see flow sheet. FSBS-171.  Cold wet cloth to neck and forehead.  Color improved.  Skin warmer and drier now.  VS improved.  Pt. States she is feeling better.  Still lying down for present.

## 2018-03-11 ENCOUNTER — Ambulatory Visit
Admission: RE | Admit: 2018-03-11 | Discharge: 2018-03-11 | Disposition: A | Discharge status: Home / Self Care | Payer: Medicare Other | Source: Ambulatory Visit | Attending: Neurosurgery | Admitting: Neurosurgery

## 2018-03-11 DIAGNOSIS — G8929 Other chronic pain: Secondary | ICD-10-CM

## 2018-03-11 DIAGNOSIS — M5442 Lumbago with sciatica, left side: Principal | ICD-10-CM

## 2018-03-11 MED ORDER — IOPAMIDOL (ISOVUE-M 200) INJECTION 41%
18.0000 mL | Freq: Once | INTRAMUSCULAR | Status: AC
  Administered 2018-03-11: 18 mL via INTRATHECAL

## 2018-03-11 MED ORDER — ONDANSETRON HCL 4 MG/2ML IJ SOLN: 4.0000 mg | Freq: Four times a day (QID) | INTRAMUSCULAR | Status: DC | PRN

## 2018-03-11 MED ORDER — ONDANSETRON HCL 4 MG/2ML IJ SOLN
4.0000 mg | Freq: Once | INTRAMUSCULAR | Status: AC
  Administered 2018-03-11: 4 mg via INTRAMUSCULAR

## 2018-03-11 MED ORDER — MEPERIDINE HCL 100 MG/ML IJ SOLN
75.0000 mg | Freq: Once | INTRAMUSCULAR | Status: AC
  Administered 2018-03-11: 75 mg via INTRAMUSCULAR

## 2018-03-11 NOTE — Discharge Instructions (Signed)

## 2018-04-24 NOTE — Progress Notes (Deleted)
[place]  Telephone:([phone] Fax:(336) [phone]  ID: DURA MCCORMACK OB: 06/03/1946  MR#: [id number]  QZR#:[id number]  Patient Care Team: Derinda Late, MD as PCP - General (Family Medicine) Marry Guan, Laurice Record, MD (Orthopedic Surgery)  CHIEF COMPLAINT: Iron deficiency anemia  INTERVAL HISTORY: Patient returns to clinic today for repeat laboratory work and further evaluation.  She continues to have chronic weakness and fatigue, but admits this has mildly improved.  She otherwise feels well and is asymptomatic. She has no neurologic complaints.  She denies any recent fevers or illnesses.  She has a good appetite and denies weight loss.  She has no chest pain or shortness of breath.  She denies any nausea, vomiting, constipation, or diarrhea.  She denies any melena or hematochezia.  She has no urinary complaints.  Patient offers no further specific complaints today.  REVIEW OF SYSTEMS:   Review of Systems  Constitutional: Positive for malaise/fatigue. Negative for fever and weight loss.  Respiratory: Negative.  Negative for cough, hemoptysis and shortness of breath.   Cardiovascular: Negative.  Negative for chest pain and leg swelling.  Gastrointestinal: Negative.  Negative for abdominal pain, blood in stool and melena.  Genitourinary: Negative.  Negative for hematuria.  Musculoskeletal: Negative.  Negative for myalgias.  Skin: Negative.  Negative for rash.  Neurological: Positive for weakness. Negative for sensory change, focal weakness and headaches.  Psychiatric/Behavioral: Negative.  The patient is not nervous/anxious.     As per HPI. Otherwise, a complete review of systems is negative.  PAST MEDICAL HISTORY: Past Medical History:  Diagnosis Date  . Anemia   . Arthritis   . Back pain   . Celiac disease   . Depression   . Diabetes mellitus without complication ([place])   . GERD (gastroesophageal reflux disease)   . Heart murmur   . MVP (mitral valve  prolapse)     PAST SURGICAL HISTORY: Past Surgical History:  Procedure Laterality Date  . ABDOMINAL HYSTERECTOMY    . BACK SURGERY     spinal fusion  . CATARACT EXTRACTION W/ INTRAOCULAR LENS  IMPLANT, BILATERAL Bilateral   . [place]  . COLONOSCOPY WITH PROPOFOL N/A 02/04/2018   Procedure: COLONOSCOPY WITH PROPOFOL;  Surgeon: Manya Silvas, MD;  Location: [facility] ENDOSCOPY;  Service: Endoscopy;  Laterality: N/A;  . ESOPHAGOGASTRODUODENOSCOPY (EGD) WITH PROPOFOL N/A 02/04/2018   Procedure: ESOPHAGOGASTRODUODENOSCOPY (EGD) WITH PROPOFOL;  Surgeon: Manya Silvas, MD;  Location: [facility] ENDOSCOPY;  Service: Endoscopy;  Laterality: N/A;  . FOOT SURGERY Right 2007  . HARDWARE REMOVAL Right 01/14/2015   Procedure: HARDWARE REMOVAL/ REMOVAL OF 3 CANNULATED SCREWS FROM RIGHT HIP.;  Surgeon: Dereck Leep, MD;  Location: [facility] ORS;  Service: Orthopedics;  Laterality: Right;  . HIP FRACTURE SURGERY Right 2016  . JOINT REPLACEMENT Bilateral 2014   knees  . NECK SURGERY     fusion  . TOTAL HIP ARTHROPLASTY Right 01/14/2015   Procedure: TOTAL HIP ARTHROPLASTY;  Surgeon: Dereck Leep, MD;  Location: [facility] ORS;  Service: Orthopedics;  Laterality: Right;    FAMILY HISTORY: Family History  Problem Relation Age of Onset  . Cancer Father 73       Colon Cancer  . Breast cancer Father 38  . Breast cancer Sister   . Stroke Mother   . Heart disease Maternal Grandmother     ADVANCED DIRECTIVES (Y/N):  N  HEALTH MAINTENANCE: Social History   Tobacco Use  . Smoking status: Never Smoker  .  Smokeless tobacco: Never Used  Substance Use Topics  . Alcohol use: No  . Drug use: No     Colonoscopy:  PAP:  Bone density:  Lipid panel:  Allergies  Allergen Reactions  . Ambien [Zolpidem Tartrate] Other (See Comments)    Altered mental status  . Barley Grass Other (See Comments)    Celiac disease  . Codeine Nausea Only  . Tape Other (See Comments)    Adhesive tape -  tears skin  . Valium [Diazepam] Other (See Comments)    Sleep walks  . Ibuprofen Other (See Comments) and Rash    GI upset " makes my stomach hurt"  . Simvastatin Rash  . Wheat Bran Rash    Celiac disease    Current Outpatient Medications  Medication Sig Dispense Refill  . amoxicillin (AMOXIL) 500 MG tablet Take by mouth. Take 544m by mouth prior to dental procedures    . Biotin 10000 MCG TABS Take by mouth.    . Calcium Carbonate-Vitamin D (CALCIUM 600 + D PO) Take 1 tablet by mouth 2 (two) times daily.     . fluticasone (FLONASE) 50 MCG/ACT nasal spray SHAKE LIQUID AND USE 2 SPRAYS IN EACH NOSTRIL EVERY DAY    . glucosamine-chondroitin 500-400 MG tablet Take 1 tablet by mouth 3 (three) times daily.    .Marland Kitchenlevothyroxine (SYNTHROID, LEVOTHROID) 25 MCG tablet Take 25 mcg by mouth daily before breakfast.     . Magnesium 500 MG CAPS Take 1 capsule by mouth every morning.     . metFORMIN (GLUCOPHAGE-XR) 500 MG 24 hr tablet Take 500 mg by mouth daily with breakfast.     . pantoprazole (PROTONIX) 40 MG tablet TAKE 1 TABLET BY MOUTH EVERY DAY    . pramipexole (MIRAPEX) 1 MG tablet Take 1 tablet by mouth 2 (two) times daily.      No current facility-administered medications for this visit.     OBJECTIVE: There were no vitals filed for this visit.   There is no height or weight on file to calculate BMI.    ECOG FS:0 - Asymptomatic  General: Well-developed, well-nourished, no acute distress. Eyes: Pink conjunctiva, anicteric sclera. Lungs: Clear to auscultation bilaterally. Heart: Regular rate and rhythm. No rubs, murmurs, or gallops. Abdomen: Soft, nontender, nondistended. No organomegaly noted, normoactive bowel sounds. Musculoskeletal: No edema, cyanosis, or clubbing. Neuro: Alert, answering all questions appropriately. Cranial nerves grossly intact. Skin: No rashes or petechiae noted. Psych: Normal affect.  LAB RESULTS:  Lab Results  Component Value Date   NA 137 12/10/2016   K  3.8 12/10/2016   CL 104 12/10/2016   CO2 26 12/10/2016   GLUCOSE 164 (H) 12/10/2016   BUN 12 12/10/2016   CREATININE 0.71 12/10/2016   CALCIUM 9.1 12/10/2016   PROT 7.6 12/10/2016   ALBUMIN 3.6 12/10/2016   AST 25 12/10/2016   ALT 16 12/10/2016   ALKPHOS 83 12/10/2016   BILITOT 0.4 12/10/2016   GFRNONAA >60 12/10/2016   GFRAA >60 12/10/2016    Lab Results  Component Value Date   WBC 5.7 12/30/2017   NEUTROABS 3.3 12/30/2017   HGB 12.9 12/30/2017   HCT 37.8 12/30/2017   MCV 91.3 12/30/2017   PLT 292 12/30/2017   Lab Results  Component Value Date   IRON 51 12/30/2017   TIBC 284 12/30/2017   IRONPCTSAT 18 12/30/2017   Lab Results  Component Value Date   FERRITIN 68 12/30/2017     STUDIES: No results found.  ASSESSMENT: Iron  deficiency anemia  PLAN:    1. Iron deficiency anemia: Patient's hemoglobin and iron stores continue to be within normal limits.  Previously the remainder of her laboratory work was also either negative or within normal limits.  She does not require additional IV iron today. She last received IV Venofer in January 2019.  Return to clinic in 4 months with repeat laboratory work and further evaluation.   2. Celiac disease: Continue monitoring and evaluation per GI.  Patient patient states because of this she has difficulty absorbing oral iron supplementation.  I spent a total of 20 minutes face-to-face with the patient of which greater than 50% of the visit was spent in counseling and coordination of care as detailed above.   Patient expressed understanding and was in agreement with this plan. She also understands that She can call clinic at any time with any questions, concerns, or complaints.    Lloyd Huger, MD   04/24/2018 10:07 PM

## 2018-04-26 ENCOUNTER — Other Ambulatory Visit: Payer: Medicare Other

## 2018-04-26 ENCOUNTER — Ambulatory Visit: Payer: Medicare Other | Admitting: Oncology

## 2018-04-26 ENCOUNTER — Ambulatory Visit: Payer: Self-pay

## 2018-05-15 NOTE — Progress Notes (Signed)
[place]  Telephone:([phone]) [phone] Fax:(336) [phone]  ID: Valerie Duran OB: 09/16/1945  MR#: [id number]  IFO#:[id number]  Patient Care Team: Derinda Late, MD as PCP - General (Family Medicine) Marry Guan, Laurice Record, MD (Orthopedic Surgery)  CHIEF COMPLAINT: Iron deficiency anemia  INTERVAL HISTORY: Patient returns to clinic today for repeat laboratory work and further evaluation.  She has noticed worsening fatigue over the past several weeks, but otherwise feels well.  She has no neurologic complaints.  She denies any recent fevers or illnesses.  She has a good appetite and denies weight loss.  She has no chest pain or shortness of breath.  She denies any nausea, vomiting, constipation, or diarrhea.  She denies any melena or hematochezia.  She has no urinary complaints.  Patient offers no further specific complaints today.  REVIEW OF SYSTEMS:   Review of Systems  Constitutional: Positive for malaise/fatigue. Negative for fever and weight loss.  Respiratory: Negative.  Negative for cough, hemoptysis and shortness of breath.   Cardiovascular: Negative.  Negative for chest pain and leg swelling.  Gastrointestinal: Negative.  Negative for abdominal pain, blood in stool and melena.  Genitourinary: Negative.  Negative for hematuria.  Musculoskeletal: Negative.  Negative for back pain and myalgias.  Skin: Negative.  Negative for rash.  Neurological: Negative.  Negative for sensory change, focal weakness, weakness and headaches.  Psychiatric/Behavioral: Negative.  The patient is not nervous/anxious.     As per HPI. Otherwise, a complete review of systems is negative.  PAST MEDICAL HISTORY: Past Medical History:  Diagnosis Date  . Anemia   . Arthritis   . Back pain   . Celiac disease   . Depression   . Diabetes mellitus without complication ([place])   . GERD (gastroesophageal reflux disease)   . Heart murmur   . MVP (mitral valve prolapse)     PAST SURGICAL  HISTORY: Past Surgical History:  Procedure Laterality Date  . ABDOMINAL HYSTERECTOMY    . BACK SURGERY     spinal fusion  . CATARACT EXTRACTION W/ INTRAOCULAR LENS  IMPLANT, BILATERAL Bilateral   . [place]  . COLONOSCOPY WITH PROPOFOL N/A 02/04/2018   Procedure: COLONOSCOPY WITH PROPOFOL;  Surgeon: Manya Silvas, MD;  Location: [facility] ENDOSCOPY;  Service: Endoscopy;  Laterality: N/A;  . ESOPHAGOGASTRODUODENOSCOPY (EGD) WITH PROPOFOL N/A 02/04/2018   Procedure: ESOPHAGOGASTRODUODENOSCOPY (EGD) WITH PROPOFOL;  Surgeon: Manya Silvas, MD;  Location: [facility] ENDOSCOPY;  Service: Endoscopy;  Laterality: N/A;  . FOOT SURGERY Right 2007  . HARDWARE REMOVAL Right 01/14/2015   Procedure: HARDWARE REMOVAL/ REMOVAL OF 3 CANNULATED SCREWS FROM RIGHT HIP.;  Surgeon: Dereck Leep, MD;  Location: [facility] ORS;  Service: Orthopedics;  Laterality: Right;  . HIP FRACTURE SURGERY Right 2016  . JOINT REPLACEMENT Bilateral 2014   knees  . NECK SURGERY     fusion  . TOTAL HIP ARTHROPLASTY Right 01/14/2015   Procedure: TOTAL HIP ARTHROPLASTY;  Surgeon: Dereck Leep, MD;  Location: [facility] ORS;  Service: Orthopedics;  Laterality: Right;    FAMILY HISTORY: Family History  Problem Relation Age of Onset  . Cancer Father 71       Colon Cancer  . Breast cancer Father 76  . Breast cancer Sister   . Stroke Mother   . Heart disease Maternal Grandmother     ADVANCED DIRECTIVES (Y/N):  N  HEALTH MAINTENANCE: Social History   Tobacco Use  . Smoking status: Never Smoker  . Smokeless tobacco: Never  Used  Substance Use Topics  . Alcohol use: No  . Drug use: No     Colonoscopy:  PAP:  Bone density:  Lipid panel:  Allergies  Allergen Reactions  . Ambien [Zolpidem Tartrate] Other (See Comments)    Altered mental status  . Barley Grass Other (See Comments)    Celiac disease  . Codeine Nausea Only  . Tape Other (See Comments)    Adhesive tape - tears skin  . Valium [Diazepam]  Other (See Comments)    Sleep walks  . Ibuprofen Other (See Comments) and Rash    GI upset " makes my stomach hurt"  . Simvastatin Rash  . Wheat Bran Rash    Celiac disease    Current Outpatient Medications  Medication Sig Dispense Refill  . amoxicillin (AMOXIL) 500 MG tablet Take by mouth. Take 550m by mouth prior to dental procedures    . Biotin 10000 MCG TABS Take by mouth.    . Calcium Carbonate-Vitamin D (CALCIUM 600 + D PO) Take 1 tablet by mouth 2 (two) times daily.     . fluticasone (FLONASE) 50 MCG/ACT nasal spray SHAKE LIQUID AND USE 2 SPRAYS IN EACH NOSTRIL EVERY DAY    . glucosamine-chondroitin 500-400 MG tablet Take 1 tablet by mouth 3 (three) times daily.    .Marland Kitchenlevothyroxine (SYNTHROID, LEVOTHROID) 25 MCG tablet Take 25 mcg by mouth daily before breakfast.     . Magnesium 500 MG CAPS Take 1 capsule by mouth every morning.     . metFORMIN (GLUCOPHAGE-XR) 500 MG 24 hr tablet Take 500 mg by mouth daily with breakfast.     . pantoprazole (PROTONIX) 40 MG tablet TAKE 1 TABLET BY MOUTH EVERY DAY    . pramipexole (MIRAPEX) 1 MG tablet Take 1 tablet by mouth 2 (two) times daily.      No current facility-administered medications for this visit.     OBJECTIVE: Vitals:   05/17/18 1045  BP: (!) 155/93  Pulse: 80  Resp: 18  Temp: 97.9 F (36.6 C)     Body mass index is 30.64 kg/m.    ECOG FS:0 - Asymptomatic  General: Well-developed, well-nourished, no acute distress. Eyes: Pink conjunctiva, anicteric sclera. HEENT: Normocephalic, moist mucous membranes. Lungs: Clear to auscultation bilaterally. Heart: Regular rate and rhythm. No rubs, murmurs, or gallops. Abdomen: Soft, nontender, nondistended. No organomegaly noted, normoactive bowel sounds. Musculoskeletal: No edema, cyanosis, or clubbing. Neuro: Alert, answering all questions appropriately. Cranial nerves grossly intact. Skin: No rashes or petechiae noted. Psych: Normal affect.  LAB RESULTS:  Lab Results   Component Value Date   NA 137 12/10/2016   K 3.8 12/10/2016   CL 104 12/10/2016   CO2 26 12/10/2016   GLUCOSE 164 (H) 12/10/2016   BUN 12 12/10/2016   CREATININE 0.71 12/10/2016   CALCIUM 9.1 12/10/2016   PROT 7.6 12/10/2016   ALBUMIN 3.6 12/10/2016   AST 25 12/10/2016   ALT 16 12/10/2016   ALKPHOS 83 12/10/2016   BILITOT 0.4 12/10/2016   GFRNONAA >60 12/10/2016   GFRAA >60 12/10/2016    Lab Results  Component Value Date   WBC 5.4 05/17/2018   NEUTROABS 3.3 05/17/2018   HGB 12.7 05/17/2018   HCT 39.0 05/17/2018   MCV 89.2 05/17/2018   PLT 297 05/17/2018   Lab Results  Component Value Date   IRON 41 05/17/2018   TIBC 267 05/17/2018   IRONPCTSAT 15 05/17/2018   Lab Results  Component Value Date   FERRITIN 60  05/17/2018     STUDIES: No results found.  ASSESSMENT: Iron deficiency anemia  PLAN:    1. Iron deficiency anemia: Patient's hemoglobin and iron stores continue to be within normal limits.  Previously, the remainder of her laboratory work was also either negative or within normal limits.  She does not require additional IV iron today. She last received IV Venofer in January 2019.  Return to clinic in 4 months with repeat laboratory work and further evaluation.  If patient's hemoglobin and iron stores continue to be within normal limits, she likely can be discharged from clinic.   2. Celiac disease: Continue monitoring and evaluation per GI.  Patient patient states because of this she has difficulty absorbing oral iron supplementation.  I spent a total of 20 minutes face-to-face with the patient of which greater than 50% of the visit was spent in counseling and coordination of care as detailed above.  Patient expressed understanding and was in agreement with this plan. She also understands that She can call clinic at any time with any questions, concerns, or complaints.    Lloyd Huger, MD   05/22/2018 10:46 AM

## 2018-05-17 ENCOUNTER — Inpatient Hospital Stay: Payer: Medicare Other | Attending: Oncology

## 2018-05-17 ENCOUNTER — Inpatient Hospital Stay: Payer: Medicare Other

## 2018-05-17 ENCOUNTER — Inpatient Hospital Stay (HOSPITAL_BASED_OUTPATIENT_CLINIC_OR_DEPARTMENT_OTHER): Payer: Medicare Other | Admitting: Oncology

## 2018-05-17 VITALS — BP 155/93 | HR 80 | Temp 97.9°F | Resp 18 | Wt 201.5 lb

## 2018-05-17 DIAGNOSIS — K900 Celiac disease: Secondary | ICD-10-CM | POA: Insufficient documentation

## 2018-05-17 DIAGNOSIS — D509 Iron deficiency anemia, unspecified: Secondary | ICD-10-CM

## 2018-05-17 LAB — FERRITIN: Ferritin: 60 ng/mL (ref 11–307)

## 2018-05-17 LAB — CBC WITH DIFFERENTIAL/PLATELET
ABS IMMATURE GRANULOCYTES: 0.01 10*3/uL (ref 0.00–0.07)
BASOS ABS: 0.1 10*3/uL (ref 0.0–0.1)
Basophils Relative: 2 %
Eosinophils Absolute: 0.1 10*3/uL (ref 0.0–0.5)
Eosinophils Relative: 2 %
HEMATOCRIT: 39 % (ref 36.0–46.0)
HEMOGLOBIN: 12.7 g/dL (ref 12.0–15.0)
IMMATURE GRANULOCYTES: 0 %
LYMPHS PCT: 28 %
Lymphs Abs: 1.5 10*3/uL (ref 0.7–4.0)
MCH: 29.1 pg (ref 26.0–34.0)
MCHC: 32.6 g/dL (ref 30.0–36.0)
MCV: 89.2 fL (ref 80.0–100.0)
MONOS PCT: 7 %
Monocytes Absolute: 0.4 10*3/uL (ref 0.1–1.0)
NEUTROS PCT: 61 %
NRBC: 0 % (ref 0.0–0.2)
Neutro Abs: 3.3 10*3/uL (ref 1.7–7.7)
Platelets: 297 10*3/uL (ref 150–400)
RBC: 4.37 MIL/uL (ref 3.87–5.11)
RDW: 12.2 % (ref 11.5–15.5)
WBC: 5.4 10*3/uL (ref 4.0–10.5)

## 2018-05-17 LAB — IRON AND TIBC
IRON: 41 ug/dL (ref 28–170)
Saturation Ratios: 15 % (ref 10.4–31.8)
TIBC: 267 ug/dL (ref 250–450)
UIBC: 226 ug/dL

## 2018-05-17 NOTE — Progress Notes (Unsigned)
No Venofer today.

## 2018-05-17 NOTE — Progress Notes (Signed)
Patient here today for follow up regarding anemia. Patient reports worsening fatigue over the past few weeks.

## 2018-06-01 ENCOUNTER — Other Ambulatory Visit: Payer: Self-pay | Admitting: Neurosurgery

## 2018-06-24 ENCOUNTER — Other Ambulatory Visit: Payer: Self-pay | Admitting: Neurosurgery

## 2018-06-28 NOTE — Pre-Procedure Instructions (Signed)
Valerie Duran  06/28/2018      [id number] [address], [address] [place] [address] Phone: [phone] Fax: [phone]    Your procedure is scheduled on Wednesday, January 15th.  Report to [facility] Admitting at 6:30 A.M.  Call this number if you have problems the morning of surgery:  [phone]   Remember:  Do not eat or drink after midnight.    Take these medicines the morning of surgery with A SIP OF WATER: fexofenadine (ALLEGRA) fluticasone (FLONASE) levothyroxine (SYNTHROID, LEVOTHROID) pramipexole (MIRAPEX)   As of today, STOP taking any Aspirin (unless otherwise instructed by your surgeon), Aleve, Naproxen, Ibuprofen, Motrin, Advil, Goody's, BC's, all herbal medications, fish oil, and all vitamins.   WHAT DO I DO ABOUT MY DIABETES MEDICATION?   Marland Kitchen Do not take oral diabetes medicines (pills) the morning of surgery. Do not take your metFORMIN (GLUCOPHAGE-XR).    How to Manage Your Diabetes Before and After Surgery  Why is it important to control my blood sugar before and after surgery? . Improving blood sugar levels before and after surgery helps healing and can limit problems. . A way of improving blood sugar control is eating a healthy diet by: o  Eating less sugar and carbohydrates o  Increasing activity/exercise o  Talking with your doctor about reaching your blood sugar goals . High blood sugars (greater than 180 mg/dL) can raise your risk of infections and slow your recovery, so you will need to focus on controlling your diabetes during the weeks before surgery. . Make sure that the doctor who takes care of your diabetes knows about your planned surgery including the date and location.  How do I manage my blood sugar before surgery? . Check your blood sugar at least 4 times a day, starting 2 days before surgery, to make sure that the level is not  too high or low. o Check your blood sugar the morning of your surgery when you wake up and every 2 hours until you get to the Short Stay unit. . If your blood sugar is less than 70 mg/dL, you will need to treat for low blood sugar: o Do not take insulin. o Treat a low blood sugar (less than 70 mg/dL) with  cup of clear juice (cranberry or apple), 4 glucose tablets, OR glucose gel. o Recheck blood sugar in 15 minutes after treatment (to make sure it is greater than 70 mg/dL). If your blood sugar is not greater than 70 mg/dL on recheck, call [phone] for further instructions. . Report your blood sugar to the short stay nurse when you get to Short Stay.  . If you are admitted to the hospital after surgery: o Your blood sugar will be checked by the staff and you will probably be given insulin after surgery (instead of oral diabetes medicines) to make sure you have good blood sugar levels. o The goal for blood sugar control after surgery is 80-180 mg/dL.    Do not wear jewelry, make-up or nail polish.  Do not wear lotions, powders, or perfumes, or deodorant.  Do not shave 48 hours prior to surgery.    Do not bring valuables to the hospital.  [facility] is not responsible for any belongings or valuables.  Contacts, dentures or bridgework may not be worn into surgery.  Leave your suitcase in the car.  After surgery  it may be brought to your room.  For patients admitted to the hospital, discharge time will be determined by your treatment team.  Patients discharged the day of surgery will not be allowed to drive home.   Special instructions:   [place]- Preparing For Surgery  Before surgery, you can play an important role. Because skin is not sterile, your skin needs to be as free of germs as possible. You can reduce the number of germs on your skin by washing with CHG (chlorahexidine gluconate) Soap before surgery.  CHG is an antiseptic cleaner which kills germs and bonds with the skin to  continue killing germs even after washing.    Oral Hygiene is also important to reduce your risk of infection.  Remember - BRUSH YOUR TEETH THE MORNING OF SURGERY WITH YOUR REGULAR TOOTHPASTE  Please do not use if you have an allergy to CHG or antibacterial soaps. If your skin becomes reddened/irritated stop using the CHG.  Do not shave (including legs and underarms) for at least 48 hours prior to first CHG shower. It is OK to shave your face.  Please follow these instructions carefully.   1. Shower the NIGHT BEFORE SURGERY and the MORNING OF SURGERY with CHG.   2. If you chose to wash your hair, wash your hair first as usual with your normal shampoo.  3. After you shampoo, rinse your hair and body thoroughly to remove the shampoo.  4. Use CHG as you would any other liquid soap. You can apply CHG directly to the skin and wash gently with a scrungie or a clean washcloth.   5. Apply the CHG Soap to your body ONLY FROM THE NECK DOWN.  Do not use on open wounds or open sores. Avoid contact with your eyes, ears, mouth and genitals (private parts). Wash Face and genitals (private parts)  with your normal soap.  6. Wash thoroughly, paying special attention to the area where your surgery will be performed.  7. Thoroughly rinse your body with warm water from the neck down.  8. DO NOT shower/wash with your normal soap after using and rinsing off the CHG Soap.  9. Pat yourself dry with a CLEAN TOWEL.  10. Wear CLEAN PAJAMAS to bed the night before surgery, wear comfortable clothes the morning of surgery  11. Place CLEAN SHEETS on your bed the night of your first shower and DO NOT SLEEP WITH PETS.    Day of Surgery:  Do not apply any deodorants/lotions.  Please wear clean clothes to the hospital/surgery center.   Remember to brush your teeth WITH YOUR REGULAR TOOTHPASTE.   Please read over the following fact sheets that you were given.

## 2018-06-29 ENCOUNTER — Telehealth: Payer: Self-pay | Admitting: Cardiovascular Disease

## 2018-06-29 ENCOUNTER — Encounter (HOSPITAL_COMMUNITY)
Admission: RE | Admit: 2018-06-29 | Discharge: 2018-06-29 | Disposition: A | Discharge status: Home / Self Care | Payer: Medicare Other | Source: Ambulatory Visit | Attending: Neurosurgery | Admitting: Neurosurgery

## 2018-06-29 ENCOUNTER — Encounter (HOSPITAL_COMMUNITY): Payer: Self-pay

## 2018-06-29 DIAGNOSIS — Z01812 Encounter for preprocedural laboratory examination: Secondary | ICD-10-CM | POA: Insufficient documentation

## 2018-06-29 HISTORY — DX: Restless legs syndrome: G25.81

## 2018-06-29 LAB — BASIC METABOLIC PANEL
Anion gap: 8 (ref 5–15)
BUN: 8 mg/dL (ref 8–23)
CO2: 25 mmol/L (ref 22–32)
CREATININE: 0.94 mg/dL (ref 0.44–1.00)
Calcium: 9 mg/dL (ref 8.9–10.3)
Chloride: 102 mmol/L (ref 98–111)
GFR calc Af Amer: >60 mL/min (ref >60)
GFR calc non Af Amer: >60 mL/min (ref >60)
GLUCOSE: 170 mg/dL — AB (ref 70–99)
POTASSIUM: 3.4 mmol/L — AB (ref 3.5–5.1)
Sodium: 135 mmol/L (ref 135–145)

## 2018-06-29 LAB — CBC
HCT: 40.1 % (ref 36.0–46.0)
Hemoglobin: 13.1 g/dL (ref 12.0–15.0)
MCH: 30.1 pg (ref 26.0–34.0)
MCHC: 32.7 g/dL (ref 30.0–36.0)
MCV: 92.2 fL (ref 80.0–100.0)
PLATELETS: 321 10*3/uL (ref 150–400)
RBC: 4.35 MIL/uL (ref 3.87–5.11)
RDW: 12.5 % (ref 11.5–15.5)
WBC: 6 10*3/uL (ref 4.0–10.5)
nRBC: 0 % (ref 0.0–0.2)

## 2018-06-29 LAB — HEMOGLOBIN A1C
Hgb A1c MFr Bld: 6.9 % — ABNORMAL HIGH (ref 4.8–5.6)
MEAN PLASMA GLUCOSE: 151.33 mg/dL

## 2018-06-29 LAB — TYPE AND SCREEN
ABO/RH(D): A NEG
Antibody Screen: NEGATIVE

## 2018-06-29 LAB — SURGICAL PCR SCREEN
MRSA, PCR: NEGATIVE
Staphylococcus aureus: NEGATIVE

## 2018-06-29 LAB — GLUCOSE, CAPILLARY: GLUCOSE-CAPILLARY: 177 mg/dL — AB (ref 70–99)

## 2018-06-29 MED ORDER — CHLORHEXIDINE GLUCONATE CLOTH 2 % EX PADS: 6.0000 | MEDICATED_PAD | Freq: Once | CUTANEOUS | Status: DC

## 2018-06-29 NOTE — Telephone Encounter (Signed)
Pre Op calling - did not receive fax from office - Please fax clearance to [name] at pre op      [place] Pre-operative Risk Assessment    Request for surgical clearance:  1. What type of surgery is being performed?  posterior lumbar interbody fusion recovery  2. When is this surgery scheduled?  1/15  3. What type of clearance is required (medical clearance vs. Pharmacy clearance to hold med vs. Both)? Medical   4. Are there any medications that need to be held prior to surgery and how long?    5. Practice name and name of physician performing surgery? Dr Arnoldo Morale, [facility]     6. What is your office phone number [phone]    7.   What is your office fax number [phone]  8.   Anesthesia type (None, local, MAC, general) ?  Does not know    Valerie Duran 06/29/2018, 2:24 PM  _________________________________________________________________   (provider comments below)

## 2018-06-29 NOTE — Progress Notes (Signed)
Dr Rockey Situ faxed for cardiac clearance.

## 2018-06-29 NOTE — Telephone Encounter (Signed)
Low, acceptable risk for procedure No further testing needed Just had low risk stress test

## 2018-06-30 NOTE — Telephone Encounter (Signed)
Routed to number provided via EPIC fax.

## 2018-06-30 NOTE — Progress Notes (Addendum)
Anesthesia Chart Review:  Case:  [id number] Date/Time:  07/06/18 0815   Procedures:      POSTERIOR LUMBAR INTERBODY FUSION, INTERBODY PROSTHESIS, LUMBAR 1- LUMBAR 2, LUMBAR 2- LUMBAR 3, LUMBAR 5- SACRAL 1, INSTRUMENTATION AND FUSION THORACIC 10 TO ILIUM (N/A ) - POSTERIOR LUMBAR INTERBODY FUSION, INTERBODY PROSTHESIS, LUMBAR 1- LUMBAR 2, LUMBAR 2- LUMBAR 3, LUMBAR 5- SACRAL 1, INSTRUMENTATION AND FUSION THORACIC 10 TO ILIUM     APPLICATION OF INTRAOPERATIVE CT SCAN (N/A )   Anesthesia type:  General   Pre-op diagnosis:  SPINAL STENOSIS, LUMBAR REGION WITH NEUROGENIC CLAUDICATION   Location:  [place] OR ROOM 21 / [place] OR   Surgeon:  Newman Pies, MD      DISCUSSION: Patient is a 73 year old female scheduled for the above procedure.  History includes never smoker, DM2, HTN, murmur (MVP), GERD, celiac disease, anemia, C5-7 ACDF 06/03/06, L3-5 PLIF 11/22/08.   Per cardiologist Dr. Rockey Situ (06/29/18):  "Low, acceptable risk for procedure No further testing needed Just had low risk stress test"  Labs acceptable. A1c < 7. If no acute changes then I anticipate that she can proceed as planned.    VS: BP (!) 172/68   Pulse 86   Temp 36.9 C   Resp 20   Ht 5' 8"  (1.727 m)   Wt 88.9 kg   SpO2 100%   BMI 29.80 kg/m   PROVIDERS: Derinda Late, MD is PCP Ida Rogue, MD is cardiologist. Seen on 03/01/17 for evaluation of chest pain. PRN cardiology follow-up anticipated pending stress test result (which was considered low risk).   LABS: Labs reviewed: Acceptable for surgery. (all labs ordered are listed, but only abnormal results are displayed)  Labs Reviewed  GLUCOSE, CAPILLARY - Abnormal; Notable for the following components:      Result Value   Glucose-Capillary 177 (*)    All other components within normal limits  BASIC METABOLIC PANEL - Abnormal; Notable for the following components:   Potassium 3.4 (*)    Glucose, Bld 170 (*)    All other components within normal limits  HEMOGLOBIN  A1C - Abnormal; Notable for the following components:   Hgb A1c MFr Bld 6.9 (*)    All other components within normal limits  SURGICAL PCR SCREEN  CBC  TYPE AND SCREEN    IMAGES: CT L-spine 03/11/18: IMPRESSION: - L1-2: Broad-based disc herniation with caudal migration on the right. Facet and ligamentous hypertrophy. Stenosis of the lateral recesses right more than left. Mild right foraminal narrowing. This has worsened since the previous MRI. - L2-3: Normal circumferential disc protrusion, focally prominent in the left foraminal to extraforaminal region. Facet and ligamentous hypertrophy. Stenosis of both lateral recesses left worse than right. Left foraminal to extraforaminal encroachment likely to compress the left L2 nerve. - L3 through L5: Wide patency of the canal and foramina. Solid union. No hardware complication. - B2-W4: Endplate osteophytes and shallow protrusion of the disc more towards the left. Facet and ligamentous hypertrophy. Stenosis of the subarticular lateral recesses slightly worse on the left. This could affect either S1 nerve, more likely the left.   EKG: 03/01/18: NSR, LAD. Minimal voltage criteria for LVH, may be normal variant. Cannot rule out anterior infarct (age undetermined).   CV: Nuclear stress test 03/04/18: Pharmacological myocardial perfusion imaging study with no significant  ischemia Small region of mild fixed perfusion defect in the distal inferolateral and apical inferolateral wall, unable to exclude attenuation artifact Minimal defect noted on non-attenuation corrected images  Normal wall motion, EF estimated at 68% No EKG changes concerning for ischemia at peak stress or in recovery. Low risk scan (Dr. Rockey Situ did not recommend any further heart testing at that time. Consider gallbladder disease as cause of symptoms.)   Past Medical History:  Diagnosis Date  . Anemia   . Arthritis   . Back pain   . Celiac disease   . Depression   .  Diabetes mellitus without complication ([place])   . GERD (gastroesophageal reflux disease)   . Heart murmur   . MVP (mitral valve prolapse)   . RLS (restless legs syndrome)     Past Surgical History:  Procedure Laterality Date  . ABDOMINAL HYSTERECTOMY    . BACK SURGERY     spinal fusion  . CATARACT EXTRACTION W/ INTRAOCULAR LENS  IMPLANT, BILATERAL Bilateral   . [place]  . COLONOSCOPY WITH PROPOFOL N/A 02/04/2018   Procedure: COLONOSCOPY WITH PROPOFOL;  Surgeon: Manya Silvas, MD;  Location: [facility] ENDOSCOPY;  Service: Endoscopy;  Laterality: N/A;  . ESOPHAGOGASTRODUODENOSCOPY (EGD) WITH PROPOFOL N/A 02/04/2018   Procedure: ESOPHAGOGASTRODUODENOSCOPY (EGD) WITH PROPOFOL;  Surgeon: Manya Silvas, MD;  Location: [facility] ENDOSCOPY;  Service: Endoscopy;  Laterality: N/A;  . FOOT SURGERY Right 2007  . HARDWARE REMOVAL Right 01/14/2015   Procedure: HARDWARE REMOVAL/ REMOVAL OF 3 CANNULATED SCREWS FROM RIGHT HIP.;  Surgeon: Dereck Leep, MD;  Location: [facility] ORS;  Service: Orthopedics;  Laterality: Right;  . HIP FRACTURE SURGERY Right 2016  . JOINT REPLACEMENT Bilateral 2014   knees  . NECK SURGERY     fusion  . TOTAL HIP ARTHROPLASTY Right 01/14/2015   Procedure: TOTAL HIP ARTHROPLASTY;  Surgeon: Dereck Leep, MD;  Location: [facility] ORS;  Service: Orthopedics;  Laterality: Right;    MEDICATIONS: . amoxicillin (AMOXIL) 500 MG tablet  . Biotin 10000 MCG TABS  . fexofenadine (ALLEGRA) 180 MG tablet  . fluticasone (FLONASE) 50 MCG/ACT nasal spray  . Glucosamine-Chondroitin 750-600 MG TABS  . levothyroxine (SYNTHROID, LEVOTHROID) 50 MCG tablet  . Magnesium 500 MG CAPS  . metFORMIN (GLUCOPHAGE-XR) 500 MG 24 hr tablet  . pramipexole (MIRAPEX) 1 MG tablet   No current facility-administered medications for this encounter.     Myra Gianotti, PA-C Surgical Short Stay/Anesthesiology [facility] Phone [phone] [facility] Phone [phone] 06/30/2018 1:08 PM

## 2018-06-30 NOTE — Anesthesia Preprocedure Evaluation (Addendum)
Anesthesia Evaluation  Patient identified by MRN, date of birth, ID band Patient awake    Reviewed: Allergy & Precautions, NPO status , Patient's Chart, lab work & pertinent test results  History of Anesthesia Complications Negative for: history of anesthetic complications  Airway Mallampati: III  TM Distance: >3 FB Neck ROM: Full    Dental no notable dental hx. (+) Chipped, Dental Advisory Given, Poor Dentition,  Overall poor dentition with multiple chipped teeth; per patient no loose teeth:   Pulmonary neg pulmonary ROS,    Pulmonary exam normal        Cardiovascular Normal cardiovascular exam+ Valvular Problems/Murmurs MVP      Neuro/Psych negative neurological ROS  negative psych ROS   GI/Hepatic Neg liver ROS, GERD  ,Celiac disease   Endo/Other  diabetesHypothyroidism   Renal/GU negative Renal ROS  negative genitourinary   Musculoskeletal negative musculoskeletal ROS (+)   Abdominal   Peds  Hematology  (+) anemia , Gets regular iron infusions   Anesthesia Other Findings 73 yo F for T10-S1 - PMH: MVP, celiac disease, GERD, DM, hypothyroid  Reproductive/Obstetrics                           Anesthesia Physical Anesthesia Plan  ASA: II  Anesthesia Plan: General   Post-op Pain Management:    Induction: Intravenous  PONV Risk Score and Plan: 3 and Ondansetron, Dexamethasone and Treatment may vary due to age or medical condition  Airway Management Planned: Oral ETT  Additional Equipment: Arterial line  Intra-op Plan:   Post-operative Plan: Extubation in OR  Informed Consent: I have reviewed the patients History and Physical, chart, labs and discussed the procedure including the risks, benefits and alternatives for the proposed anesthesia with the patient or authorized representative who has indicated his/her understanding and acceptance.     Dental advisory given  Plan  Discussed with:   Anesthesia Plan Comments: (PAT note written 06/30/2018 by Myra Gianotti, PA-C. )      Anesthesia Quick Evaluation

## 2018-07-06 ENCOUNTER — Encounter (HOSPITAL_COMMUNITY): Payer: Self-pay | Admitting: *Deleted

## 2018-07-06 ENCOUNTER — Inpatient Hospital Stay (HOSPITAL_COMMUNITY): Payer: Medicare Other | Admitting: Anesthesiology

## 2018-07-06 ENCOUNTER — Inpatient Hospital Stay (HOSPITAL_COMMUNITY): Payer: Medicare Other | Admitting: Vascular Surgery

## 2018-07-06 ENCOUNTER — Inpatient Hospital Stay (HOSPITAL_COMMUNITY)
Admission: RE | Disposition: A | Discharge status: Home Health | Payer: Self-pay | Source: Home / Self Care | Attending: Neurosurgery

## 2018-07-06 ENCOUNTER — Inpatient Hospital Stay (HOSPITAL_COMMUNITY): Payer: Medicare Other

## 2018-07-06 ENCOUNTER — Inpatient Hospital Stay (HOSPITAL_COMMUNITY)
Admission: RE | Admit: 2018-07-06 | Discharge: 2018-07-09 | DRG: 454 | Disposition: A | Discharge status: Home Health | Payer: Medicare Other | Attending: Neurosurgery | Admitting: Neurosurgery

## 2018-07-06 DIAGNOSIS — M5116 Intervertebral disc disorders with radiculopathy, lumbar region: Secondary | ICD-10-CM | POA: Diagnosis present

## 2018-07-06 DIAGNOSIS — Z823 Family history of stroke: Secondary | ICD-10-CM | POA: Diagnosis not present

## 2018-07-06 DIAGNOSIS — D62 Acute posthemorrhagic anemia: Secondary | ICD-10-CM | POA: Diagnosis not present

## 2018-07-06 DIAGNOSIS — E039 Hypothyroidism, unspecified: Secondary | ICD-10-CM | POA: Diagnosis present

## 2018-07-06 DIAGNOSIS — Z803 Family history of malignant neoplasm of breast: Secondary | ICD-10-CM

## 2018-07-06 DIAGNOSIS — Z7984 Long term (current) use of oral hypoglycemic drugs: Secondary | ICD-10-CM

## 2018-07-06 DIAGNOSIS — M5117 Intervertebral disc disorders with radiculopathy, lumbosacral region: Secondary | ICD-10-CM | POA: Diagnosis present

## 2018-07-06 DIAGNOSIS — Z96653 Presence of artificial knee joint, bilateral: Secondary | ICD-10-CM | POA: Diagnosis present

## 2018-07-06 DIAGNOSIS — Z800 Family history of malignant neoplasm of digestive organs: Secondary | ICD-10-CM

## 2018-07-06 DIAGNOSIS — K219 Gastro-esophageal reflux disease without esophagitis: Secondary | ICD-10-CM | POA: Diagnosis present

## 2018-07-06 DIAGNOSIS — Z96641 Presence of right artificial hip joint: Secondary | ICD-10-CM | POA: Diagnosis present

## 2018-07-06 DIAGNOSIS — M48062 Spinal stenosis, lumbar region with neurogenic claudication: Secondary | ICD-10-CM | POA: Diagnosis present

## 2018-07-06 DIAGNOSIS — Z981 Arthrodesis status: Secondary | ICD-10-CM | POA: Diagnosis not present

## 2018-07-06 DIAGNOSIS — Z888 Allergy status to other drugs, medicaments and biological substances status: Secondary | ICD-10-CM

## 2018-07-06 DIAGNOSIS — Z79890 Hormone replacement therapy (postmenopausal): Secondary | ICD-10-CM | POA: Diagnosis not present

## 2018-07-06 DIAGNOSIS — G2581 Restless legs syndrome: Secondary | ICD-10-CM | POA: Diagnosis present

## 2018-07-06 DIAGNOSIS — F329 Major depressive disorder, single episode, unspecified: Secondary | ICD-10-CM | POA: Diagnosis present

## 2018-07-06 DIAGNOSIS — I951 Orthostatic hypotension: Secondary | ICD-10-CM | POA: Diagnosis not present

## 2018-07-06 DIAGNOSIS — M4800 Spinal stenosis, site unspecified: Secondary | ICD-10-CM | POA: Diagnosis present

## 2018-07-06 DIAGNOSIS — Z79899 Other long term (current) drug therapy: Secondary | ICD-10-CM

## 2018-07-06 DIAGNOSIS — Z885 Allergy status to narcotic agent status: Secondary | ICD-10-CM

## 2018-07-06 DIAGNOSIS — E119 Type 2 diabetes mellitus without complications: Secondary | ICD-10-CM | POA: Diagnosis present

## 2018-07-06 DIAGNOSIS — K089 Disorder of teeth and supporting structures, unspecified: Secondary | ICD-10-CM | POA: Diagnosis present

## 2018-07-06 DIAGNOSIS — Z91018 Allergy to other foods: Secondary | ICD-10-CM

## 2018-07-06 DIAGNOSIS — Z8249 Family history of ischemic heart disease and other diseases of the circulatory system: Secondary | ICD-10-CM | POA: Diagnosis not present

## 2018-07-06 DIAGNOSIS — Z91048 Other nonmedicinal substance allergy status: Secondary | ICD-10-CM

## 2018-07-06 DIAGNOSIS — K900 Celiac disease: Secondary | ICD-10-CM | POA: Diagnosis present

## 2018-07-06 DIAGNOSIS — Z419 Encounter for procedure for purposes other than remedying health state, unspecified: Secondary | ICD-10-CM

## 2018-07-06 HISTORY — PX: APPLICATION OF INTRAOPERATIVE CT SCAN: SHX6668

## 2018-07-06 LAB — MRSA PCR SCREENING: MRSA BY PCR: NEGATIVE

## 2018-07-06 LAB — GLUCOSE, CAPILLARY
GLUCOSE-CAPILLARY: 267 mg/dL — AB (ref 70–99)
Glucose-Capillary: 134 mg/dL — ABNORMAL HIGH (ref 70–99)
Glucose-Capillary: 170 mg/dL — ABNORMAL HIGH (ref 70–99)
Glucose-Capillary: 204 mg/dL — ABNORMAL HIGH (ref 70–99)

## 2018-07-06 LAB — HEMOGLOBIN A1C
Hgb A1c MFr Bld: 7.1 % — ABNORMAL HIGH (ref 4.8–5.6)
Mean Plasma Glucose: 157.07 mg/dL

## 2018-07-06 SURGERY — POSTERIOR LUMBAR FUSION 3 LEVEL
Anesthesia: General | Site: Spine Lumbar

## 2018-07-06 MED ORDER — SODIUM CHLORIDE 0.9% FLUSH
3.0000 mL | Freq: Two times a day (BID) | INTRAVENOUS | Status: DC
  Administered 2018-07-07 – 2018-07-08 (×4): 3 mL via INTRAVENOUS

## 2018-07-06 MED ORDER — THROMBIN 20000 UNITS EX SOLR
CUTANEOUS | Status: DC | PRN
  Administered 2018-07-06: 20 mL via TOPICAL

## 2018-07-06 MED ORDER — SODIUM CHLORIDE 0.9 % IV SOLN
250.0000 mL | INTRAVENOUS | Status: DC
  Administered 2018-07-06 – 2018-07-07 (×2): 250 mL via INTRAVENOUS

## 2018-07-06 MED ORDER — SODIUM CHLORIDE 0.9 % IV SOLN
INTRAVENOUS | Status: DC | PRN
  Administered 2018-07-06: 500 mL

## 2018-07-06 MED ORDER — CYCLOBENZAPRINE HCL 10 MG PO TABS: 10.0000 mg | ORAL_TABLET | Freq: Three times a day (TID) | ORAL | Status: DC | PRN

## 2018-07-06 MED ORDER — PHENOL 1.4 % MT LIQD: 1.0000 | OROMUCOSAL | Status: DC | PRN

## 2018-07-06 MED ORDER — METFORMIN HCL ER 500 MG PO TB24
500.0000 mg | ORAL_TABLET | Freq: Every day | ORAL | Status: DC
  Administered 2018-07-07 – 2018-07-09 (×3): 500 mg via ORAL
  Filled 2018-07-06 (×3): qty 1

## 2018-07-06 MED ORDER — CEFAZOLIN SODIUM 1 G IJ SOLR
INTRAMUSCULAR | Status: AC
  Filled 2018-07-06: qty 20

## 2018-07-06 MED ORDER — THROMBIN 5000 UNITS EX SOLR
OROMUCOSAL | Status: DC | PRN
  Administered 2018-07-06 (×4): 5 mL via TOPICAL

## 2018-07-06 MED ORDER — BACITRACIN ZINC 500 UNIT/GM EX OINT
TOPICAL_OINTMENT | CUTANEOUS | Status: DC | PRN
  Administered 2018-07-06: 1 via TOPICAL

## 2018-07-06 MED ORDER — INSULIN ASPART 100 UNIT/ML ~~LOC~~ SOLN
0.0000 [IU] | Freq: Three times a day (TID) | SUBCUTANEOUS | Status: DC
  Administered 2018-07-07: 3 [IU] via SUBCUTANEOUS
  Administered 2018-07-07 – 2018-07-08 (×3): 4 [IU] via SUBCUTANEOUS
  Administered 2018-07-08: 7 [IU] via SUBCUTANEOUS
  Administered 2018-07-08 – 2018-07-09 (×4): 3 [IU] via SUBCUTANEOUS

## 2018-07-06 MED ORDER — 0.9 % SODIUM CHLORIDE (POUR BTL) OPTIME
TOPICAL | Status: DC | PRN
  Administered 2018-07-06 (×2): 1000 mL

## 2018-07-06 MED ORDER — MIDAZOLAM HCL 2 MG/2ML IJ SOLN
INTRAMUSCULAR | Status: DC | PRN
  Administered 2018-07-06: 1 mg via INTRAVENOUS

## 2018-07-06 MED ORDER — VANCOMYCIN HCL 1000 MG IV SOLR
INTRAVENOUS | Status: AC
  Filled 2018-07-06: qty 1000

## 2018-07-06 MED ORDER — LIDOCAINE HCL (CARDIAC) PF 100 MG/5ML IV SOSY
PREFILLED_SYRINGE | INTRAVENOUS | Status: DC | PRN
  Administered 2018-07-06: 100 mg via INTRAVENOUS

## 2018-07-06 MED ORDER — ONDANSETRON HCL 4 MG/2ML IJ SOLN: 4.0000 mg | Freq: Once | INTRAMUSCULAR | Status: DC | PRN

## 2018-07-06 MED ORDER — DEXTROSE 50 % IV SOLN
INTRAVENOUS | Status: AC
  Filled 2018-07-06: qty 50

## 2018-07-06 MED ORDER — ACETAMINOPHEN 500 MG PO TABS
1000.0000 mg | ORAL_TABLET | Freq: Four times a day (QID) | ORAL | Status: AC
  Administered 2018-07-06 – 2018-07-07 (×4): 1000 mg via ORAL
  Filled 2018-07-06 (×4): qty 2

## 2018-07-06 MED ORDER — THROMBIN 20000 UNITS EX SOLR
CUTANEOUS | Status: AC
  Filled 2018-07-06: qty 20000

## 2018-07-06 MED ORDER — VANCOMYCIN HCL 1 G IV SOLR
INTRAVENOUS | Status: DC | PRN
  Administered 2018-07-06: 1000 mg via TOPICAL

## 2018-07-06 MED ORDER — FENTANYL CITRATE (PF) 250 MCG/5ML IJ SOLN
INTRAMUSCULAR | Status: AC
  Filled 2018-07-06: qty 5

## 2018-07-06 MED ORDER — PHENYLEPHRINE 40 MCG/ML (10ML) SYRINGE FOR IV PUSH (FOR BLOOD PRESSURE SUPPORT)
PREFILLED_SYRINGE | INTRAVENOUS | Status: AC
  Filled 2018-07-06: qty 10

## 2018-07-06 MED ORDER — MENTHOL 3 MG MT LOZG: 1.0000 | LOZENGE | OROMUCOSAL | Status: DC | PRN

## 2018-07-06 MED ORDER — ROCURONIUM BROMIDE 50 MG/5ML IV SOSY
PREFILLED_SYRINGE | INTRAVENOUS | Status: AC
  Filled 2018-07-06: qty 5

## 2018-07-06 MED ORDER — THROMBIN 5000 UNITS EX SOLR
CUTANEOUS | Status: AC
  Filled 2018-07-06: qty 5000

## 2018-07-06 MED ORDER — FENTANYL CITRATE (PF) 250 MCG/5ML IJ SOLN
INTRAMUSCULAR | Status: DC | PRN
  Administered 2018-07-06: 50 ug via INTRAVENOUS
  Administered 2018-07-06: 100 ug via INTRAVENOUS

## 2018-07-06 MED ORDER — MORPHINE SULFATE (PF) 4 MG/ML IV SOLN
4.0000 mg | INTRAVENOUS | Status: DC | PRN
  Administered 2018-07-07: 4 mg via INTRAVENOUS
  Filled 2018-07-06: qty 1

## 2018-07-06 MED ORDER — ONDANSETRON HCL 4 MG PO TABS: 4.0000 mg | ORAL_TABLET | Freq: Four times a day (QID) | ORAL | Status: DC | PRN

## 2018-07-06 MED ORDER — CEFAZOLIN SODIUM-DEXTROSE 2-4 GM/100ML-% IV SOLN
2.0000 g | Freq: Three times a day (TID) | INTRAVENOUS | Status: AC
  Administered 2018-07-06 – 2018-07-07 (×2): 2 g via INTRAVENOUS
  Filled 2018-07-06 (×2): qty 100

## 2018-07-06 MED ORDER — PROPOFOL 10 MG/ML IV BOLUS
INTRAVENOUS | Status: AC
  Filled 2018-07-06: qty 20

## 2018-07-06 MED ORDER — LEVOTHYROXINE SODIUM 50 MCG PO TABS
50.0000 ug | ORAL_TABLET | Freq: Every day | ORAL | Status: DC
  Administered 2018-07-07 – 2018-07-09 (×3): 50 ug via ORAL
  Filled 2018-07-06 (×3): qty 1

## 2018-07-06 MED ORDER — LIDOCAINE 2% (20 MG/ML) 5 ML SYRINGE
INTRAMUSCULAR | Status: AC
  Filled 2018-07-06: qty 5

## 2018-07-06 MED ORDER — ONDANSETRON HCL 4 MG/2ML IJ SOLN
4.0000 mg | Freq: Four times a day (QID) | INTRAMUSCULAR | Status: DC | PRN
  Administered 2018-07-07: 4 mg via INTRAVENOUS
  Filled 2018-07-06: qty 2

## 2018-07-06 MED ORDER — LORATADINE 10 MG PO TABS
10.0000 mg | ORAL_TABLET | Freq: Every day | ORAL | Status: DC
  Administered 2018-07-06: 10 mg via ORAL
  Filled 2018-07-06: qty 1

## 2018-07-06 MED ORDER — INSULIN ASPART 100 UNIT/ML ~~LOC~~ SOLN
0.0000 [IU] | SUBCUTANEOUS | Status: DC
  Administered 2018-07-06: 11 [IU] via SUBCUTANEOUS
  Filled 2018-07-06: qty 0.2

## 2018-07-06 MED ORDER — HYDROMORPHONE HCL 1 MG/ML IJ SOLN
0.2500 mg | INTRAMUSCULAR | Status: DC | PRN
  Administered 2018-07-06 (×4): 0.5 mg via INTRAVENOUS

## 2018-07-06 MED ORDER — SODIUM CHLORIDE 0.9 % IV SOLN
INTRAVENOUS | Status: DC | PRN
  Administered 2018-07-06 (×2): via INTRAVENOUS
  Administered 2018-07-06: 40 ug/min via INTRAVENOUS

## 2018-07-06 MED ORDER — ONDANSETRON HCL 4 MG/2ML IJ SOLN
INTRAMUSCULAR | Status: AC
  Filled 2018-07-06: qty 2

## 2018-07-06 MED ORDER — SODIUM CHLORIDE 0.9% FLUSH: 3.0000 mL | INTRAVENOUS | Status: DC | PRN

## 2018-07-06 MED ORDER — OXYCODONE HCL 5 MG PO TABS: 5.0000 mg | ORAL_TABLET | Freq: Once | ORAL | Status: DC | PRN

## 2018-07-06 MED ORDER — PROPOFOL 10 MG/ML IV BOLUS
INTRAVENOUS | Status: DC | PRN
  Administered 2018-07-06: 170 mg via INTRAVENOUS

## 2018-07-06 MED ORDER — LACTATED RINGERS IV SOLN
INTRAVENOUS | Status: DC | PRN
  Administered 2018-07-06: 07:00:00 via INTRAVENOUS

## 2018-07-06 MED ORDER — HYDROMORPHONE HCL 1 MG/ML IJ SOLN
INTRAMUSCULAR | Status: AC
  Filled 2018-07-06: qty 2

## 2018-07-06 MED ORDER — ZOLPIDEM TARTRATE 5 MG PO TABS: 5.0000 mg | ORAL_TABLET | Freq: Every evening | ORAL | Status: DC | PRN

## 2018-07-06 MED ORDER — BACITRACIN ZINC 500 UNIT/GM EX OINT
TOPICAL_OINTMENT | CUTANEOUS | Status: AC
  Filled 2018-07-06: qty 28.35

## 2018-07-06 MED ORDER — PHENYLEPHRINE HCL 10 MG/ML IJ SOLN
INTRAMUSCULAR | Status: AC
  Filled 2018-07-06: qty 1

## 2018-07-06 MED ORDER — DEXAMETHASONE SODIUM PHOSPHATE 10 MG/ML IJ SOLN
INTRAMUSCULAR | Status: DC | PRN
  Administered 2018-07-06: 5 mg via INTRAVENOUS

## 2018-07-06 MED ORDER — PHENYLEPHRINE 40 MCG/ML (10ML) SYRINGE FOR IV PUSH (FOR BLOOD PRESSURE SUPPORT)
PREFILLED_SYRINGE | INTRAVENOUS | Status: AC
  Filled 2018-07-06: qty 20

## 2018-07-06 MED ORDER — PRAMIPEXOLE DIHYDROCHLORIDE 1 MG PO TABS
1.0000 mg | ORAL_TABLET | Freq: Two times a day (BID) | ORAL | Status: DC
  Administered 2018-07-06 – 2018-07-09 (×6): 1 mg via ORAL
  Filled 2018-07-06 (×6): qty 1

## 2018-07-06 MED ORDER — BUPIVACAINE-EPINEPHRINE (PF) 0.25% -1:200000 IJ SOLN
INTRAMUSCULAR | Status: DC | PRN
  Administered 2018-07-06: 10 mL

## 2018-07-06 MED ORDER — SODIUM CHLORIDE 0.9 % IV SOLN
INTRAVENOUS | Status: DC | PRN
  Administered 2018-07-06: 75 ug/min via INTRAVENOUS

## 2018-07-06 MED ORDER — DOCUSATE SODIUM 100 MG PO CAPS
100.0000 mg | ORAL_CAPSULE | Freq: Two times a day (BID) | ORAL | Status: DC
  Administered 2018-07-06 – 2018-07-09 (×6): 100 mg via ORAL
  Filled 2018-07-06 (×6): qty 1

## 2018-07-06 MED ORDER — ONDANSETRON HCL 4 MG/2ML IJ SOLN
INTRAMUSCULAR | Status: DC | PRN
  Administered 2018-07-06: 4 mg via INTRAVENOUS

## 2018-07-06 MED ORDER — PHENYLEPHRINE 40 MCG/ML (10ML) SYRINGE FOR IV PUSH (FOR BLOOD PRESSURE SUPPORT)
PREFILLED_SYRINGE | INTRAVENOUS | Status: DC | PRN
  Administered 2018-07-06: 100 ug via INTRAVENOUS
  Administered 2018-07-06: 60 ug via INTRAVENOUS
  Administered 2018-07-06: 40 ug via INTRAVENOUS
  Administered 2018-07-06: 80 ug via INTRAVENOUS
  Administered 2018-07-06 (×2): 40 ug via INTRAVENOUS
  Administered 2018-07-06: 80 ug via INTRAVENOUS
  Administered 2018-07-06: 120 ug via INTRAVENOUS
  Administered 2018-07-06: 40 ug via INTRAVENOUS
  Administered 2018-07-06: 80 ug via INTRAVENOUS
  Administered 2018-07-06: 120 ug via INTRAVENOUS
  Administered 2018-07-06: 40 ug via INTRAVENOUS
  Administered 2018-07-06: 120 ug via INTRAVENOUS

## 2018-07-06 MED ORDER — ACETAMINOPHEN 325 MG PO TABS
650.0000 mg | ORAL_TABLET | ORAL | Status: DC | PRN
  Administered 2018-07-08 – 2018-07-09 (×3): 650 mg via ORAL
  Filled 2018-07-06 (×3): qty 2

## 2018-07-06 MED ORDER — ACETAMINOPHEN 650 MG RE SUPP: 650.0000 mg | RECTAL | Status: DC | PRN

## 2018-07-06 MED ORDER — DEXAMETHASONE SODIUM PHOSPHATE 10 MG/ML IJ SOLN
INTRAMUSCULAR | Status: AC
  Filled 2018-07-06: qty 1

## 2018-07-06 MED ORDER — OXYCODONE HCL 5 MG PO TABS
10.0000 mg | ORAL_TABLET | ORAL | Status: DC | PRN
  Administered 2018-07-08 (×4): 10 mg via ORAL
  Filled 2018-07-06 (×4): qty 2

## 2018-07-06 MED ORDER — BISACODYL 10 MG RE SUPP: 10.0000 mg | Freq: Every day | RECTAL | Status: DC | PRN

## 2018-07-06 MED ORDER — MIDAZOLAM HCL 2 MG/2ML IJ SOLN
INTRAMUSCULAR | Status: AC
  Filled 2018-07-06: qty 2

## 2018-07-06 MED ORDER — ARTIFICIAL TEARS OPHTHALMIC OINT
TOPICAL_OINTMENT | OPHTHALMIC | Status: DC | PRN
  Administered 2018-07-06: 1 via OPHTHALMIC

## 2018-07-06 MED ORDER — OXYCODONE HCL 5 MG/5ML PO SOLN: 5.0000 mg | Freq: Once | ORAL | Status: DC | PRN

## 2018-07-06 MED ORDER — FLUTICASONE PROPIONATE 50 MCG/ACT NA SUSP
2.0000 | Freq: Every day | NASAL | Status: DC
  Administered 2018-07-06 – 2018-07-09 (×4): 2 via NASAL
  Filled 2018-07-06: qty 16

## 2018-07-06 MED ORDER — SUGAMMADEX SODIUM 200 MG/2ML IV SOLN
INTRAVENOUS | Status: DC | PRN
  Administered 2018-07-06: 200 mg via INTRAVENOUS

## 2018-07-06 MED ORDER — SUFENTANIL CITRATE 250 MCG/5ML IV SOLN
0.2500 ug/kg/h | INTRAVENOUS | Status: AC
  Administered 2018-07-06: .2 ug/kg/h via INTRAVENOUS
  Filled 2018-07-06 (×3): qty 5

## 2018-07-06 MED ORDER — ALBUMIN HUMAN 5 % IV SOLN
INTRAVENOUS | Status: DC | PRN
  Administered 2018-07-06 (×2): via INTRAVENOUS

## 2018-07-06 MED ORDER — LACTATED RINGERS IV SOLN
INTRAVENOUS | Status: DC | PRN
  Administered 2018-07-06 (×3): via INTRAVENOUS

## 2018-07-06 MED ORDER — PHENYLEPHRINE HCL 10 MG/ML IJ SOLN
INTRAMUSCULAR | Status: AC
  Filled 2018-07-06: qty 2

## 2018-07-06 MED ORDER — MAGNESIUM OXIDE 400 (241.3 MG) MG PO TABS
400.0000 mg | ORAL_TABLET | Freq: Every day | ORAL | Status: DC
  Administered 2018-07-07 – 2018-07-09 (×3): 400 mg via ORAL
  Filled 2018-07-06 (×3): qty 1

## 2018-07-06 MED ORDER — CEFAZOLIN SODIUM-DEXTROSE 2-4 GM/100ML-% IV SOLN
2.0000 g | INTRAVENOUS | Status: AC
  Administered 2018-07-06 (×3): 2 g via INTRAVENOUS
  Filled 2018-07-06: qty 100

## 2018-07-06 MED ORDER — BUPIVACAINE LIPOSOME 1.3 % IJ SUSP
20.0000 mL | INTRAMUSCULAR | Status: AC
  Administered 2018-07-06: 20 mL
  Filled 2018-07-06: qty 20

## 2018-07-06 MED ORDER — ROCURONIUM BROMIDE 100 MG/10ML IV SOLN
INTRAVENOUS | Status: DC | PRN
  Administered 2018-07-06: 10 mg via INTRAVENOUS
  Administered 2018-07-06: 50 mg via INTRAVENOUS
  Administered 2018-07-06: 20 mg via INTRAVENOUS
  Administered 2018-07-06: 50 mg via INTRAVENOUS
  Administered 2018-07-06: 20 mg via INTRAVENOUS
  Administered 2018-07-06: 30 mg via INTRAVENOUS
  Administered 2018-07-06: 20 mg via INTRAVENOUS
  Administered 2018-07-06: 10 mg via INTRAVENOUS
  Administered 2018-07-06: 20 mg via INTRAVENOUS

## 2018-07-06 MED ORDER — OXYCODONE HCL 5 MG PO TABS
5.0000 mg | ORAL_TABLET | ORAL | Status: DC | PRN
  Administered 2018-07-06 – 2018-07-09 (×9): 5 mg via ORAL
  Filled 2018-07-06 (×9): qty 1

## 2018-07-06 SURGICAL SUPPLY — 93 items
APL SKNCLS STERI-STRIP NONHPOA (GAUZE/BANDAGES/DRESSINGS) ×2
BAG DECANTER FOR FLEXI CONT (MISCELLANEOUS) ×4 IMPLANT
BASKET BONE COLLECTION (BASKET) ×2 IMPLANT
BENZOIN TINCTURE PRP APPL 2/3 (GAUZE/BANDAGES/DRESSINGS) ×4 IMPLANT
BLADE CLIPPER SURG (BLADE) IMPLANT
BUR MATCHSTICK NEURO 3.0 LAGG (BURR) ×4 IMPLANT
BUR PRECISION FLUTE 6.0 (BURR) ×4 IMPLANT
CAGE ALTERA 10X31X9-13 15D (Cage) ×1 IMPLANT
CAGE ALTERA 9-13-15-31MM (Cage) ×1 IMPLANT
CANISTER SUCT 3000ML PPV (MISCELLANEOUS) ×4 IMPLANT
CARTRIDGE OIL MAESTRO DRILL (MISCELLANEOUS) ×4 IMPLANT
CLOSURE STERI-STRIP 1/2X4 (GAUZE/BANDAGES/DRESSINGS) ×1
CLOSURE WOUND 1/2 X4 (GAUZE/BANDAGES/DRESSINGS) ×1
CLSR STERI-STRIP ANTIMIC 1/2X4 (GAUZE/BANDAGES/DRESSINGS) ×1 IMPLANT
CONT SPEC 4OZ CLIKSEAL STRL BL (MISCELLANEOUS) ×4 IMPLANT
COVER BACK TABLE 60X90IN (DRAPES) ×8 IMPLANT
COVER WAND RF STERILE (DRAPES) ×4 IMPLANT
CROSSLINK DANEK (Orthopedic Implant) ×4 IMPLANT
DIFFUSER DRILL AIR PNEUMATIC (MISCELLANEOUS) ×6 IMPLANT
DRAPE C-ARM 42X72 X-RAY (DRAPES) ×4 IMPLANT
DRAPE HALF SHEET 40X57 (DRAPES) ×6 IMPLANT
DRAPE LAPAROTOMY 100X72X124 (DRAPES) ×4 IMPLANT
DRAPE SCAN PATIENT (DRAPES) ×10 IMPLANT
DRAPE SURG 17X23 STRL (DRAPES) ×16 IMPLANT
DRSG OPSITE POSTOP 4X10 (GAUZE/BANDAGES/DRESSINGS) ×2 IMPLANT
DRSG OPSITE POSTOP 4X8 (GAUZE/BANDAGES/DRESSINGS) ×2 IMPLANT
ELECT BLADE 4.0 EZ CLEAN MEGAD (MISCELLANEOUS) ×4
ELECT REM PT RETURN 9FT ADLT (ELECTROSURGICAL) ×4
ELECTRODE BLDE 4.0 EZ CLN MEGD (MISCELLANEOUS) ×2 IMPLANT
ELECTRODE REM PT RTRN 9FT ADLT (ELECTROSURGICAL) ×2 IMPLANT
GAUZE 4X4 16PLY RFD (DISPOSABLE) ×4 IMPLANT
GAUZE SPONGE 4X4 12PLY STRL (GAUZE/BANDAGES/DRESSINGS) ×2 IMPLANT
GLOVE BIO SURGEON STRL SZ 6.5 (GLOVE) ×3 IMPLANT
GLOVE BIO SURGEON STRL SZ7 (GLOVE) ×4 IMPLANT
GLOVE BIO SURGEON STRL SZ8 (GLOVE) ×10 IMPLANT
GLOVE BIO SURGEON STRL SZ8.5 (GLOVE) ×10 IMPLANT
GLOVE BIO SURGEONS STRL SZ 6.5 (GLOVE) ×3
GLOVE BIOGEL PI IND STRL 6.5 (GLOVE) IMPLANT
GLOVE BIOGEL PI IND STRL 7.0 (GLOVE) IMPLANT
GLOVE BIOGEL PI IND STRL 7.5 (GLOVE) IMPLANT
GLOVE BIOGEL PI INDICATOR 6.5 (GLOVE) ×6
GLOVE BIOGEL PI INDICATOR 7.0 (GLOVE) ×8
GLOVE BIOGEL PI INDICATOR 7.5 (GLOVE) ×8
GLOVE EXAM NITRILE XL STR (GLOVE) IMPLANT
GLOVE SS N UNI LF 6.0 STRL (GLOVE) ×4 IMPLANT
GLOVE SS N UNI LF 6.5 STRL (GLOVE) ×4 IMPLANT
GLOVE SURG SS PI 6.0 STRL IVOR (GLOVE) ×4 IMPLANT
GOWN STRL REUS W/ TWL LRG LVL3 (GOWN DISPOSABLE) IMPLANT
GOWN STRL REUS W/ TWL XL LVL3 (GOWN DISPOSABLE) ×4 IMPLANT
GOWN STRL REUS W/TWL 2XL LVL3 (GOWN DISPOSABLE) IMPLANT
GOWN STRL REUS W/TWL LRG LVL3 (GOWN DISPOSABLE) ×20
GOWN STRL REUS W/TWL XL LVL3 (GOWN DISPOSABLE) ×16
HEMOSTAT POWDER KIT SURGIFOAM (HEMOSTASIS) ×8 IMPLANT
KIT BASIN OR (CUSTOM PROCEDURE TRAY) ×4 IMPLANT
KIT INFUSE MEDIUM (Orthopedic Implant) ×2 IMPLANT
KIT TURNOVER KIT B (KITS) ×4 IMPLANT
MARKER SPHERE PSV REFLC 13MM (MARKER) ×24 IMPLANT
MILL MEDIUM DISP (BLADE) ×4 IMPLANT
NDL HYPO 21X1.5 SAFETY (NEEDLE) IMPLANT
NEEDLE HYPO 21X1.5 SAFETY (NEEDLE) ×4 IMPLANT
NEEDLE HYPO 22GX1.5 SAFETY (NEEDLE) ×4 IMPLANT
NS IRRIG 1000ML POUR BTL (IV SOLUTION) ×6 IMPLANT
OIL CARTRIDGE MAESTRO DRILL (MISCELLANEOUS) ×4
PACK LAMINECTOMY NEURO (CUSTOM PROCEDURE TRAY) ×4 IMPLANT
PAD ARMBOARD 7.5X6 YLW CONV (MISCELLANEOUS) ×12 IMPLANT
PATTIES SURGICAL .5 X.5 (GAUZE/BANDAGES/DRESSINGS) ×2 IMPLANT
PATTIES SURGICAL .5 X1 (DISPOSABLE) IMPLANT
PATTIES SURGICAL 1X1 (DISPOSABLE) ×6 IMPLANT
PLATE BN 1.75-2.15XLO BAR (Orthopedic Implant) IMPLANT
PUTTY DBM 10CC CALC GRAN (Putty) ×2 IMPLANT
ROD L635 HEX END UNLINED (Rod) ×4 IMPLANT
SCREW ILIAC 8.5X80 (Screw) ×4 IMPLANT
SCREW PEDICLE VA L635 6.5X40M (Screw) ×8 IMPLANT
SCREW PEDICLE VA L635 6.5X45M (Screw) ×6 IMPLANT
SCREW PEDICLE VA L635 6.5X50M (Screw) ×6 IMPLANT
SCREW PEDICLE VA L635 7.5X45M (Screw) ×4 IMPLANT
SCREW SET BREAK OFF (Screw) ×36 IMPLANT
SCREW SET ILIAC (Screw) ×4 IMPLANT
SPACER ALTERA 10X31 8-12MM-8 (Spacer) ×4 IMPLANT
SPONGE LAP 4X18 RFD (DISPOSABLE) ×4 IMPLANT
SPONGE NEURO XRAY DETECT 1X3 (DISPOSABLE) IMPLANT
SPONGE SURGIFOAM ABS GEL 100 (HEMOSTASIS) ×2 IMPLANT
STRIP BIOACTIVE 10CC 25X100X4 (Miscellaneous) ×2 IMPLANT
STRIP CLOSURE SKIN 1/2X4 (GAUZE/BANDAGES/DRESSINGS) ×3 IMPLANT
SUT VIC AB 1 CT1 18XBRD ANBCTR (SUTURE) ×4 IMPLANT
SUT VIC AB 1 CT1 8-18 (SUTURE) ×20
SUT VIC AB 2-0 CP2 18 (SUTURE) ×10 IMPLANT
SYR 20CC LL (SYRINGE) ×2 IMPLANT
SYR CONTROL 10ML LL (SYRINGE) ×2 IMPLANT
TOWEL GREEN STERILE (TOWEL DISPOSABLE) ×4 IMPLANT
TOWEL GREEN STERILE FF (TOWEL DISPOSABLE) ×4 IMPLANT
TRAY FOLEY MTR SLVR 16FR STAT (SET/KITS/TRAYS/PACK) ×4 IMPLANT
WATER STERILE IRR 1000ML POUR (IV SOLUTION) ×4 IMPLANT

## 2018-07-06 NOTE — Transfer of Care (Signed)
Immediate Anesthesia Transfer of Care Note  Patient: Valerie Duran  Procedure(s) Performed: POSTERIOR LUMBAR INTERBODY FUSION, INTERBODY PROSTHESIS, LUMBAR ONE- LUMBAR TWO, LUMBAR TWO- LUMBAR THREE, LUMBAR FIVE- SACRAL ONE, INSTRUMENTATION AND FUSION THORACIC TEN TO ILIUM (N/A Spine Lumbar) APPLICATION OF INTRAOPERATIVE CT SCAN (N/A )  Patient Location: PACU  Anesthesia Type:General  Level of Consciousness: awake, alert  and oriented  Airway & Oxygen Therapy: Patient Spontanous Breathing and Patient connected to nasal cannula oxygen  Post-op Assessment: Report given to RN and Post -op Vital signs reviewed and stable  Post vital signs: Reviewed and stable  Last Vitals:  Vitals Value Taken Time  BP 128/103 07/06/2018  8:30 PM  Temp 36.5 C 07/06/2018  8:27 PM  Pulse 118 07/06/2018  8:30 PM  Resp 26 07/06/2018  8:30 PM  SpO2 97 % 07/06/2018  8:30 PM  Vitals shown include unvalidated device data.  Last Pain:  Vitals:   07/06/18 0712  TempSrc:   PainSc: 7          Complications: No apparent anesthesia complications

## 2018-07-06 NOTE — Op Note (Signed)
Brief history: The patient is a 73 year old white female on whom I previously performed an L3-4 and L4-5 decompression, instrumentation and fusion.  She initially did well but has developed recurrent back and leg pain insistent with neurogenic claudication.  She failed medical management and was worked up with a CT which demonstrated the patient had degenerative disease, spinal stenosis, etc.  I discussed the various treatment options with her.  She has decided to proceed with an exploration of her lumbar fusion and a thoracolumbar decompression, instrumentation and fusion.  Preoperative diagnosis: Lumbar and lumbosacral degenerative disc disease, spinal stenosis; lumbago; lumbar radiculopathy; neurogenic claudication  Postoperative diagnosis: The same  Procedure: Bilateral L1-2, L2-3 and L5-S1 laminotomy/foraminotomies/medial facetectomy to decompress the bilateral L2, L3 and S1 nerve roots(the work required to do this was in addition to the work required to do the posterior lumbar interbody fusion because of the patient's spinal stenosis, facet arthropathy. Etc. requiring a wide decompression of the nerve roots.);  L1-2, L2-3 and L5-S1 transforaminal lumbar interbody fusion with local morselized autograft bone, Zimmer DBM, bone morphogenic protein soaked collagen sponges, and Kinnex graft extender; insertion of interbody prosthesis at L1-2, L2-3 and L5-S1 (globus peek expandable interbody prosthesis); posterior segmental instrumentation from T10 to ilium with Medtronic titanium pedicle screws and rods; posterior lateral arthrodesis at 1011, T11-12, T12-L1, L1-2, L2-3 and L5-S1 with local morselized autograft bone and Kinnex bone graft extender; exploration of lumbar fusion; application of pelvic fixation; application of spinal neuro navigation  Surgeon: Dr. Earle Gell  Asst.: Arnetha Massy nurse practitioner  Anesthesia: Gen. endotracheal  Estimated blood loss: 600 cc  Drains: One large Hemovac  in the epidural space  Complications: None  Description of procedure: The patient was brought to the operating room by the anesthesia team. General endotracheal anesthesia was induced. The patient was turned to the prone position on the Emmons frame. The patient's thoracolumbar was then prepared with Betadine scrub and Betadine solution. Sterile drapes were applied.  I then injected the area to be incised with Marcaine with epinephrine solution. I then used the scalpel to make a linear midline incision from T10 down to the sacrum, incising through the old surgical scar. I then used electrocautery to perform a bilateral subperiosteal dissection exposing the spinous process and lamina of T10 down to the sacrum, exposing the old hardware from L3-L5.  We inserted the cerebellar retractors for exposure.  We explored the lumbar fusion by removing the caps from the old screws, removing the cross connector and the old rod.  We inspect the arthrodesis at L3-4 and L4-5.  It appeared solid.  I began the decompression by using the high speed drill to perform laminotomies at bilaterally at L1-2, L2-3 and L5-S1. We then used the Kerrison punches to widen the laminotomy and removed the ligamentum flavum at bilaterally at L1-2, L2-3 and L5-S1. We used the Kerrison punches to remove the medial facets at bilaterally at the 1 2, L2-3 and L5-S1. We performed wide foraminotomies about the bilateral L2, L3 and S1 nerve roots completing the decompression.  We now turned our attention to the posterior lumbar interbody fusion. I used a scalpel to incise the intervertebral disc at L1-2, L2-3 and L5-S1 bilaterally. I then performed a partial intervertebral discectomy at L1-2, L2-3 and L5-S1 bilaterally using the pituitary forceps. We prepared the vertebral endplates at Q7-6, P9-5 and L5-S1 bilaterally for the fusion by removing the soft tissues with the curettes. We then used the trial spacers to pick the appropriate sized  interbody prosthesis. We prefilled his prosthesis with a combination of local morselized autograft bone that we obtained during the decompression as well as Kinnex bone graft extender. We inserted the prefilled prosthesis into the interspace at L1-2, L2-3 and L5-S1, we then turned and expanded the prosthesis. There was a good snug fit of the prosthesis in the interspace. We then filled and the remainder of the intervertebral disc space with local morselized autograft bone, bone morphogenic-soaked collagen sponges, Zimmer and DBM and Kinnex. This completed the posterior lumbar interbody arthrodesis.  We placed a fiducial on the L1 spinous process and obtained intraoperative CT scan for the arrow spinal navigation unit.  We now turned attention to the instrumentation. Under arrow guidance we cannulated the bilateral T10, T11, T12, L1, L2 and S1 pedicles with the bone probe. We then removed the bone probe. We then tapped the pedicle . We then removed the tap. We probed inside the tapped pedicle with a ball probe to rule out cortical breaches. We then inserted a 6.5 and 7.5 millimeter pedicle screw into the T10, T11, T12, L1, L2 and S1 pedicles bilaterally under arrow spinal guidance.  We had to adjust the right L1 pedicle screw because it was somewhat lateral.  The nerve roots were not injured.  Under arrow spinal navigation guidance we placed bilateral 8.5 x 80 S2 AI screws.  We had to adjust the left ilium screw.  We then connected the unilateral pedicle screws and ilium screws with a lordotic rod. We compressed the construct and secured the rod in place with the caps. We then tightened the caps appropriately. This completed the instrumentation from T10 to the ilium.  We now turned our attention to the posterior lateral arthrodesis at T10-11, T11-12, T12-L1, L1-2, L2-3 and L5-S1. We used the high-speed drill to decorticate the remainder of the facets, pars, transverse process at T10-11, T11-12, T12-L1, L1-2,  L2-3 and L5-S1. We then applied a combination of local morselized autograft bone, bone morphogenic protein soaked collagen sponges, Zimmer DBM and Kinnex bone graft extender over these decorticated posterior lateral structures. This completed the posterior lateral arthrodesis.  We then obtained hemostasis using bipolar electrocautery. We irrigated the wound out with bacitracin solution. We inspected the thecal sac and nerve roots and noted they were well decompressed. We then removed the retractor.  We placed a large Hemovac drain in the epidural space and tunneled it out through a separate stab wound.  We placed vancomycin powder in the wound.  We injected Exparel . We reapproximated patient's thoracolumbar fascia with interrupted #1 Vicryl suture. We reapproximated patient's subcutaneous tissue with interrupted 2-0 Vicryl suture. The reapproximated patient's skin with Steri-Strips and benzoin. The wound was then coated with bacitracin ointment. A sterile dressing was applied. The drapes were removed. The patient was subsequently returned to the supine position where they were extubated by the anesthesia team. He was then transported to the post anesthesia care unit in stable condition. All sponge instrument and needle counts were reportedly correct at the end of this case.

## 2018-07-06 NOTE — H&P (Signed)
Subjective: The patient is a 73 year old white female on whom I previously performed a lumbar fusion.  She initially did well but has developed intractable back and leg pain.  She has failed medical management and was worked up with a lumbar myelo CT and lumbar x-rays.  These demonstrated degenerative disc disease at multiple levels and stenosis.  I discussed the various treatment options with her.  She has weighed the risks, benefits and alternatives surgery and decided proceed with a thoracolumbar decompression, instrumentation and fusion.  Past Medical History:  Diagnosis Date  . Anemia   . Arthritis   . Back pain   . Celiac disease   . Depression   . Diabetes mellitus without complication ([place])   . GERD (gastroesophageal reflux disease)   . Heart murmur   . MVP (mitral valve prolapse)   . RLS (restless legs syndrome)     Past Surgical History:  Procedure Laterality Date  . ABDOMINAL HYSTERECTOMY    . BACK SURGERY     spinal fusion  . CATARACT EXTRACTION W/ INTRAOCULAR LENS  IMPLANT, BILATERAL Bilateral   . [place]  . COLONOSCOPY WITH PROPOFOL N/A 02/04/2018   Procedure: COLONOSCOPY WITH PROPOFOL;  Surgeon: Manya Silvas, MD;  Location[facility] ENDOSCOPY;  Service: Endoscopy;  Laterality: N/A;  . ESOPHAGOGASTRODUODENOSCOPY (EGD) WITH PROPOFOL N/A 02/04/2018   Procedure: ESOPHAGOGASTRODUODENOSCOPY (EGD) WITH PROPOFOL;  Surgeon: Manya Silvas, MD;  Location: [facility] ENDOSCOPY;  Service: Endoscopy;  Laterality: N/A;  . FOOT SURGERY Right 2007  . HARDWARE REMOVAL Right 01/14/2015   Procedure: HARDWARE REMOVAL/ REMOVAL OF 3 CANNULATED SCREWS FROM RIGHT HIP.;  Surgeon: Dereck Leep, MD;  Location: [facility] ORS;  Service: Orthopedics;  Laterality: Right;  . HIP FRACTURE SURGERY Right 2016  . JOINT REPLACEMENT Bilateral 2014   knees  . NECK SURGERY     fusion  . TOTAL HIP ARTHROPLASTY Right 01/14/2015   Procedure: TOTAL HIP ARTHROPLASTY;  Surgeon: Dereck Leep,  MD;  Location: [facility] ORS;  Service: Orthopedics;  Laterality: Right;    Allergies  Allergen Reactions  . Ambien [Zolpidem Tartrate] Other (See Comments)    Altered mental status  . Barley Grass Other (See Comments)    Celiac disease  . Wheat Bran Rash    Celiac disease  . Tape Other (See Comments)    Adhesive tape - tears skin  . Codeine Nausea Only  . Ibuprofen Other (See Comments) and Rash    GI upset " makes my stomach hurt"  . Simvastatin Rash  . Valium [Diazepam] Other (See Comments)    Sleep walks    Social History   Tobacco Use  . Smoking status: Never Smoker  . Smokeless tobacco: Never Used  Substance Use Topics  . Alcohol use: No    Family History  Problem Relation Age of Onset  . Cancer Father 31       Colon Cancer  . Breast cancer Father 61  . Breast cancer Sister   . Stroke Mother   . Heart disease Maternal Grandmother    Prior to Admission medications   Medication Sig Start Date End Date Taking? Authorizing Provider  amoxicillin (AMOXIL) 500 MG tablet Take 2,000 mg by mouth See admin instructions. Take 2000 mg by mouth prior to dental appointments   Yes [provider]  Biotin 10000 MCG TABS Take 20,000 mcg by mouth daily.    Yes [provider]  fexofenadine (ALLEGRA) 180 MG tablet Take 180 mg by  mouth daily.   Yes [provider]  fluticasone (FLONASE) 50 MCG/ACT nasal spray Place 2 sprays into both nostrils daily.  12/13/17  Yes [provider]  Glucosamine-Chondroitin 750-600 MG TABS Take 2 tablets by mouth daily.    Yes [provider]  levothyroxine (SYNTHROID, LEVOTHROID) 50 MCG tablet Take 50 mcg by mouth daily before breakfast.  09/09/17 09/09/18 Yes [provider]  Magnesium 500 MG CAPS Take 500 mg by mouth daily.    Yes [provider]  metFORMIN (GLUCOPHAGE-XR) 500 MG 24 hr tablet Take 500 mg by mouth daily with breakfast.  09/09/17 09/09/18 Yes [provider]  pramipexole  (MIRAPEX) 1 MG tablet Take 1 mg by mouth 2 (two) times daily.  06/28/14  Yes [provider]     Review of Systems  Positive ROS: As above  All other systems have been reviewed and were otherwise negative with the exception of those mentioned in the HPI and as above.  Objective: Vital signs in last 24 hours: Temp:  [98.3 F (36.8 C)] 98.3 F (36.8 C) (01/15 0647) Pulse Rate:  [84] 84 (01/15 0647) Resp:  [18] 18 (01/15 0647) BP: (179)/(76) 179/76 (01/15 0647) SpO2:  [99 %] 99 % (01/15 0647) Estimated body mass index is 29.8 kg/m as calculated from the following:   Height as of 06/29/18: 5' 8"  (1.727 m).   Weight as of 06/29/18: 88.9 kg.   General Appearance: Alert Head: Normocephalic, without obvious abnormality, atraumatic Eyes: PERRL, conjunctiva/corneas clear, EOM's intact,    Ears: Normal  Throat: Normal  Neck: Supple, Back: The patient's lumbar incision is well-healed. Lungs: Clear to auscultation bilaterally, respirations unlabored Heart: Regular rate and rhythm, no murmur, rub or gallop Abdomen: Soft, non-tender Extremities: Extremities normal, atraumatic, no cyanosis or edema Skin: unremarkable  NEUROLOGIC:   Mental status: alert and oriented,Motor Exam - grossly normal Sensory Exam - grossly normal Reflexes:  Coordination - grossly normal Gait -she presents in a wheelchair. Balance - grossly normal Cranial Nerves: I: smell Not tested  II: visual acuity  OS: Normal  OD: Normal   II: visual fields Full to confrontation  II: pupils Equal, round, reactive to light  III,VII: ptosis None  III,IV,VI: extraocular muscles  Full ROM  V: mastication Normal  V: facial light touch sensation  Normal  V,VII: corneal reflex  Present  VII: facial muscle function - upper  Normal  VII: facial muscle function - lower Normal  VIII: hearing Not tested  IX: soft palate elevation  Normal  IX,X: gag reflex Present  XI: trapezius strength  5/5  XI: sternocleidomastoid  strength 5/5  XI: neck flexion strength  5/5  XII: tongue strength  Normal    Data Review Lab Results  Component Value Date   WBC 6.0 06/29/2018   HGB 13.1 06/29/2018   HCT 40.1 06/29/2018   MCV 92.2 06/29/2018   PLT 321 06/29/2018   Lab Results  Component Value Date   NA 135 06/29/2018   K 3.4 (L) 06/29/2018   CL 102 06/29/2018   CO2 25 06/29/2018   BUN 8 06/29/2018   CREATININE 0.94 06/29/2018   GLUCOSE 170 (H) 06/29/2018   Lab Results  Component Value Date   INR 0.97 01/09/2015    Assessment/Plan: Lumbar lumbar sacral degenerative disc disease, spinal stenosis, lumbago, lumbar radiculopathy, neurogenic claudication: I have discussed the situation with the patient.  I reviewed her imaging studies with her and pointed out the abnormalities.  We have discussed the  various treatment options including surgery.  I have described the surgical treatment option of a compression, instrumentation and fusion from T10 to the ilium.  I have shown her surgical models.  I have given her a surgical pamphlet.  We have discussed the risks, benefits, alternatives, expected postoperative course, and likelihood of achieving our goals with surgery.  I have answered all her, and her family's questions.  She has decided to proceed with surgery.   Ophelia Charter 07/06/2018 8:16 AM

## 2018-07-06 NOTE — Progress Notes (Signed)
Subjective: The patient is alert and pleasant.  He looks well.  Objective: Vital signs in last 24 hours: Temp:  [97.7 F (36.5 C)-98.3 F (36.8 C)] 97.7 F (36.5 C) (01/15 2027) Pulse Rate:  [84-117] 117 (01/15 2027) Resp:  [18-24] 24 (01/15 2027) BP: (167-179)/(76-130) 167/130 (01/15 2027) SpO2:  [93 %-99 %] 93 % (01/15 2027) Estimated body mass index is 29.8 kg/m as calculated from the following:   Height as of 06/29/18: 5' 8"  (1.727 m).   Weight as of 06/29/18: 88.9 kg.   Intake/Output from previous day: No intake/output data recorded. Intake/Output this shift: Total I/O In: 500 [I.V.:500] Out: 50 [Urine:50]  Physical exam the patient is alert and pleasant.  Her lower extremity strength is grossly normal.  Lab Results: No results for input(s): WBC, HGB, HCT, PLT in the last 72 hours. BMET No results for input(s): NA, K, CL, CO2, GLUCOSE, BUN, CREATININE, CALCIUM in the last 72 hours.  Studies/Results: No results found.  Assessment/Plan: The patient is doing well.  I spoke with her family.  LOS: 0 days     Ophelia Charter 07/06/2018, 8:47 PM

## 2018-07-06 NOTE — Anesthesia Procedure Notes (Signed)
Arterial Line Insertion Start/End1/15/2020 7:10 AM, 07/06/2018 7:20 AM Performed by: Lidia Collum, MD, Raenette Rover, CRNA, CRNA  Patient location: Pre-op. Preanesthetic checklist: patient identified, IV checked, site marked, risks and benefits discussed, surgical consent and pre-op evaluation Lidocaine 1% used for infiltration Right, radial was placed Catheter size: 20 G Hand hygiene performed  and maximum sterile barriers used   Attempts: 2 Procedure performed without using ultrasound guided technique. Following insertion, Biopatch and dressing applied. Post procedure assessment: normal  Patient tolerated the procedure well with no immediate complications.

## 2018-07-06 NOTE — Anesthesia Postprocedure Evaluation (Signed)
Anesthesia Post Note  Patient: Valerie Duran  Procedure(s) Performed: POSTERIOR LUMBAR INTERBODY FUSION, INTERBODY PROSTHESIS, LUMBAR ONE- LUMBAR TWO, LUMBAR TWO- LUMBAR THREE, LUMBAR FIVE- SACRAL ONE, INSTRUMENTATION AND FUSION THORACIC TEN TO ILIUM (N/A Spine Lumbar) APPLICATION OF INTRAOPERATIVE CT SCAN (N/A )     Patient location during evaluation: PACU Anesthesia Type: General Level of consciousness: awake and alert Pain management: pain level controlled Vital Signs Assessment: post-procedure vital signs reviewed and stable Respiratory status: spontaneous breathing, nonlabored ventilation, respiratory function stable and patient connected to nasal cannula oxygen Cardiovascular status: blood pressure returned to baseline and stable Postop Assessment: no apparent nausea or vomiting Anesthetic complications: no    Last Vitals:  Vitals:   07/06/18 2115 07/06/18 2145  BP: (!) 147/70 (!) 141/74  Pulse: (!) 129 (!) 118  Resp: 20 (!) 22  Temp: 37.4 C 37.1 C  SpO2: 95% 98%    Last Pain:  Vitals:   07/06/18 2145  TempSrc: Axillary  PainSc: 2                  Ethelda Deangelo COKER

## 2018-07-06 NOTE — Anesthesia Procedure Notes (Signed)
Procedure Name: Intubation Date/Time: 07/06/2018 8:43 AM Performed by: Raenette Rover, CRNA Pre-anesthesia Checklist: Patient identified, Emergency Drugs available, Suction available and Patient being monitored Patient Re-evaluated:Patient Re-evaluated prior to induction Oxygen Delivery Method: Circle system utilized Preoxygenation: Pre-oxygenation with 100% oxygen Induction Type: IV induction Ventilation: Mask ventilation without difficulty Laryngoscope Size: Mac and 3 Grade View: Grade I Tube type: Oral Tube size: 7.0 mm Number of attempts: 1 Airway Equipment and Method: Stylet Placement Confirmation: ETT inserted through vocal cords under direct vision,  positive ETCO2,  breath sounds checked- equal and bilateral and CO2 detector Secured at: 22 cm Tube secured with: Tape Dental Injury: Teeth and Oropharynx as per pre-operative assessment

## 2018-07-07 DIAGNOSIS — M48062 Spinal stenosis, lumbar region with neurogenic claudication: Principal | ICD-10-CM

## 2018-07-07 LAB — CBC
HCT: 29.1 % — ABNORMAL LOW (ref 36.0–46.0)
Hemoglobin: 9.3 g/dL — ABNORMAL LOW (ref 12.0–15.0)
MCH: 29.3 pg (ref 26.0–34.0)
MCHC: 32 g/dL (ref 30.0–36.0)
MCV: 91.8 fL (ref 80.0–100.0)
Platelets: 208 10*3/uL (ref 150–400)
RBC: 3.17 MIL/uL — ABNORMAL LOW (ref 3.87–5.11)
RDW: 13 % (ref 11.5–15.5)
WBC: 13.1 10*3/uL — AB (ref 4.0–10.5)
nRBC: 0 % (ref 0.0–0.2)

## 2018-07-07 LAB — GLUCOSE, CAPILLARY
GLUCOSE-CAPILLARY: 189 mg/dL — AB (ref 70–99)
Glucose-Capillary: 133 mg/dL — ABNORMAL HIGH (ref 70–99)
Glucose-Capillary: 161 mg/dL — ABNORMAL HIGH (ref 70–99)
Glucose-Capillary: 98 mg/dL (ref 70–99)

## 2018-07-07 LAB — BASIC METABOLIC PANEL
Anion gap: 6 (ref 5–15)
BUN: 9 mg/dL (ref 8–23)
CO2: 25 mmol/L (ref 22–32)
Calcium: 8.1 mg/dL — ABNORMAL LOW (ref 8.9–10.3)
Chloride: 106 mmol/L (ref 98–111)
Creatinine, Ser: 0.88 mg/dL (ref 0.44–1.00)
GFR calc Af Amer: >60 mL/min (ref >60)
GFR calc non Af Amer: >60 mL/min (ref >60)
Glucose, Bld: 169 mg/dL — ABNORMAL HIGH (ref 70–99)
Potassium: 3.8 mmol/L (ref 3.5–5.1)
Sodium: 137 mmol/L (ref 135–145)

## 2018-07-07 LAB — POCT I-STAT 4, (NA,K, GLUC, HGB,HCT)
Glucose, Bld: 218 mg/dL — ABNORMAL HIGH (ref 70–99)
HCT: 27 % — ABNORMAL LOW (ref 36.0–46.0)
Hemoglobin: 9.2 g/dL — ABNORMAL LOW (ref 12.0–15.0)
Potassium: 4.5 mmol/L (ref 3.5–5.1)
Sodium: 139 mmol/L (ref 135–145)

## 2018-07-07 MED ORDER — LACTATED RINGERS IV SOLN
Freq: Once | INTRAVENOUS | Status: AC
  Administered 2018-07-07: 14:00:00 via INTRAVENOUS

## 2018-07-07 MED ORDER — FEXOFENADINE HCL 180 MG PO TABS
180.0000 mg | ORAL_TABLET | Freq: Every day | ORAL | Status: DC
  Administered 2018-07-07: 180 mg via ORAL
  Filled 2018-07-07 (×2): qty 1

## 2018-07-07 MED FILL — Gelatin Absorbable MT Powder: OROMUCOSAL | Qty: 1 | Status: AC

## 2018-07-07 MED FILL — Thrombin For Soln 5000 Unit: CUTANEOUS | Qty: 5000 | Status: AC

## 2018-07-07 NOTE — Progress Notes (Signed)
IP rehab admissions - I met with patient today.  She hopes to be able to discharge home with [facility] therapies.  I will follow progress for now.  I gave her rehab brochure to review.  Call me for questions.  [phone]

## 2018-07-07 NOTE — Progress Notes (Signed)
Upon assessment, pt's honeycomb dressing was saturated and the hemovac drain almost completely pulled out. Notified [facility], orders to pull the rest of the drain out and redress back dressing. Will continue to monitor.

## 2018-07-07 NOTE — Progress Notes (Signed)
Orthopedic Tech Progress Note Patient Details:  Valerie Duran Feb 02, 1946 [id number] Called up there and the nurse said she has brace and has worn it this morning and back in bed.  Patient ID: Valerie Duran, female   DOB: [birth year], 73 y.o.   MRN: [id number]   Janit Pagan 07/07/2018, 8:32 AM

## 2018-07-07 NOTE — Consult Note (Signed)
Physical Medicine and Rehabilitation Consult Reason for Consult:  Decreased functional mobility Referring Physician:  Dr. Arnoldo Morale   HPI: Valerie Duran is a 73 y.o.right handed female  With history of mitral valve prolapse,hip and knee replacement, diabetes mellitus, restless leg syndrome. Per chart review patient lives with spouse. Used a cane prior to admission. One level home. Husband can assist. Presented 07/06/2018 with history of lumbar fusion L3-4, 4-5 and now notes increasing low back pain. CT and lumbar myelogram demonstrated degenerative disc at multiple levels as well as stenosis/radiculopathy. Underwent bilateral L1-2, 2-3 and L5-S1 laminotomy foraminotomies medial facetectomy and and decompressionas well as posterior lateral arthrodesis at T10-11, 11-12, 12-L1, L1-2, 2-3 and L5-S1 07/06/2018 per Dr. Arnoldo Morale. Hospital course pain management. Acute blood loss anemia 9.3. Aspen lumbar brace when out of bed applied in sitting position. Therapy evaluations pending. M.D. has requested physical medicine rehabilitation consult.   Review of Systems  Constitutional: Negative for chills and fever.  HENT: Negative for hearing loss.   Eyes: Negative for blurred vision and double vision.  Respiratory: Negative for cough and shortness of breath.   Cardiovascular: Positive for palpitations and leg swelling.  Gastrointestinal: Positive for constipation. Negative for nausea and vomiting.       GERD  Genitourinary: Negative for dysuria, flank pain and hematuria.  Musculoskeletal: Positive for back pain and myalgias.  Skin: Negative for rash.  Neurological:       Restless leg syndrome  Psychiatric/Behavioral: Positive for depression.  All other systems reviewed and are negative.  Past Medical History:  Diagnosis Date  . Anemia   . Arthritis   . Back pain   . Celiac disease   . Depression   . Diabetes mellitus without complication ([place])   . GERD (gastroesophageal reflux disease)    . Heart murmur   . MVP (mitral valve prolapse)   . RLS (restless legs syndrome)    Past Surgical History:  Procedure Laterality Date  . ABDOMINAL HYSTERECTOMY    . BACK SURGERY     spinal fusion  . CATARACT EXTRACTION W/ INTRAOCULAR LENS  IMPLANT, BILATERAL Bilateral   . [place]  . COLONOSCOPY WITH PROPOFOL N/A 02/04/2018   Procedure: COLONOSCOPY WITH PROPOFOL;  Surgeon: Manya Silvas, MD;  Location[facility] ENDOSCOPY;  Service: Endoscopy;  Laterality: N/A;  . ESOPHAGOGASTRODUODENOSCOPY (EGD) WITH PROPOFOL N/A 02/04/2018   Procedure: ESOPHAGOGASTRODUODENOSCOPY (EGD) WITH PROPOFOL;  Surgeon: Manya Silvas, MD;  Location: [facility] ENDOSCOPY;  Service: Endoscopy;  Laterality: N/A;  . FOOT SURGERY Right 2007  . HARDWARE REMOVAL Right 01/14/2015   Procedure: HARDWARE REMOVAL/ REMOVAL OF 3 CANNULATED SCREWS FROM RIGHT HIP.;  Surgeon: Dereck Leep, MD;  Location: [facility] ORS;  Service: Orthopedics;  Laterality: Right;  . HIP FRACTURE SURGERY Right 2016  . JOINT REPLACEMENT Bilateral 2014   knees  . NECK SURGERY     fusion  . TOTAL HIP ARTHROPLASTY Right 01/14/2015   Procedure: TOTAL HIP ARTHROPLASTY;  Surgeon: Dereck Leep, MD;  Location: [facility] ORS;  Service: Orthopedics;  Laterality: Right;   Family History  Problem Relation Age of Onset  . Cancer Father 42       Colon Cancer  . Breast cancer Father 49  . Breast cancer Sister   . Stroke Mother   . Heart disease Maternal Grandmother    Social History:  reports that she has never smoked. She has never used smokeless tobacco. She reports that she does not  drink alcohol or use drugs. Allergies:  Allergies  Allergen Reactions  . Ambien [Zolpidem Tartrate] Other (See Comments)    Altered mental status  . Barley Grass Other (See Comments)    Celiac disease  . Wheat Bran Rash    Celiac disease  . Tape Other (See Comments)    Adhesive tape - tears skin  . Codeine Nausea Only  . Ibuprofen Other (See Comments)  and Rash    GI upset " makes my stomach hurt"  . Simvastatin Rash  . Valium [Diazepam] Other (See Comments)    Sleep walks   Medications Prior to Admission  Medication Sig Dispense Refill  . amoxicillin (AMOXIL) 500 MG tablet Take 2,000 mg by mouth See admin instructions. Take 2000 mg by mouth prior to dental appointments    . Biotin 10000 MCG TABS Take 20,000 mcg by mouth daily.     . fexofenadine (ALLEGRA) 180 MG tablet Take 180 mg by mouth daily.    . fluticasone (FLONASE) 50 MCG/ACT nasal spray Place 2 sprays into both nostrils daily.     . Glucosamine-Chondroitin 750-600 MG TABS Take 2 tablets by mouth daily.     Marland Kitchen levothyroxine (SYNTHROID, LEVOTHROID) 50 MCG tablet Take 50 mcg by mouth daily before breakfast.     . Magnesium 500 MG CAPS Take 500 mg by mouth daily.     . metFORMIN (GLUCOPHAGE-XR) 500 MG 24 hr tablet Take 500 mg by mouth daily with breakfast.     . pramipexole (MIRAPEX) 1 MG tablet Take 1 mg by mouth 2 (two) times daily.       Home:    Functional History:   Functional Status:  Mobility:          ADL:    Cognition: Cognition Orientation Level: Oriented X4    Blood pressure (!) 101/58, pulse (!) 103, temperature 98.1 F (36.7 C), temperature source Oral, resp. rate 16, SpO2 100 %. Physical Exam  Vitals reviewed. Constitutional: She appears well-developed.  HENT:  Head: Normocephalic.  Neck: Normal range of motion.  Cardiovascular: Normal rate.  Respiratory: Effort normal.  Musculoskeletal:        General: Tenderness present.  Neurological: She displays normal reflexes. She exhibits normal muscle tone.  Patient is alert. Follows full commands. Provides her name, age and date of birth. UE 4/5. LE 3-4/5. No focal weakness. Normal sensory exam.   Skin: Skin is warm.  Psychiatric: She has a normal mood and affect. Her behavior is normal.    Results for orders placed or performed during the hospital encounter of 07/06/18 (from the past 24 hour(s))   Glucose, capillary     Status: Abnormal   Collection Time: 07/06/18  6:44 AM  Result Value Ref Range   Glucose-Capillary 134 (H) 70 - 99 mg/dL  Glucose, capillary     Status: Abnormal   Collection Time: 07/06/18 12:30 PM  Result Value Ref Range   Glucose-Capillary 170 (H) 70 - 99 mg/dL  Glucose, capillary     Status: Abnormal   Collection Time: 07/06/18  8:40 PM  Result Value Ref Range   Glucose-Capillary 204 (H) 70 - 99 mg/dL  Glucose, capillary     Status: Abnormal   Collection Time: 07/06/18  9:46 PM  Result Value Ref Range   Glucose-Capillary 267 (H) 70 - 99 mg/dL  MRSA PCR Screening     Status: None   Collection Time: 07/06/18  9:51 PM  Result Value Ref Range   MRSA by PCR NEGATIVE  NEGATIVE  Hemoglobin A1c     Status: Abnormal   Collection Time: 07/06/18  9:56 PM  Result Value Ref Range   Hgb A1c MFr Bld 7.1 (H) 4.8 - 5.6 %   Mean Plasma Glucose 157.07 mg/dL   Dg Lumbar Spine 1 View  Result Date: 07/06/2018 CLINICAL DATA:  POSTERIOR LUMBAR INTERBODY FUSION, INTERBODY PROSTHESIS, LUMBAR ONE- LUMBAR TWO, LUMBAR TWO- LUMBAR THREE, LUMBAR FIVE- SACRAL ONE, INSTRUMENTATION AND FUSION THORACIC TEN TO ILIUM. EXAM: DG C-ARM 61-120 MIN; LUMBAR SPINE - 1 VIEW COMPARISON:  Lumbar CT, 07/06/2018 and 03/11/2018. FINDINGS: The single spot fluoro graphic image of the lumbar spine demonstrates metallic interbody prosthesis between L1 on L2 and L2 on L3, new since the CT dated 03/11/2018. The interbody prostheses are well centered. Pedicle screws and lucent intervertebral spacers, L3 through L5, are stable from the prior studies. IMPRESSION: 1. Intraoperative imaging provided for placement of interbody prostheses, L1-L2 and L2-L3. Electronically Signed   By: Lajean Manes M.D.   On: 07/06/2018 20:54   Dg C-arm 1-60 Min  Result Date: 07/06/2018 CLINICAL DATA:  POSTERIOR LUMBAR INTERBODY FUSION, INTERBODY PROSTHESIS, LUMBAR ONE- LUMBAR TWO, LUMBAR TWO- LUMBAR THREE, LUMBAR FIVE- SACRAL ONE,  INSTRUMENTATION AND FUSION THORACIC TEN TO ILIUM. EXAM: DG C-ARM 61-120 MIN; LUMBAR SPINE - 1 VIEW COMPARISON:  Lumbar CT, 07/06/2018 and 03/11/2018. FINDINGS: The single spot fluoro graphic image of the lumbar spine demonstrates metallic interbody prosthesis between L1 on L2 and L2 on L3, new since the CT dated 03/11/2018. The interbody prostheses are well centered. Pedicle screws and lucent intervertebral spacers, L3 through L5, are stable from the prior studies. IMPRESSION: 1. Intraoperative imaging provided for placement of interbody prostheses, L1-L2 and L2-L3. Electronically Signed   By: Lajean Manes M.D.   On: 07/06/2018 20:54     Assessment/Plan: Diagnosis: multiple level lumbar stenosis with radiculopathy, prior lumbar surgery. Pt s/p multi-level decompression and fusion 1. Does the need for close, 24 hr/day medical supervision in concert with the patient's rehab needs make it unreasonable for this patient to be served in a less intensive setting? Yes and Potentially 2. Co-Morbidities requiring supervision/potential complications: pain control, wound care, post-op anemia 3. Due to bladder management, bowel management, safety, skin/wound care, disease management, medication administration, pain management and patient education, does the patient require 24 hr/day rehab nursing? Yes and Potentially 4. Does the patient require coordinated care of a physician, rehab nurse, PT (1-2 hrs/day, 5 days/week) and OT (1-2 hrs/day, 5 days/week) to address physical and functional deficits in the context of the above medical diagnosis(es)? Yes and Potentially Addressing deficits in the following areas: balance, endurance, locomotion, strength, transferring, bowel/bladder control, bathing, dressing, feeding, grooming, toileting and psychosocial support 5. Can the patient actively participate in an intensive therapy program of at least 3 hrs of therapy per day at least 5 days per week? Yes 6. The potential for  patient to make measurable gains while on inpatient rehab is good 7. Anticipated functional outcomes upon discharge from inpatient rehab are modified independent and min assist  with PT, modified independent and min assist with OT, n/a with SLP. 8. Estimated rehab length of stay to reach the above functional goals is: potentially 7-12 days 9. Anticipated D/C setting: Home 10. Anticipated post D/C treatments: HH therapy and Outpatient therapy 11. Overall Rehab/Functional Prognosis: excellent  RECOMMENDATIONS: This patient's condition is appropriate for continued rehabilitative care in the following setting: see below Patient has agreed to participate in recommended program. Yes Note that  insurance prior authorization may be required for reimbursement for recommended care.  Comment: Pt prefers to go home but understands that given the magnitude of her surgery that she may need therapy first (CIR) before going home. Rehab Admissions Coordinator to follow up.  Thanks,  Meredith Staggers, MD, Mellody Drown  I have personally performed a face to face diagnostic evaluation of this patient. Additionally, I have reviewed and concur with the physician assistant's documentation above.    Lavon Paganini Angiulli, PA-C 07/07/2018

## 2018-07-07 NOTE — Evaluation (Signed)
Physical Therapy Evaluation Patient Details Name: Valerie Duran MRN: [id number] DOB: September 27, 1945 Today's Date: 07/07/2018   History of Present Illness  73 yo admitted for T10-S1 PLIF. 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, arthritis, celiac disease, depression, DM, RLS, bil TKA, Rt THA  Clinical Impression  Pt pleasant sitting in chair with friend on arrival and reports she usually sees spots before passing out and noted dizziness in standing this session with orthostatic hypotension. Pt mobility limited to pivot transfers to void and return to bed due to BP. Pt moving well overall and anticipate she will be able to return home with spouse assist once hypotension resolved. Pt educated for brace wear, back precautions and transfers. She demonstrates decreased activity tolerance, mobility and function who will benefit from acute therapy to maximize mobility adhering to precautions.   BP sitting 87/60 standing 60/38 Supine 94/74 HR 90-94    Follow Up Recommendations Home health PT;Supervision/Assistance - 24 hour    Equipment Recommendations  None recommended by PT    Recommendations for Other Services       Precautions / Restrictions Precautions Precautions: Fall;Back Precaution Booklet Issued: Yes (comment) Precaution Comments: watch BP Required Braces or Orthoses: Spinal Brace Spinal Brace: Applied in sitting position;Lumbar corset      Mobility  Bed Mobility Overal bed mobility: Needs Assistance Bed Mobility: Sit to Sidelying;Rolling Rolling: Min assist       Sit to sidelying: Mod assist;+2 for safety/equipment General bed mobility comments: cues for sequence with rail to prevent twisting, sit to sidely assist to bring legs to surface with cues for precautions  Transfers Overall transfer level: Needs assistance   Transfers: Sit to/from Stand;Stand Pivot Transfers Sit to Stand: Min assist;+2 safety/equipment Stand pivot transfers: Min assist;+2 safety/equipment       General  transfer comment: min assist to stand from recliner with pt reporting dizziness in static standing with BP dropping to 60/38 . Pt returned to recliner and fully reclined to recover BP prior to pivot chair>BSC>bed with mobility limited by BP   Ambulation/Gait             General Gait Details: unable to attempt due to BP  Stairs            Wheelchair Mobility    Modified Rankin (Stroke Patients Only)       Balance Overall balance assessment: Mild deficits observed, not formally tested                                           Pertinent Vitals/Pain Pain Assessment: 0-10 Pain Score: 5  Pain Location: incision Pain Descriptors / Indicators: Aching;Sore Pain Intervention(s): Limited activity within patient's tolerance;Premedicated before session;Monitored during session;Repositioned    Home Living Family/patient expects to be discharged to:: Private residence Living Arrangements: Spouse/significant other Available Help at Discharge: Family Type of Home: House Home Access: Level entry     Home Layout: One level Home Equipment: Shower seat - built in;Cane - single point;Walker - 2 wheels;Wheelchair - manual      Prior Function Level of Independence: Independent         Comments: used cane out of the house on uneven ground     Hand Dominance        Extremity/Trunk Assessment   Upper Extremity Assessment Upper Extremity Assessment: Defer to OT evaluation    Lower Extremity Assessment Lower Extremity Assessment: Generalized weakness  Cervical / Trunk Assessment Cervical / Trunk Assessment: Other exceptions Cervical / Trunk Exceptions: post surgical  Communication   Communication: No difficulties  Cognition Arousal/Alertness: Awake/alert Behavior During Therapy: WFL for tasks assessed/performed Overall Cognitive Status: Within Functional Limits for tasks assessed                                        General  Comments      Exercises     Assessment/Plan    PT Assessment Patient needs continued PT services  PT Problem List Decreased activity tolerance;Decreased safety awareness;Decreased mobility;Decreased knowledge of use of DME;Cardiopulmonary status limiting activity;Decreased knowledge of precautions       PT Treatment Interventions Gait training;Therapeutic activities;Therapeutic exercise;DME instruction;Functional mobility training;Balance training;Patient/family education    PT Goals (Current goals can be found in the Care Plan section)  Acute Rehab PT Goals Patient Stated Goal: return home PT Goal Formulation: With patient Time For Goal Achievement: 07/21/18 Potential to Achieve Goals: Good    Frequency Min 5X/week   Barriers to discharge        Co-evaluation               AM-PAC PT "6 Clicks" Mobility  Outcome Measure Help needed turning from your back to your side while in a flat bed without using bedrails?: A Little Help needed moving from lying on your back to sitting on the side of a flat bed without using bedrails?: A Little Help needed moving to and from a bed to a chair (including a wheelchair)?: A Little Help needed standing up from a chair using your arms (e.g., wheelchair or bedside chair)?: A Little Help needed to walk in hospital room?: A Little Help needed climbing 3-5 steps with a railing? : A Little 6 Click Score: 18    End of Session Equipment Utilized During Treatment: Back brace;Gait belt Activity Tolerance: Treatment limited secondary to medical complications (Comment)(hypotension) Patient left: in bed;with call bell/phone within reach;with family/visitor present Nurse Communication: Mobility status;Precautions PT Visit Diagnosis: Other abnormalities of gait and mobility (R26.89)    Time: 5631-4970 PT Time Calculation (min) (ACUTE ONLY): 26 min   Charges:   PT Evaluation $PT Eval Moderate Complexity: 1 Mod          [place], PT Acute Rehabilitation Services Pager: [phone] Office: [place] 07/07/2018, 12:56 PM

## 2018-07-07 NOTE — Progress Notes (Signed)
Subjective: The patient is alert and pleasant.  She looks well.  Her back is sore of course.  Her husband is at the bedside.  Objective: Vital signs in last 24 hours: Temp:  [97.7 F (36.5 C)-99.3 F (37.4 C)] 98.9 F (37.2 C) (01/16 0800) Pulse Rate:  [90-129] 90 (01/16 1300) Resp:  [12-26] 19 (01/16 1300) BP: (60-182)/(38-130) 94/74 (01/16 1244) SpO2:  [91 %-100 %] 94 % (01/16 1300) Estimated body mass index is 29.8 kg/m as calculated from the following:   Height as of 06/29/18: 5' 8"  (1.727 m).   Weight as of 06/29/18: 88.9 kg.   Intake/Output from previous day: 01/15 0701 - 01/16 0700 In: 3883.9 [I.V.:3083.9; Blood:200; IV Piggyback:600] Out: 2264 [EGBTD:1761; Drains:365; Blood:700] Intake/Output this shift: Total I/O In: 245.3 [P.O.:240; I.V.:5.3] Out: 200 [Urine:200]  Physical exam the patient is alert and oriented.  Her strength is grossly normal.  Lab Results: Recent Labs    07/06/18 2018 07/07/18 0539  WBC  --  13.1*  HGB 9.2* 9.3*  HCT 27.0* 29.1*  PLT  --  208   BMET Recent Labs    07/06/18 2018 07/07/18 0539  NA 139 137  K 4.5 3.8  CL  --  106  CO2  --  25  GLUCOSE 218* 169*  BUN  --  9  CREATININE  --  0.88  CALCIUM  --  8.1*    Studies/Results: Dg Lumbar Spine 1 View  Result Date: 07/06/2018 CLINICAL DATA:  POSTERIOR LUMBAR INTERBODY FUSION, INTERBODY PROSTHESIS, LUMBAR ONE- LUMBAR TWO, LUMBAR TWO- LUMBAR THREE, LUMBAR FIVE- SACRAL ONE, INSTRUMENTATION AND FUSION THORACIC TEN TO ILIUM. EXAM: DG C-ARM 61-120 MIN; LUMBAR SPINE - 1 VIEW COMPARISON:  Lumbar CT, 07/06/2018 and 03/11/2018. FINDINGS: The single spot fluoro graphic image of the lumbar spine demonstrates metallic interbody prosthesis between L1 on L2 and L2 on L3, new since the CT dated 03/11/2018. The interbody prostheses are well centered. Pedicle screws and lucent intervertebral spacers, L3 through L5, are stable from the prior studies. IMPRESSION: 1. Intraoperative imaging provided for  placement of interbody prostheses, L1-L2 and L2-L3. Electronically Signed   By: Lajean Manes M.D.   On: 07/06/2018 20:54   Dg C-arm 1-60 Min  Result Date: 07/06/2018 CLINICAL DATA:  POSTERIOR LUMBAR INTERBODY FUSION, INTERBODY PROSTHESIS, LUMBAR ONE- LUMBAR TWO, LUMBAR TWO- LUMBAR THREE, LUMBAR FIVE- SACRAL ONE, INSTRUMENTATION AND FUSION THORACIC TEN TO ILIUM. EXAM: DG C-ARM 61-120 MIN; LUMBAR SPINE - 1 VIEW COMPARISON:  Lumbar CT, 07/06/2018 and 03/11/2018. FINDINGS: The single spot fluoro graphic image of the lumbar spine demonstrates metallic interbody prosthesis between L1 on L2 and L2 on L3, new since the CT dated 03/11/2018. The interbody prostheses are well centered. Pedicle screws and lucent intervertebral spacers, L3 through L5, are stable from the prior studies. IMPRESSION: 1. Intraoperative imaging provided for placement of interbody prostheses, L1-L2 and L2-L3. Electronically Signed   By: Lajean Manes M.D.   On: 07/06/2018 20:54    Assessment/Plan: Status post thoracolumbar decompression, instrumentation and fusion.  The patient is doing well except for some mild orthostatic hypotension.  We will increase her fluids.  We will mobilize her with PT and OT.  She has been evaluated by the rehab team.  LOS: 1 day     Valerie Duran 07/07/2018, 1:38 PM

## 2018-07-08 LAB — GLUCOSE, CAPILLARY
Glucose-Capillary: 214 mg/dL — ABNORMAL HIGH (ref 70–99)
Glucose-Capillary: 136 mg/dL — ABNORMAL HIGH (ref 70–99)
Glucose-Capillary: 149 mg/dL — ABNORMAL HIGH (ref 70–99)
Glucose-Capillary: 159 mg/dL — ABNORMAL HIGH (ref 70–99)

## 2018-07-08 NOTE — Progress Notes (Signed)
Subjective: The patient is alert and pleasant.  She looks well.  Her back is appropriately sore.  Objective: Vital signs in last 24 hours: Temp:  [98.3 F (36.8 C)-98.8 F (37.1 C)] 98.8 F (37.1 C) (01/17 0700) Pulse Rate:  [89-113] 113 (01/17 0600) Resp:  [14-27] 19 (01/17 0600) BP: (60-149)/(38-83) 119/80 (01/17 0600) SpO2:  [91 %-100 %] 100 % (01/17 0600) Estimated body mass index is 29.8 kg/m as calculated from the following:   Height as of 06/29/18: 5' 8"  (1.727 m).   Weight as of 06/29/18: 88.9 kg.   Intake/Output from previous day: 01/16 0701 - 01/17 0700 In: 855.3 [P.O.:680; I.V.:175.3] Out: 8115 [Urine:1625; Drains:130] Intake/Output this shift: No intake/output data recorded.  Physical exam the patient is alert and oriented.  She is moving her lower extremities well.  Lab Results: Recent Labs    07/06/18 2018 07/07/18 0539  WBC  --  13.1*  HGB 9.2* 9.3*  HCT 27.0* 29.1*  PLT  --  208   BMET Recent Labs    07/06/18 2018 07/07/18 0539  NA 139 137  K 4.5 3.8  CL  --  106  CO2  --  25  GLUCOSE 218* 169*  BUN  --  9  CREATININE  --  0.88  CALCIUM  --  8.1*    Studies/Results: Dg Lumbar Spine 1 View  Result Date: 07/06/2018 CLINICAL DATA:  POSTERIOR LUMBAR INTERBODY FUSION, INTERBODY PROSTHESIS, LUMBAR ONE- LUMBAR TWO, LUMBAR TWO- LUMBAR THREE, LUMBAR FIVE- SACRAL ONE, INSTRUMENTATION AND FUSION THORACIC TEN TO ILIUM. EXAM: DG C-ARM 61-120 MIN; LUMBAR SPINE - 1 VIEW COMPARISON:  Lumbar CT, 07/06/2018 and 03/11/2018. FINDINGS: The single spot fluoro graphic image of the lumbar spine demonstrates metallic interbody prosthesis between L1 on L2 and L2 on L3, new since the CT dated 03/11/2018. The interbody prostheses are well centered. Pedicle screws and lucent intervertebral spacers, L3 through L5, are stable from the prior studies. IMPRESSION: 1. Intraoperative imaging provided for placement of interbody prostheses, L1-L2 and L2-L3. Electronically Signed   By:  Lajean Manes M.D.   On: 07/06/2018 20:54   Dg C-arm 1-60 Min  Result Date: 07/06/2018 CLINICAL DATA:  POSTERIOR LUMBAR INTERBODY FUSION, INTERBODY PROSTHESIS, LUMBAR ONE- LUMBAR TWO, LUMBAR TWO- LUMBAR THREE, LUMBAR FIVE- SACRAL ONE, INSTRUMENTATION AND FUSION THORACIC TEN TO ILIUM. EXAM: DG C-ARM 61-120 MIN; LUMBAR SPINE - 1 VIEW COMPARISON:  Lumbar CT, 07/06/2018 and 03/11/2018. FINDINGS: The single spot fluoro graphic image of the lumbar spine demonstrates metallic interbody prosthesis between L1 on L2 and L2 on L3, new since the CT dated 03/11/2018. The interbody prostheses are well centered. Pedicle screws and lucent intervertebral spacers, L3 through L5, are stable from the prior studies. IMPRESSION: 1. Intraoperative imaging provided for placement of interbody prostheses, L1-L2 and L2-L3. Electronically Signed   By: Lajean Manes M.D.   On: 07/06/2018 20:54    Assessment/Plan: Postop day #2: The patient is progressing well.  Her orthostatic hypotension is resolving.  We will continue to mobilize her with PT and OT.  LOS: 2 days     Ophelia Charter 07/08/2018, 8:31 AM

## 2018-07-08 NOTE — Progress Notes (Signed)
Physical Therapy Treatment Patient Details Name: Valerie Duran MRN: [id number] DOB: 01/12/1946 Today's Date: 07/08/2018    History of Present Illness 73 yo admitted for T10-S1 PLIF. 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 Pt with continued drop in BP with standing but much more stable pressure than on eval. Pt able to state all precautions with min assist to don brace EOB. Pt with very limited assist for transfers and gait with return home still appropriate. Pt encouraged to mobilize with nursing with brace and educated for sitting limits.   BP supine 95/82 (88), HR 114 Sitting 128/76 (91), HR 118 Standing 110/55 (71), HR 125 End of session sitting 123/56 (67), HR 126 95% SpO2 on Ra    Follow Up Recommendations  Home health PT;Supervision/Assistance - 24 hour     Equipment Recommendations  None recommended by PT    Recommendations for Other Services       Precautions / Restrictions Precautions Precautions: Fall;Back Precaution Comments: watch BP Required Braces or Orthoses: Spinal Brace Spinal Brace: Applied in sitting position;Lumbar corset    Mobility  Bed Mobility Overal bed mobility: Needs Assistance Bed Mobility: Rolling;Sidelying to Sit Rolling: Supervision Sidelying to sit: Min guard       General bed mobility comments: cues for sequence with tactile cues for elevating trunk  Transfers Overall transfer level: Needs assistance   Transfers: Sit to/from Stand Sit to Stand: Min guard         General transfer comment: cues for hand placement, posture and safety  Ambulation/Gait Ambulation/Gait assistance: Min guard Gait Distance (Feet): 150 Feet Assistive device: Rolling walker (2 wheeled) Gait Pattern/deviations: Step-through pattern;Decreased stride length   Gait velocity interpretation: >2.62 ft/sec, indicative of community ambulatory General  Gait Details: good stability with use of Rw with cues for direction, pt with slight dizziness but BP tolerant of mobility and chair follow but not needed   Stairs             Wheelchair Mobility    Modified Rankin (Stroke Patients Only)       Balance Overall balance assessment: Mild deficits observed, not formally tested                                          Cognition Arousal/Alertness: Awake/alert Behavior During Therapy: WFL for tasks assessed/performed Overall Cognitive Status: Within Functional Limits for tasks assessed                                        Exercises      General Comments        Pertinent Vitals/Pain Pain Score: 4  Pain Location: incision Pain Descriptors / Indicators: Aching;Sore Pain Intervention(s): Limited activity within patient's tolerance;Monitored during session;Repositioned    Home Living                      Prior Function            PT Goals (current goals can now be found in the care plan section) Progress towards PT goals: Progressing toward goals    Frequency           PT Plan Current plan  remains appropriate    Co-evaluation              AM-PAC PT "6 Clicks" Mobility   Outcome Measure  Help needed turning from your back to your side while in a flat bed without using bedrails?: A Little Help needed moving from lying on your back to sitting on the side of a flat bed without using bedrails?: A Little Help needed moving to and from a bed to a chair (including a wheelchair)?: A Little Help needed standing up from a chair using your arms (e.g., wheelchair or bedside chair)?: A Little Help needed to walk in hospital room?: A Little Help needed climbing 3-5 steps with a railing? : A Little 6 Click Score: 18    End of Session Equipment Utilized During Treatment: Back brace Activity Tolerance: Patient tolerated treatment well Patient left: in chair;with call  bell/phone within reach;with chair alarm set Nurse Communication: Mobility status;Precautions PT Visit Diagnosis: Other abnormalities of gait and mobility (R26.89)     Time: 0315-9458 PT Time Calculation (min) (ACUTE ONLY): 23 min  Charges:  $Gait Training: 8-22 mins $Therapeutic Activity: 8-22 mins                     [place] Pager: [phone] Office: [place] 07/08/2018, 11:05 AM

## 2018-07-08 NOTE — Progress Notes (Signed)
Occupational Therapy Evaluation (late entry)  Pt presents to OT with the below listed deficits.   She currently requires min A for UB ADLs and max A for LB ADLs, and min A +2 for functional transfers.  Pt with symptomatic orthostatic hypotension during eval which limited her activity.  PTA, she lived with spouse and was fully independent.  Pt reports spouse will be able to assist her at discharge.  Anticipate she will progress well enough to return home with spouse and [place].  No DME recommended.  BP sitting 87/60 standing 60/38 Supine 94/74 HR 91-94    07/07/18 1300  OT Visit Information  Last OT Received On 07/08/18  Assistance Needed +2  PT/OT/SLP Co-Evaluation/Treatment Yes (for safety with gait)  Reason for Co-Treatment For patient/therapist safety;To address functional/ADL transfers  OT goals addressed during session ADL's and self-care  History of Present Illness 73 yo admitted for T10-S1 PLIF. 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, arthritis, celiac disease, depression, DM, RLS, bil TKA, Rt THA  Precautions  Precautions Fall;Back  Precaution Booklet Issued Yes (comment)  Precaution Comments watch BP  Required Braces or Orthoses Spinal Brace  Spinal Brace Applied in sitting position;Lumbar corset  Home Living  Family/patient expects to be discharged to: Private residence  Living Arrangements Spouse/significant other  Available Help at Discharge Family  Type of [place] Access Level entry  [place] One level  Bathroom Shower/Tub Walk-in shower  [place] - built in;Cane - single point;Walker - 2 wheels;Wheelchair - Advice worker aid  Prior Function  Level of Independence Independent  Comments used cane out of the house on uneven ground  Communication  Communication No difficulties  Pain Assessment  Pain Assessment 0-10  Pain Score 5  Pain Location incision  Pain Descriptors /  Indicators Aching;Sore  Pain Intervention(s) Monitored during session;Limited activity within patient's tolerance  Cognition  Arousal/Alertness Awake/alert  Behavior During Therapy WFL for tasks assessed/performed  Overall Cognitive Status Within Functional Limits for tasks assessed  Upper Extremity Assessment  Upper Extremity Assessment Overall WFL for tasks assessed  Lower Extremity Assessment  Lower Extremity Assessment Defer to PT evaluation  Cervical / Trunk Assessment  Cervical / Trunk Assessment Other exceptions  Cervical / Trunk Exceptions post surgical  ADL  Overall ADL's  Needs assistance/impaired  Eating/Feeding Independent  Grooming Wash/dry hands;Wash/dry face;Oral care;Brushing hair;Set up;Sitting  Upper Body Bathing Set up;Supervision/ safety;Sitting  Lower Body Bathing Maximal assistance;Sit to/from stand  Upper Body Dressing  Minimal assistance;Sitting  Lower Body Dressing Total assistance;Sit to/from Engineer, manufacturing for safety/equipment;Stand-pivot;BSC  Toilet Transfer Details (indicate cue type and reason) +2 assist required due to orthostatic hypotension   Toileting- Clothing Manipulation and Hygiene Total assistance;Sit to/from stand  Functional mobility during ADLs Minimal assistance;+2 for physical assistance;+2 for safety/equipment;Rolling walker  General ADL Comments Pt reports she was unable to cross Rt LE over Lt knee PTA, and has AE from previous hip surgery.  She reports spouse will assist her with her socks  Bed Mobility  Overal bed mobility Needs Assistance  Bed Mobility Sit to Sidelying;Rolling  Rolling Min assist  Sit to sidelying Mod assist;+2 for safety/equipment  General bed mobility comments cues for sequence with rail to prevent twisting, sit to sidely assist to bring legs to surface with cues for precautions  Transfers  Overall transfer level Needs assistance  Equipment used Rolling walker (2 wheeled)  Transfers  Sit to/from [facility]  Sit to Stand Min assist;+2 safety/equipment  Stand pivot transfers Min assist;+2 safety/equipment  General transfer comment min assist to stand from recliner with pt reporting dizziness in static standing with BP dropping to 60/38 . Pt returned to recliner and fully reclined to recover BP prior to pivot chair>BSC>bed with mobility limited by BP   Balance  Overall balance assessment Mild deficits observed, not formally tested  General Comments  General comments (skin integrity, edema, etc.) Pt with orthostatic hypotension with standing   OT - End of Session  Equipment Utilized During Treatment Gait belt;Back brace;Rolling walker  Activity Tolerance Treatment limited secondary to medical complications (Comment)  Patient left in bed;with call bell/phone within reach  Nurse Communication Mobility status;Other (comment) (orthostasis)  OT Assessment  OT Recommendation/Assessment Patient needs continued OT Services  OT Visit Diagnosis Unsteadiness on feet (R26.81);Pain  Pain - part of body  (back)  OT Problem List Decreased strength;Decreased activity tolerance;Decreased safety awareness;Decreased knowledge of use of DME or AE;Decreased knowledge of precautions;Pain  OT Plan  OT Frequency (ACUTE ONLY) Min 2X/week  OT Treatment/Interventions (ACUTE ONLY) Self-care/ADL training;DME and/or AE instruction;Therapeutic activities;Patient/family education  AM-PAC OT "6 Clicks" Daily Activity Outcome Measure (Version 2)  Help from another person eating meals? 4  Help from another person taking care of personal grooming? 3  Help from another person toileting, which includes using toliet, bedpan, or urinal? 2  Help from another person bathing (including washing, rinsing, drying)? 2  Help from another person to put on and taking off regular upper body clothing? 3  Help from another person to put on and taking off regular lower body clothing? 1  6 Click Score 15   OT Recommendation  Follow Up Recommendations Home health OT;Supervision/Assistance - 24 hour  OT Equipment None recommended by OT  Individuals Consulted  Consulted and Agree with Results and Recommendations Patient  Acute Rehab OT Goals  Patient Stated Goal to get better and get home  OT Goal Formulation With patient  Time For Goal Achievement 07/15/18  Potential to Achieve Goals Good  OT Time Calculation  OT Start Time (ACUTE ONLY) 1220  OT Stop Time (ACUTE ONLY) 1246  OT Time Calculation (min) 26 min  OT General Charges  $OT Visit 1 Visit  OT Evaluation  $OT Eval Moderate Complexity 1 Mod  Written Expression  Dominant Hand Right  Lucille Passy, OTR/L [place] Pager [phone] Office [phone]

## 2018-07-09 LAB — GLUCOSE, CAPILLARY
Glucose-Capillary: 139 mg/dL — ABNORMAL HIGH (ref 70–99)
Glucose-Capillary: 137 mg/dL — ABNORMAL HIGH (ref 70–99)

## 2018-07-09 MED ORDER — OXYCODONE HCL 5 MG PO TABS: 5.0000 mg | ORAL_TABLET | ORAL | 0 refills | Status: DC | PRN

## 2018-07-09 MED ORDER — FERROUS SULFATE 325 (65 FE) MG PO TABS: 325.0000 mg | ORAL_TABLET | Freq: Every day | ORAL | Status: DC

## 2018-07-09 MED ORDER — DOCUSATE SODIUM 100 MG PO CAPS: 100.0000 mg | ORAL_CAPSULE | Freq: Two times a day (BID) | ORAL | 0 refills | Status: DC

## 2018-07-09 MED ORDER — CYCLOBENZAPRINE HCL 10 MG PO TABS: 10.0000 mg | ORAL_TABLET | Freq: Three times a day (TID) | ORAL | 0 refills | Status: DC | PRN

## 2018-07-09 MED ORDER — FERROUS SULFATE 325 (65 FE) MG PO TABS: 325.0000 mg | ORAL_TABLET | Freq: Every day | ORAL | 0 refills | Status: DC

## 2018-07-09 MED ORDER — POLYETHYLENE GLYCOL 3350 17 G PO PACK
17.0000 g | PACK | Freq: Every day | ORAL | Status: DC | PRN
  Administered 2018-07-09: 17 g via ORAL
  Filled 2018-07-09: qty 1

## 2018-07-09 NOTE — Care Management Note (Signed)
Case Management Note  Patient Details  Name: Valerie Duran MRN: [id number] Date of Birth: 1946/05/23  Subjective/Objective:                    Action/Plan:  Spoke w patient. She states that she wants to use [facility] and provided name of [facility] for PT as she has had her in the past. Referral made to [facility]. [place] states she has a RW and has no other DME needs. She understands start of services will likely be mid week, No other CM needs.    Expected Discharge Date:  07/09/18               Expected Discharge Plan:  [place]  In-House Referral:     Discharge planning Services  CM Consult  Post Acute Care Choice:  Home Health Choice offered to:  Patient  DME Arranged:    DME Agency:     HH Arranged:  PT [place]:  [facility] ([facility] [employer])  Status of Service:  Completed, signed off  If discussed at [employer], dates discussed:    Additional Comments:  Carles Collet, RN 07/09/2018, 12:22 PM

## 2018-07-09 NOTE — Progress Notes (Signed)
Physical Therapy Treatment Patient Details Name: Valerie Duran MRN: [id number] DOB: 25-Aug-1945 Today's Date: 07/09/2018    History of Present Illness 73 yo admitted for T10-S1 PLIF. 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  Pt educated and participated in [place] application.  Pt required min assistance and moderate VCs to apply brace in seated position.  Pt remains slow and guarded with gait training but able to progress distance with no complaints of dizziness.  Plan next session for continued functional mobility and review of brace application.  Pt should d/c home and continue PT tx with HHPT.    Follow Up Recommendations  Home health PT;Supervision/Assistance - 24 hour     Equipment Recommendations  None recommended by PT    Recommendations for Other Services       Precautions / Restrictions Precautions Precautions: Fall;Back Precaution Booklet Issued: Yes (comment) Precaution Comments: watch BP Required Braces or Orthoses: Spinal Brace Spinal Brace: Applied in sitting position;Lumbar corset Restrictions Weight Bearing Restrictions: No Other Position/Activity Restrictions: Educated and allowed patient to practice with brace for carryover with application at home.      Mobility  Bed Mobility Overal bed mobility: Needs Assistance Bed Mobility: Rolling;Sidelying to Sit;Sit to Sidelying Rolling: Supervision Sidelying to sit: Min guard     Sit to sidelying: Mod assist General bed mobility comments: Pt required increased assistance to return to bed.    Transfers Overall transfer level: Needs assistance Equipment used: Rolling walker (2 wheeled) Transfers: Sit to/from Stand Sit to Stand: Supervision         General transfer comment: cues for hand placement, posture and safety  Ambulation/Gait Ambulation/Gait  assistance: Min guard Gait Distance (Feet): 200 Feet Assistive device: Rolling walker (2 wheeled) Gait Pattern/deviations: Step-through pattern;Decreased stride length     General Gait Details: good stability with use of Rw with cues for direction, pt with slight dizziness but BP tolerant of mobility and chair follow but not needed   Stairs             Wheelchair Mobility    Modified Rankin (Stroke Patients Only)       Balance Overall balance assessment: Mild deficits observed, not formally tested                                          Cognition Arousal/Alertness: Awake/alert Behavior During Therapy: WFL for tasks assessed/performed Overall Cognitive Status: Within Functional Limits for tasks assessed                                        Exercises      General Comments        Pertinent Vitals/Pain Pain Assessment: 0-10 Pain Score: 4  Pain Location: incision Pain Descriptors / Indicators: Aching;Sore Pain Intervention(s): Monitored during session;Repositioned    Home Living                      Prior Function            PT Goals (current goals can now be found in the care plan section) Acute Rehab PT Goals Patient  Stated Goal: to get better and get home Potential to Achieve Goals: Good Progress towards PT goals: Progressing toward goals    Frequency    Min 5X/week      PT Plan Current plan remains appropriate    Co-evaluation              AM-PAC PT "6 Clicks" Mobility   Outcome Measure  Help needed turning from your back to your side while in a flat bed without using bedrails?: A Little Help needed moving from lying on your back to sitting on the side of a flat bed without using bedrails?: A Little Help needed moving to and from a bed to a chair (including a wheelchair)?: A Little Help needed standing up from a chair using your arms (e.g., wheelchair or bedside chair)?: A Little Help  needed to walk in hospital room?: A Little Help needed climbing 3-5 steps with a railing? : A Little 6 Click Score: 18    End of Session Equipment Utilized During Treatment: Back brace Activity Tolerance: Patient tolerated treatment well Patient left: in chair;with call bell/phone within reach;with chair alarm set Nurse Communication: Mobility status;Precautions PT Visit Diagnosis: Other abnormalities of gait and mobility (R26.89)     Time: 1041-1110 PT Time Calculation (min) (ACUTE ONLY): 29 min  Charges:  $Gait Training: 8-22 mins $Therapeutic Activity: 8-22 mins                     Governor Rooks, PTA Acute Rehabilitation Services Pager [phone] Office [phone]     [place] Ammon Eli Hose 07/09/2018, 1:32 PM

## 2018-07-09 NOTE — Progress Notes (Addendum)
Occupational Therapy Treatment and Discharge Patient Details Name: Valerie Duran MRN: [id number] DOB: [birth year] Today's Date: 07/09/2018    History of present illness 73 yo admitted for T10-S1 PLIF. 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 Husband to A prn with ADLs or pt will use her AE from prior surgeries. No further acute OT needs, we will sign off and neurosurgery in room pt will be D/C'd today.   BP sitting 140/71 (85)  HR 101 BP standing 1 minute 126/68 (83)  HR 103 BP post session 130/65 (85) HR 106  Follow Up Recommendations  Home health OT;Supervision/Assistance - 24 hour    Equipment Recommendations  None recommended by OT       Precautions / Restrictions Precautions Precautions: Fall;Back Precaution Comments: watch BP Required Braces or Orthoses: Spinal Brace Spinal Brace: Applied in sitting position;Lumbar corset Restrictions Weight Bearing Restrictions: No       Mobility Bed Mobility               General bed mobility comments: Pt up in recliner upon arrival  Transfers Overall transfer level: Needs assistance Equipment used: Rolling walker (2 wheeled) Transfers: Sit to/from Stand Sit to Stand: Supervision              Balance Overall balance assessment: Mild deficits observed, not formally tested                                         ADL either performed or assessed with clinical judgement   ADL Overall ADL's : Needs assistance/impaired     Grooming: Wash/dry hands;Wash/dry face;Oral care;Supervision/safety;Standing Grooming Details (indicate cue type and reason): at sink Upper Body Bathing: Set up;Supervision/ safety;Standing Upper Body Bathing Details (indicate cue type and reason): at sink           Lower Body Dressing Details (indicate cue type and reason): reports  husband will A her or she will use her AE that she got when she had her kip and knee surgeries Toilet Transfer: Supervision/safety;Ambulation;RW Toilet Transfer Details (indicate cue type and reason): recliner>down hallway and back (160fet) to recliner           General ADL Comments: Needs A for donning TLSO due to hard to see upper clip and to get it around herself. Husband to A prn     Vision Patient Visual Report: No change from baseline            Cognition Arousal/Alertness: Awake/alert Behavior During Therapy: WFL for tasks assessed/performed Overall Cognitive Status: Within Functional Limits for tasks assessed                                                     Pertinent Vitals/ Pain       Pain Assessment: 0-10 Pain Score: 4  Pain Location: incision Pain Descriptors / Indicators: Aching;Sore Pain Intervention(s): Limited activity within patient's tolerance;Monitored during session;Repositioned         Frequency  Min 2X/week        Progress Toward Goals  OT Goals(current goals can now be found in the care  plan section)  Progress towards OT goals: Progressing toward goals     Plan Discharge plan remains appropriate       AM-PAC OT "6 Clicks" Daily Activity     Outcome Measure   Help from another person eating meals?: None Help from another person taking care of personal grooming?: A Little Help from another person toileting, which includes using toliet, bedpan, or urinal?: A Little Help from another person bathing (including washing, rinsing, drying)?: A Lot Help from another person to put on and taking off regular upper body clothing?: A Little Help from another person to put on and taking off regular lower body clothing?: Total(without AE) 6 Click Score: 16    End of Session Equipment Utilized During Treatment: Gait belt;Rolling walker;Back brace  OT Visit Diagnosis: Unsteadiness on feet (R26.81);Pain Pain - part of body:  (incisional)   Activity Tolerance Patient tolerated treatment well   Patient Left in chair;with call bell/phone within reach   Nurse Communication (No DME needs from OT)        Time: 0034-9611 OT Time Calculation (min): 39 min  Charges: OT General Charges $OT Visit: 1 Visit OT Treatments $Self Care/Home Management : 38-52 mins  [place], OTR/L Acute [employer] Pager [phone] Office [phone]      Almon Register 07/09/2018, 9:41 AM

## 2018-07-09 NOTE — Discharge Summary (Signed)
Physician Discharge Summary  Patient ID: Valerie Duran MRN: [id number] DOB/AGE: August 23, 1945 73 y.o.  Admit date: 07/06/2018 Discharge date: 07/09/2018  Admission Diagnoses: Lumbar degenerative disc disease, lumbar spinal stenosis, lumbar radiculopathy, neurogenic claudication, lumbago  Discharge Diagnoses: The same and acute postoperative blood loss anemia Active Problems:   Spinal stenosis   Spinal stenosis of lumbar region with neurogenic claudication   Discharged Condition: good  Hospital Course: I performed a T10 to the ilium decompression, instrumentation and fusion on the patient on 07/06/2018.  The patient's postoperative course was remarkable only for some orthostatic hypotension which has improved.  She had mild postoperative anemia.  I started her on iron PT and OT have seen the patient.  She has progressed well.  On postoperative day #3 the patient requested discharge to home.  She was given written and oral discharge instructions.  Arrangements were made for home physical therapy.  I have answered all her questions.  Consults: Physical therapy, Occupational Therapy Significant Diagnostic Studies: None Treatments: T10 to the ilium decompression, instrumentation and fusion Discharge Exam: Blood pressure 133/61, pulse (!) 103, temperature 98.6 F (37 C), temperature source Oral, resp. rate 17, SpO2 98 %. The patient is alert and pleasant.  She looks well.  Her dressing is clean and dry.  Her lower extremity strength is grossly normal.  Disposition: Home with physical therapy   Allergies as of 07/09/2018      Reactions   Ambien [zolpidem Tartrate] Other (See Comments)   Altered mental status   Barley Grass Other (See Comments)   Celiac disease   Wheat Bran Rash   Celiac disease   Tape Other (See Comments)   Adhesive tape - tears skin   Codeine Nausea Only   Ibuprofen Other (See Comments), Rash   GI upset " makes my stomach hurt"   Simvastatin Rash   Valium  [diazepam] Other (See Comments)   Sleep walks      Medication List    TAKE these medications   amoxicillin 500 MG tablet Commonly known as:  AMOXIL Take 2,000 mg by mouth See admin instructions. Take 2000 mg by mouth prior to dental appointments   Biotin 10000 MCG Tabs Take 20,000 mcg by mouth daily.   cyclobenzaprine 10 MG tablet Commonly known as:  FLEXERIL Take 1 tablet (10 mg total) by mouth 3 (three) times daily as needed for muscle spasms.   docusate sodium 100 MG capsule Commonly known as:  COLACE Take 1 capsule (100 mg total) by mouth 2 (two) times daily.   ferrous sulfate 325 (65 FE) MG tablet Take 1 tablet (325 mg total) by mouth daily with breakfast. Start taking on:  July 10, 2018   fexofenadine 180 MG tablet Commonly known as:  ALLEGRA Take 180 mg by mouth daily.   fluticasone 50 MCG/ACT nasal spray Commonly known as:  FLONASE Place 2 sprays into both nostrils daily.   Glucosamine-Chondroitin 750-600 MG Tabs Take 2 tablets by mouth daily.   levothyroxine 50 MCG tablet Commonly known as:  SYNTHROID, LEVOTHROID Take 50 mcg by mouth daily before breakfast.   Magnesium 500 MG Caps Take 500 mg by mouth daily.   metFORMIN 500 MG 24 hr tablet Commonly known as:  GLUCOPHAGE-XR Take 500 mg by mouth daily with breakfast.   oxyCODONE 5 MG immediate release tablet Commonly known as:  Oxy IR/ROXICODONE Take 1 tablet (5 mg total) by mouth every 4 (four) hours as needed for moderate pain ((score 4 to 6)).   pramipexole  1 MG tablet Commonly known as:  MIRAPEX Take 1 mg by mouth 2 (two) times daily.        Signed: Ophelia Charter 07/09/2018, 9:39 AM

## 2018-07-09 NOTE — Progress Notes (Signed)
NURSING PROGRESS NOTE  Flossie Wexler [id number] Discharge Data: 07/09/2018 2:12 PM Attending Provider: Newman Pies, MD [id number], Beverely Low, MD     Leonia Reeves to be D/C'd Home per MD order.  Discussed with the patient the After Visit Summary and all questions fully answered. All IV's discontinued with no bleeding noted. All belongings returned to patient for patient to take home.   Last Vital Signs:  Blood pressure (!) 126/50, pulse 92, temperature 98.1 F (36.7 C), temperature source Oral, resp. rate 17, SpO2 96 %.  Discharge Medication List Allergies as of 07/09/2018      Reactions   Ambien [zolpidem Tartrate] Other (See Comments)   Altered mental status   Barley Grass Other (See Comments)   Celiac disease   Wheat Bran Rash   Celiac disease   Tape Other (See Comments)   Adhesive tape - tears skin   Codeine Nausea Only   Ibuprofen Other (See Comments), Rash   GI upset " makes my stomach hurt"   Simvastatin Rash   Valium [diazepam] Other (See Comments)   Sleep walks      Medication List    TAKE these medications   amoxicillin 500 MG tablet Commonly known as:  AMOXIL Take 2,000 mg by mouth See admin instructions. Take 2000 mg by mouth prior to dental appointments   Biotin 10000 MCG Tabs Take 20,000 mcg by mouth daily.   cyclobenzaprine 10 MG tablet Commonly known as:  FLEXERIL Take 1 tablet (10 mg total) by mouth 3 (three) times daily as needed for muscle spasms.   docusate sodium 100 MG capsule Commonly known as:  COLACE Take 1 capsule (100 mg total) by mouth 2 (two) times daily.   ferrous sulfate 325 (65 FE) MG tablet Take 1 tablet (325 mg total) by mouth daily with breakfast. Start taking on:  July 10, 2018   fexofenadine 180 MG tablet Commonly known as:  ALLEGRA Take 180 mg by mouth daily.   fluticasone 50 MCG/ACT nasal spray Commonly known as:  FLONASE Place 2 sprays into both nostrils daily.   Glucosamine-Chondroitin 750-600 MG  Tabs Take 2 tablets by mouth daily.   levothyroxine 50 MCG tablet Commonly known as:  SYNTHROID, LEVOTHROID Take 50 mcg by mouth daily before breakfast.   Magnesium 500 MG Caps Take 500 mg by mouth daily.   metFORMIN 500 MG 24 hr tablet Commonly known as:  GLUCOPHAGE-XR Take 500 mg by mouth daily with breakfast.   oxyCODONE 5 MG immediate release tablet Commonly known as:  Oxy IR/ROXICODONE Take 1 tablet (5 mg total) by mouth every 4 (four) hours as needed for moderate pain ((score 4 to 6)).   pramipexole 1 MG tablet Commonly known as:  MIRAPEX Take 1 mg by mouth 2 (two) times daily.

## 2018-07-10 ENCOUNTER — Encounter (HOSPITAL_COMMUNITY): Payer: Self-pay | Admitting: Neurosurgery

## 2018-07-12 MED FILL — Sodium Chloride IV Soln 0.9%: INTRAVENOUS | Qty: 2000 | Status: AC

## 2018-07-12 MED FILL — Heparin Sodium (Porcine) Inj 1000 Unit/ML: INTRAMUSCULAR | Qty: 30 | Status: AC

## 2018-07-19 ENCOUNTER — Other Ambulatory Visit: Payer: Self-pay

## 2018-07-19 ENCOUNTER — Encounter: Payer: Self-pay | Admitting: Emergency Medicine

## 2018-07-19 ENCOUNTER — Observation Stay
Admission: EM | Admit: 2018-07-19 | Discharge: 2018-07-20 | Disposition: A | Discharge status: Home / Self Care | Payer: Medicare Other | Attending: Internal Medicine | Admitting: Internal Medicine

## 2018-07-19 ENCOUNTER — Other Ambulatory Visit
Admission: RE | Admit: 2018-07-19 | Discharge: 2018-07-19 | Disposition: A | Discharge status: Home / Self Care | Payer: Medicare Other | Source: Ambulatory Visit | Attending: Family Medicine | Admitting: Family Medicine

## 2018-07-19 DIAGNOSIS — R609 Edema, unspecified: Secondary | ICD-10-CM

## 2018-07-19 DIAGNOSIS — R600 Localized edema: Secondary | ICD-10-CM | POA: Insufficient documentation

## 2018-07-19 DIAGNOSIS — Z79891 Long term (current) use of opiate analgesic: Secondary | ICD-10-CM | POA: Insufficient documentation

## 2018-07-19 DIAGNOSIS — Z981 Arthrodesis status: Secondary | ICD-10-CM | POA: Diagnosis not present

## 2018-07-19 DIAGNOSIS — E119 Type 2 diabetes mellitus without complications: Secondary | ICD-10-CM | POA: Diagnosis not present

## 2018-07-19 DIAGNOSIS — G2581 Restless legs syndrome: Secondary | ICD-10-CM | POA: Diagnosis not present

## 2018-07-19 DIAGNOSIS — N63 Unspecified lump in unspecified breast: Secondary | ICD-10-CM

## 2018-07-19 DIAGNOSIS — N6310 Unspecified lump in the right breast, unspecified quadrant: Secondary | ICD-10-CM | POA: Insufficient documentation

## 2018-07-19 DIAGNOSIS — Z79890 Hormone replacement therapy (postmenopausal): Secondary | ICD-10-CM | POA: Diagnosis not present

## 2018-07-19 DIAGNOSIS — Z79899 Other long term (current) drug therapy: Secondary | ICD-10-CM | POA: Insufficient documentation

## 2018-07-19 DIAGNOSIS — D649 Anemia, unspecified: Principal | ICD-10-CM | POA: Diagnosis present

## 2018-07-19 DIAGNOSIS — M7989 Other specified soft tissue disorders: Secondary | ICD-10-CM | POA: Insufficient documentation

## 2018-07-19 LAB — BASIC METABOLIC PANEL
Anion gap: 6 (ref 5–15)
BUN: 9 mg/dL (ref 8–23)
CO2: 27 mmol/L (ref 22–32)
Calcium: 8.4 mg/dL — ABNORMAL LOW (ref 8.9–10.3)
Chloride: 98 mmol/L (ref 98–111)
Creatinine, Ser: 0.53 mg/dL (ref 0.44–1.00)
GFR calc non Af Amer: >60 mL/min (ref >60)
Glucose, Bld: 140 mg/dL — ABNORMAL HIGH (ref 70–99)
Potassium: 4.4 mmol/L (ref 3.5–5.1)
Sodium: 131 mmol/L — ABNORMAL LOW (ref 135–145)

## 2018-07-19 LAB — ABO/RH: ABO/RH(D): A NEG

## 2018-07-19 LAB — CBC
HEMATOCRIT: 25 % — AB (ref 36.0–46.0)
Hemoglobin: 7.7 g/dL — ABNORMAL LOW (ref 12.0–15.0)
MCH: 28.2 pg (ref 26.0–34.0)
MCHC: 30.8 g/dL (ref 30.0–36.0)
MCV: 91.6 fL (ref 80.0–100.0)
Platelets: 623 10*3/uL — ABNORMAL HIGH (ref 150–400)
RBC: 2.73 MIL/uL — ABNORMAL LOW (ref 3.87–5.11)
RDW: 13.7 % (ref 11.5–15.5)
WBC: 8.4 10*3/uL (ref 4.0–10.5)
nRBC: 0 % (ref 0.0–0.2)

## 2018-07-19 LAB — GLUCOSE, CAPILLARY: Glucose-Capillary: 135 mg/dL — ABNORMAL HIGH (ref 70–99)

## 2018-07-19 LAB — BRAIN NATRIURETIC PEPTIDE: B NATRIURETIC PEPTIDE 5: 127 pg/mL — AB (ref 0.0–100.0)

## 2018-07-19 LAB — PREPARE RBC (CROSSMATCH)

## 2018-07-19 MED ORDER — INSULIN ASPART 100 UNIT/ML ~~LOC~~ SOLN: 0.0000 [IU] | Freq: Three times a day (TID) | SUBCUTANEOUS | Status: DC

## 2018-07-19 MED ORDER — PRAMIPEXOLE DIHYDROCHLORIDE 1 MG PO TABS
1.0000 mg | ORAL_TABLET | Freq: Two times a day (BID) | ORAL | Status: DC
  Administered 2018-07-19 – 2018-07-20 (×2): 1 mg via ORAL
  Filled 2018-07-19 (×2): qty 1

## 2018-07-19 MED ORDER — INFLUENZA VAC SPLIT HIGH-DOSE 0.5 ML IM SUSY
0.5000 mL | PREFILLED_SYRINGE | INTRAMUSCULAR | Status: DC
  Filled 2018-07-19: qty 0.5

## 2018-07-19 MED ORDER — SODIUM CHLORIDE 0.9 % IV SOLN
10.0000 mL/h | Freq: Once | INTRAVENOUS | Status: AC
  Administered 2018-07-19: 10 mL/h via INTRAVENOUS

## 2018-07-19 MED ORDER — ACETAMINOPHEN 325 MG PO TABS: 650.0000 mg | ORAL_TABLET | Freq: Four times a day (QID) | ORAL | Status: DC | PRN

## 2018-07-19 MED ORDER — HYDROCODONE-ACETAMINOPHEN 5-325 MG PO TABS
1.0000 | ORAL_TABLET | Freq: Once | ORAL | Status: AC
  Administered 2018-07-19: 1 via ORAL
  Filled 2018-07-19: qty 1

## 2018-07-19 MED ORDER — DOCUSATE SODIUM 100 MG PO CAPS
100.0000 mg | ORAL_CAPSULE | Freq: Two times a day (BID) | ORAL | Status: DC
  Administered 2018-07-19 – 2018-07-20 (×2): 100 mg via ORAL
  Filled 2018-07-19 (×2): qty 1

## 2018-07-19 MED ORDER — ONDANSETRON HCL 4 MG PO TABS: 4.0000 mg | ORAL_TABLET | Freq: Four times a day (QID) | ORAL | Status: DC | PRN

## 2018-07-19 MED ORDER — CYCLOBENZAPRINE HCL 10 MG PO TABS: 10.0000 mg | ORAL_TABLET | Freq: Three times a day (TID) | ORAL | Status: DC | PRN

## 2018-07-19 MED ORDER — MAGNESIUM OXIDE 400 (241.3 MG) MG PO TABS
400.0000 mg | ORAL_TABLET | Freq: Every day | ORAL | Status: DC
  Administered 2018-07-19 – 2018-07-20 (×2): 400 mg via ORAL
  Filled 2018-07-19 (×2): qty 1

## 2018-07-19 MED ORDER — METFORMIN HCL ER 500 MG PO TB24
500.0000 mg | ORAL_TABLET | Freq: Every day | ORAL | Status: DC
  Administered 2018-07-20: 500 mg via ORAL
  Filled 2018-07-19: qty 1

## 2018-07-19 MED ORDER — FLUTICASONE PROPIONATE 50 MCG/ACT NA SUSP
2.0000 | Freq: Every day | NASAL | Status: DC
  Administered 2018-07-19 – 2018-07-20 (×2): 2 via NASAL
  Filled 2018-07-19: qty 16

## 2018-07-19 MED ORDER — ONDANSETRON HCL 4 MG/2ML IJ SOLN: 4.0000 mg | Freq: Four times a day (QID) | INTRAMUSCULAR | Status: DC | PRN

## 2018-07-19 MED ORDER — OXYCODONE HCL 5 MG PO TABS
5.0000 mg | ORAL_TABLET | ORAL | Status: DC | PRN
  Administered 2018-07-19 – 2018-07-20 (×3): 5 mg via ORAL
  Filled 2018-07-19 (×3): qty 1

## 2018-07-19 MED ORDER — LORATADINE 10 MG PO TABS
10.0000 mg | ORAL_TABLET | Freq: Every day | ORAL | Status: DC
  Administered 2018-07-19 – 2018-07-20 (×2): 10 mg via ORAL
  Filled 2018-07-19 (×3): qty 1

## 2018-07-19 MED ORDER — LEVOTHYROXINE SODIUM 50 MCG PO TABS
50.0000 ug | ORAL_TABLET | Freq: Every day | ORAL | Status: DC
  Administered 2018-07-20: 50 ug via ORAL
  Filled 2018-07-19: qty 1

## 2018-07-19 MED ORDER — ACETAMINOPHEN 650 MG RE SUPP: 650.0000 mg | Freq: Four times a day (QID) | RECTAL | Status: DC | PRN

## 2018-07-19 MED ORDER — GLUCOSAMINE-CHONDROITIN 750-600 MG PO TABS: 2.0000 | ORAL_TABLET | Freq: Every day | ORAL | Status: DC

## 2018-07-19 MED ORDER — FERROUS SULFATE 325 (65 FE) MG PO TABS: 325.0000 mg | ORAL_TABLET | Freq: Every day | ORAL | Status: DC

## 2018-07-19 MED ORDER — BIOTIN 10000 MCG PO TABS: 20000.0000 ug | ORAL_TABLET | Freq: Every day | ORAL | Status: DC

## 2018-07-19 NOTE — Progress Notes (Signed)
Family Meeting Note  Advance Directive: yes patient came in with low hemoglobin weakness and bilateral lower extremity edema. She had recent spinal fusion surgery in mid-January. She has chronic anemia requiring blood transfusions in the past mainly after three. She has diabetes and hypertension. Code status discussed patient wants to be full code Time spent during discussion: 16 minutes Fritzi Mandes, MD

## 2018-07-19 NOTE — ED Triage Notes (Signed)
Patient presents to ED via POV from home due to low hemoglobin. Patient reports she was called by her PCP and told to come to ED for a blood transfusion. They report her hemoglobin was 7. Patient also reports edema to bilateral lower extremities. Patient arrives in TLSO brace. Patient reports recent back surgery 2 weeks ago.

## 2018-07-19 NOTE — H&P (Signed)
[facility] NAME: Delight Bickle    MR#:  [id number]  DATE OF BIRTH:  [birth year]  DATE OF ADMISSION:  07/19/2018  PRIMARY CARE PHYSICIAN: Derinda Late, MD   REQUESTING/REFERRING PHYSICIAN: dr Quentin Cornwall  CHIEF COMPLAINT:  my blood counts are low back is hurting HISTORY OF PRESENT ILLNESS:  Kenlea Woodell  is a 73 y.o. female with a known history of degenerative spine status post recent fusion of spine/extensive surgery done in mid January. Patient has been getting physical therapy at home. She started noticing swelling in both lower extremities. She had blood work done which showed hemoglobin of 7.7. Patient came to the emergency room for further evaluation of management.  She has had similar presentation in the past when she had hip replacement surgery when she became anemic postop and had swelling of both lower extremities. Patient got blood transfusion at that time as well.  Last hemoglobin was 9.3 mid-January when she was discharged to home.  He has had EGD and colonoscopy in August 2019  It is being admitted for blood transfusion. Denies any chest pain or shortness of breath.  PAST MEDICAL HISTORY:   Past Medical History:  Diagnosis Date  . Anemia   . Arthritis   . Back pain   . Celiac disease   . Depression   . Diabetes mellitus without complication ([place])   . GERD (gastroesophageal reflux disease)   . Heart murmur   . MVP (mitral valve prolapse)   . RLS (restless legs syndrome)     PAST SURGICAL HISTOIRY:   Past Surgical History:  Procedure Laterality Date  . ABDOMINAL HYSTERECTOMY    . APPLICATION OF INTRAOPERATIVE CT SCAN N/A 07/06/2018   Procedure: APPLICATION OF INTRAOPERATIVE CT SCAN;  Surgeon: Newman Pies, MD;  Location: [place];  Service: Neurosurgery;  Laterality: N/A;  . BACK SURGERY     spinal fusion  . CATARACT EXTRACTION W/ INTRAOCULAR LENS  IMPLANT, BILATERAL Bilateral   . [place]  . COLONOSCOPY WITH PROPOFOL N/A 02/04/2018   Procedure: COLONOSCOPY WITH PROPOFOL;  Surgeon: Manya Silvas, MD;  Location: [facility] ENDOSCOPY;  Service: Endoscopy;  Laterality: N/A;  . ESOPHAGOGASTRODUODENOSCOPY (EGD) WITH PROPOFOL N/A 02/04/2018   Procedure: ESOPHAGOGASTRODUODENOSCOPY (EGD) WITH PROPOFOL;  Surgeon: Manya Silvas, MD;  Location: [facility] ENDOSCOPY;  Service: Endoscopy;  Laterality: N/A;  . FOOT SURGERY Right 2007  . HARDWARE REMOVAL Right 01/14/2015   Procedure: HARDWARE REMOVAL/ REMOVAL OF 3 CANNULATED SCREWS FROM RIGHT HIP.;  Surgeon: Dereck Leep, MD;  Location: [facility];  Service: Orthopedics;  Laterality: Right;  . HIP FRACTURE SURGERY Right 2016  . JOINT REPLACEMENT Bilateral 2014   knees  . NECK SURGERY     fusion  . TOTAL HIP ARTHROPLASTY Right 01/14/2015   Procedure: TOTAL HIP ARTHROPLASTY;  Surgeon: Dereck Leep, MD;  Location: [facility] OR[facility];  Service: Orthopedics;  Laterality: Right;    SOCIAL HISTORY:   Social History   Tobacco Use  . Smoking status: Never Smoker  . Smokeless tobacco: Never Used  Substance Use Topics  . Alcohol use: No    FAMILY HISTORY:   Family History  Problem Relation Age of Onset  . Cancer Father 34       Colon Cancer  . Breast cancer Father 74  . Breast cancer Sister   . Stroke Mother   . Heart disease Maternal Grandmother     DRUG ALLERGIES:  Allergies  Allergen Reactions  . Ambien [Zolpidem Tartrate] Other (See Comments)    Altered mental status  . Barley Grass Other (See Comments)    Celiac disease  . Wheat Bran Rash    Celiac disease  . Tape Other (See Comments)    Adhesive tape - tears skin  . Codeine Nausea Only  . Ibuprofen Other (See Comments) and Rash    GI upset " makes my stomach hurt"  . Simvastatin Rash  . Valium [Diazepam] Other (See Comments)    Sleep walks    REVIEW OF SYSTEMS:  Review of Systems  Constitutional: Positive for malaise/fatigue. Negative for chills, fever and  weight loss.  HENT: Negative for ear discharge, ear pain and nosebleeds.   Eyes: Negative for blurred vision, pain and discharge.  Respiratory: Negative for sputum production, shortness of breath, wheezing and stridor.   Cardiovascular: Positive for leg swelling. Negative for chest pain, palpitations, orthopnea and PND.  Gastrointestinal: Negative for abdominal pain, diarrhea, nausea and vomiting.  Genitourinary: Negative for frequency and urgency.  Musculoskeletal: Positive for back pain and joint pain.  Neurological: Positive for weakness. Negative for sensory change, speech change and focal weakness.  Psychiatric/Behavioral: Negative for depression and hallucinations. The patient is not nervous/anxious.      MEDICATIONS AT HOME:   Prior to Admission medications   Medication Sig Start Date End Date Taking? Authorizing Provider  amoxicillin (AMOXIL) 500 MG tablet Take 2,000 mg by mouth See admin instructions. Take 2000 mg by mouth prior to dental appointments    [provider]  Biotin 10000 MCG TABS Take 20,000 mcg by mouth daily.     [provider]  cyclobenzaprine (FLEXERIL) 10 MG tablet Take 1 tablet (10 mg total) by mouth 3 (three) times daily as needed for muscle spasms. 07/09/18   Newman Pies, MD  docusate sodium (COLACE) 100 MG capsule Take 1 capsule (100 mg total) by mouth 2 (two) times daily. 07/09/18   Newman Pies, MD  ferrous sulfate 325 (65 FE) MG tablet Take 1 tablet (325 mg total) by mouth daily with breakfast. 07/10/18   Newman Pies, MD  fexofenadine (ALLEGRA) 180 MG tablet Take 180 mg by mouth daily.    [provider]  fluticasone (FLONASE) 50 MCG/ACT nasal spray Place 2 sprays into both nostrils daily.  12/13/17   [provider]  Glucosamine-Chondroitin 750-600 MG TABS Take 2 tablets by mouth daily.     [provider]  levothyroxine (SYNTHROID, LEVOTHROID) 50 MCG tablet Take 50 mcg by mouth daily before breakfast.   09/09/17 09/09/18  [provider]  Magnesium 500 MG CAPS Take 500 mg by mouth daily.     [provider]  metFORMIN (GLUCOPHAGE-XR) 500 MG 24 hr tablet Take 500 mg by mouth daily with breakfast.  09/09/17 09/09/18  [provider]  oxyCODONE (OXY IR/ROXICODONE) 5 MG immediate release tablet Take 1 tablet (5 mg total) by mouth every 4 (four) hours as needed for moderate pain ((score 4 to 6)). 07/09/18   Newman Pies, MD  pramipexole (MIRAPEX) 1 MG tablet Take 1 mg by mouth 2 (two) times daily.  06/28/14   [provider]      VITAL SIGNS:  Blood pressure (!) 132/52, pulse (!) 103, temperature 98.2 F (36.8 C), temperature source Oral, resp. rate 17, height 5' 8"  (1.727 m), weight 89.8 kg, SpO2 100 %.  PHYSICAL EXAMINATION:  GENERAL:  73 y.o.-year-old patient lying in the bed with no acute distress.  EYES: Pupils equal, round, reactive to light and accommodation. No scleral icterus. Extraocular muscles intact.  HEENT: Head atraumatic, normocephalic. Oropharynx and nasopharynx clear.  NECK:  Supple, no jugular venous distention. No thyroid enlargement, no tenderness.  LUNGS: Normal breath sounds bilaterally, no wheezing, rales,rhonchi or crepitation. No use of accessory muscles of respiration.  CARDIOVASCULAR: S1, S2 normal. No murmurs, rubs, or gallops.  ABDOMEN: Soft, nontender, nondistended. Bowel sounds present. No organomegaly or mass.  EXTREMITIES: +++ pedal edema,no cyanosis, or clubbing.  NEUROLOGIC: Cranial nerves II through XII are intact. Muscle strength 5/5 in all extremities. Sensation intact. Gait not checked.  PSYCHIATRIC: The patient is alert and oriented x 3.  SKIN: No obvious rash, lesion, or ulcer.   LABORATORY PANEL:   CBC Recent Labs  Lab 07/19/18 1341  WBC 8.4  HGB 7.7*  HCT 25.0*  PLT 623*   ------------------------------------------------------------------------------------------------------------------  Chemistries  Recent  Labs  Lab 07/19/18 1341  NA 131*  K 4.4  CL 98  CO2 27  GLUCOSE 140*  BUN 9  CREATININE 0.53  CALCIUM 8.4*   ------------------------------------------------------------------------------------------------------------------  Cardiac Enzymes No results for input(s): TROPONINI in the last 168 hours. ------------------------------------------------------------------------------------------------------------------  RADIOLOGY:  No results found.  EKG:    IMPRESSION AND PLAN:   Khadejah Son  is a 73 y.o. female with a known history of degenerative spine status post recent fusion of spine/extensive surgery done in mid January. Patient has been getting physical therapy at home. She started noticing swelling in both lower extremities. She had blood work done which showed hemoglobin of 7.7. Patient came to the emergency room for further evaluation of management.  1. Symptomatic anemia with leg swelling suspected due to recent spinal surgery -patient has chronic anemia her last hemoglobin was 9.3 -came in with hemoglobin of 7.7 -patient will get transfuse one unit. She is aware of the risk and complications of blood transfusion -continue iron pills -patient had G.I. workup with EGD in August 2019 that showed gastritis and colonoscopy showed some polyps and diverticulosis.  2. Bilateral lower extremity edema -patient reports she had similar symptoms surgery went anemic and had bilateral leg edema which resolved after blood transfusion and iron pills -given recent surgery I will get ultrasound Doppler the lower extremity rule out DVT -patient advised Ted hose if possible to wear to help with leg swelling  3. Diabetes continue home meds  4. Recent spinal surgery -continue physical therapy and occupational therapy at home  5. DVT prophylaxis SCD's   All the records are reviewed and case discussed with ED provider.   CODE STATUS: full  TOTAL TIME TAKING CARE OF THIS PATIENT: *50*  minutes.    Fritzi Mandes M.D on 07/19/2018 at 4:48 PM  Between 7am to 6pm - Pager - [phone]  After 6pm go to [email or web address] - password [email or web address] [facility]  [facility]  Office  [phone]  CC: Primary care physician; Derinda Late, MD

## 2018-07-19 NOTE — ED Notes (Signed)
This RN called ultrasound to notified pt has an assigned bed. [place] tech will get pt from assigned room.

## 2018-07-19 NOTE — ED Provider Notes (Signed)
[facility] Emergency Department Provider Note    First MD Initiated Contact with Patient 07/19/18 1631     (approximate)  I have reviewed the triage vital signs and the nursing notes.   HISTORY  Chief Complaint Abnormal Lab    HPI Valerie Duran is a 73 y.o. female below listed past medical history presents the ER for low hemoglobin as well as fatigue shortness of breath and weakness over the past several days.  Patient is recently status post spinal surgery.  Does have a history of iron deficiency anemia but has not been taking iron supplementation.  States that she has had be admitted to the hospital previously for iron and blood transfusions.  States that she is also been having worsening lower extremity swelling for the past several days.    Past Medical History:  Diagnosis Date  . Anemia   . Arthritis   . Back pain   . Celiac disease   . Depression   . Diabetes mellitus without complication ([place])   . GERD (gastroesophageal reflux disease)   . Heart murmur   . MVP (mitral valve prolapse)   . RLS (restless legs syndrome)    Family History  Problem Relation Age of Onset  . Cancer Father 36       Colon Cancer  . Breast cancer Father 3  . Breast cancer Sister   . Stroke Mother   . Heart disease Maternal Grandmother    Past Surgical History:  Procedure Laterality Date  . ABDOMINAL HYSTERECTOMY    . APPLICATION OF INTRAOPERATIVE CT SCAN N/A 07/06/2018   Procedure: APPLICATION OF INTRAOPERATIVE CT SCAN;  Surgeon: Newman Pies, MD;  Location: [place];  Service: Neurosurgery;  Laterality: N/A;  . BACK SURGERY     spinal fusion  . CATARACT EXTRACTION W/ INTRAOCULAR LENS  IMPLANT, BILATERAL Bilateral   . [place]  . COLONOSCOPY WITH PROPOFOL N/A 02/04/2018   Procedure: COLONOSCOPY WITH PROPOFOL;  Surgeon: Manya Silvas, MD;  Location[facility] ENDOSCOPY;  Service: Endoscopy;  Laterality: N/A;  .  ESOPHAGOGASTRODUODENOSCOPY (EGD) WITH PROPOFOL N/A 02/04/2018   Procedure: ESOPHAGOGASTRODUODENOSCOPY (EGD) WITH PROPOFOL;  Surgeon: Manya Silvas, MD;  Location: [facility] ENDOSCOPY;  Service: Endoscopy;  Laterality: N/A;  . FOOT SURGERY Right 2007  . HARDWARE REMOVAL Right 01/14/2015   Procedure: HARDWARE REMOVAL/ REMOVAL OF 3 CANNULATED SCREWS FROM RIGHT HIP.;  Surgeon: Dereck Leep, MD;  Location: [facility] ORS;  Service: Orthopedics;  Laterality: Right;  . HIP FRACTURE SURGERY Right 2016  . JOINT REPLACEMENT Bilateral 2014   knees  . NECK SURGERY     fusion  . TOTAL HIP ARTHROPLASTY Right 01/14/2015   Procedure: TOTAL HIP ARTHROPLASTY;  Surgeon: Dereck Leep, MD;  Location: [facility] ORS;  Service: Orthopedics;  Laterality: Right;   Patient Active Problem List   Diagnosis Date Noted  . Spinal stenosis 07/06/2018  . Spinal stenosis of lumbar region with neurogenic claudication 07/06/2018  . Chest pain with moderate risk for cardiac etiology 03/01/2018  . Type 2 diabetes mellitus with complication, with long-term current use of insulin ([place]) 03/01/2018  . S/P total hip arthroplasty 01/14/2015  . Iron deficiency anemia 12/20/2014  . CD (celiac disease) 12/13/2014  . Chronic LBP 12/13/2014  . Clinical depression 12/13/2014  . Fatigue 12/13/2014  . Acid reflux 12/13/2014  . Restless leg 12/13/2014  . Anemia 12/13/2014  . Closed basicervical fracture of neck of femur ([place]) 08/24/2014  . Closed  fracture of neck of radius 08/24/2014  . Fracture of neck of radius 08/07/2014      Prior to Admission medications   Medication Sig Start Date End Date Taking? Authorizing Provider  amoxicillin (AMOXIL) 500 MG tablet Take 2,000 mg by mouth See admin instructions. Take 2000 mg by mouth prior to dental appointments    [provider]  Biotin 10000 MCG TABS Take 20,000 mcg by mouth daily.     [provider]  cyclobenzaprine (FLEXERIL) 10 MG tablet Take 1 tablet (10 mg total) by mouth  3 (three) times daily as needed for muscle spasms. 07/09/18   Newman Pies, MD  docusate sodium (COLACE) 100 MG capsule Take 1 capsule (100 mg total) by mouth 2 (two) times daily. 07/09/18   Newman Pies, MD  ferrous sulfate 325 (65 FE) MG tablet Take 1 tablet (325 mg total) by mouth daily with breakfast. 07/10/18   Newman Pies, MD  fexofenadine (ALLEGRA) 180 MG tablet Take 180 mg by mouth daily.    [provider]  fluticasone (FLONASE) 50 MCG/ACT nasal spray Place 2 sprays into both nostrils daily.  12/13/17   [provider]  Glucosamine-Chondroitin 750-600 MG TABS Take 2 tablets by mouth daily.     [provider]  levothyroxine (SYNTHROID, LEVOTHROID) 50 MCG tablet Take 50 mcg by mouth daily before breakfast.  09/09/17 09/09/18  [provider]  Magnesium 500 MG CAPS Take 500 mg by mouth daily.     [provider]  metFORMIN (GLUCOPHAGE-XR) 500 MG 24 hr tablet Take 500 mg by mouth daily with breakfast.  09/09/17 09/09/18  [provider]  oxyCODONE (OXY IR/ROXICODONE) 5 MG immediate release tablet Take 1 tablet (5 mg total) by mouth every 4 (four) hours as needed for moderate pain ((score 4 to 6)). 07/09/18   Newman Pies, MD  pramipexole (MIRAPEX) 1 MG tablet Take 1 mg by mouth 2 (two) times daily.  06/28/14   [provider]    Allergies Ambien [zolpidem tartrate]; Barley grass; Wheat bran; Tape; Codeine; Ibuprofen; Simvastatin; and Valium [diazepam]    Social History Social History   Tobacco Use  . Smoking status: Never Smoker  . Smokeless tobacco: Never Used  Substance Use Topics  . Alcohol use: No  . Drug use: No    Review of Systems Patient denies headaches, rhinorrhea, blurry vision, numbness, shortness of breath, chest pain, edema, cough, abdominal pain, nausea, vomiting, diarrhea, dysuria, fevers, rashes or hallucinations unless otherwise stated above in  HPI. ____________________________________________   PHYSICAL EXAM:  VITAL SIGNS: Vitals:   07/19/18 1336 07/19/18 1552  BP: (!) 132/52   Pulse: (!) 103   Resp: 17   Temp: 98.2 F (36.8 C)   SpO2: 100% 100%    Constitutional: Alert and oriented.  Eyes: Conjunctivae are pale Head: Atraumatic. Nose: No congestion/rhinnorhea. Mouth/Throat: Mucous membranes are moist.   Neck: No stridor. Painless ROM.  Cardiovascular: Normal rate, regular rhythm. Grossly normal heart sounds.  Good peripheral circulation. Respiratory: Normal respiratory effort.  No retractions. Lungs CTAB. Gastrointestinal: Soft and nontender. No distention. No abdominal bruits. No CVA tenderness. Genitourinary: deferred Musculoskeletal: No lower extremity tenderness nor edema.  No joint effusions. Neurologic:  Normal speech and language. No gross focal neurologic deficits are appreciated. No facial droop Skin:  Skin is warm, dry and intact. No rash noted. Psychiatric: Mood and affect are normal. Speech and behavior are normal.  ____________________________________________   LABS (all labs ordered are listed, but only abnormal results  are displayed)  Results for orders placed or performed during the hospital encounter of 07/19/18 (from the past 24 hour(s))  CBC     Status: Abnormal   Collection Time: 07/19/18  1:41 PM  Result Value Ref Range   WBC 8.4 4.0 - 10.5 K/uL   RBC 2.73 (L) 3.87 - 5.11 MIL/uL   Hemoglobin 7.7 (L) 12.0 - 15.0 g/dL   HCT 25.0 (L) 36.0 - 46.0 %   MCV 91.6 80.0 - 100.0 fL   MCH 28.2 26.0 - 34.0 pg   MCHC 30.8 30.0 - 36.0 g/dL   RDW 13.7 11.5 - 15.5 %   Platelets 623 (H) 150 - 400 K/uL   nRBC 0.0 0.0 - 0.2 %  Basic metabolic panel     Status: Abnormal   Collection Time: 07/19/18  1:41 PM  Result Value Ref Range   Sodium 131 (L) 135 - 145 mmol/L   Potassium 4.4 3.5 - 5.1 mmol/L   Chloride 98 98 - 111 mmol/L   CO2 27 22 - 32 mmol/L   Glucose, Bld 140 (H) 70 - 99 mg/dL   BUN 9 8 -  23 mg/dL   Creatinine, Ser 0.53 0.44 - 1.00 mg/dL   Calcium 8.4 (L) 8.9 - 10.3 mg/dL   GFR calc non Af Amer >60 >60 mL/min   GFR calc Af Amer >60 >60 mL/min   Anion gap 6 5 - 15  ABO/Rh     Status: None (Preliminary result)   Collection Time: 07/19/18  1:41 PM  Result Value Ref Range   ABO/RH(D) PENDING    ____________________________________________ ____________________________________________   PROCEDURES  Procedure(s) performed:  Procedures    Critical Care performed: no ____________________________________________   INITIAL IMPRESSION / ASSESSMENT AND PLAN / ED COURSE  Pertinent labs & imaging results that were available during my care of the patient were reviewed by me and considered in my medical decision making (see chart for details).   DDX: Symptomatic anemia, IDA, anemia of chronic disease, postop blood loss, acute blood loss anemia  Anetria Harwick is a 73 y.o. who presents to the ED with symptoms as described above.  She is afebrile and hemodynamically stable.  She is pale appearing does have evidence of symptomatic anemia.  No evidence of GI bleeding and has been extensively worked up for this in the past.  Seems more likely secondary to recent surgery and postop blood losses.  Based on her symptoms will order transfusion and discussed case with hospitalist for admission the hospital for transfusion and hemodynamic monitoring.      As part of my medical decision making, I reviewed the following data within the [facility] notes reviewed and incorporated, Labs reviewed, notes from prior ED visits and [place] Controlled Substance Database   ____________________________________________   FINAL CLINICAL IMPRESSION(S) / ED DIAGNOSES  Final diagnoses:  Symptomatic anemia      NEW MEDICATIONS STARTED DURING THIS VISIT:  New Prescriptions   No medications on file     Note:  This document was prepared using Dragon voice recognition  software and may include unintentional dictation errors.    Merlyn Lot, MD 07/19/18 [phone]

## 2018-07-19 NOTE — ED Notes (Signed)
Pt c/o L/leg pain 9/10. PT given med (see MAR). Pt fell asleep while this RN was drawing blood. RR are even an unlabored. NAD noted at this time. Family at bedside.

## 2018-07-19 NOTE — Progress Notes (Signed)
PHARMACIST - PHYSICIAN ORDER COMMUNICATION  CONCERNING: P&T Medication Policy on Herbal Medications  DESCRIPTION:  This patient's order for:  Glucosamine-chondroitin ,  Biotin  has been noted.  This product(s) is classified as an "herbal" or natural product. Due to a lack of definitive safety studies or FDA approval, nonstandard manufacturing practices, plus the potential risk of unknown drug-drug interactions while on inpatient medications, the Pharmacy and Therapeutics Committee does not permit the use of "herbal" or natural products of this type within [facility].   ACTION TAKEN: The pharmacy department is unable to verify this order at this time and your patient has been informed of this safety policy. Please reevaluate patient's clinical condition at discharge and address if the herbal or natural product(s) should be resumed at that time.

## 2018-07-20 ENCOUNTER — Observation Stay: Payer: Medicare Other

## 2018-07-20 DIAGNOSIS — D649 Anemia, unspecified: Secondary | ICD-10-CM | POA: Diagnosis not present

## 2018-07-20 LAB — BPAM RBC
Blood Product Expiration Date: 202002032359
ISSUE DATE / TIME: 202001282200
Unit Type and Rh: 600

## 2018-07-20 LAB — TYPE AND SCREEN
ABO/RH(D): A NEG
Antibody Screen: NEGATIVE
Unit division: 0

## 2018-07-20 LAB — GLUCOSE, CAPILLARY
Glucose-Capillary: 103 mg/dL — ABNORMAL HIGH (ref 70–99)
Glucose-Capillary: 128 mg/dL — ABNORMAL HIGH (ref 70–99)
Glucose-Capillary: 109 mg/dL — ABNORMAL HIGH (ref 70–99)

## 2018-07-20 LAB — HEMOGLOBIN AND HEMATOCRIT, BLOOD
HCT: 27.5 % — ABNORMAL LOW (ref 36.0–46.0)
Hemoglobin: 8.5 g/dL — ABNORMAL LOW (ref 12.0–15.0)

## 2018-07-20 MED ORDER — ACETAMINOPHEN 325 MG PO TABS: 650.0000 mg | ORAL_TABLET | Freq: Four times a day (QID) | ORAL | Status: DC | PRN

## 2018-07-20 MED ORDER — ORAL CARE MOUTH RINSE
15.0000 mL | Freq: Two times a day (BID) | OROMUCOSAL | Status: DC
  Administered 2018-07-20: 13:00:00 15 mL via OROMUCOSAL

## 2018-07-20 NOTE — Discharge Instructions (Signed)
Follow-up with primary care physician in 3 to 4 days.  Please repeat CBC during follow-up visit.  Please get right breast ultrasound in a month for follow-up as recommended by radiologist Follow-up with back surgeon as recommended

## 2018-07-20 NOTE — Progress Notes (Signed)
Pt told this nurse about a lump on her left breast. This nurse felt a lump on the top of her left breast that was moveable. Skin on the breast was red as well. Pt states that the left breast is sore. Pt said that she does not do breast self examinations and said that it appeared after her recent spinal surgery. Will pass this information onto the upcoming shift and will continue to monitor for increased pain.

## 2018-07-20 NOTE — Evaluation (Signed)
Physical Therapy Evaluation Patient Details Name: Valerie Duran MRN: [id number] DOB: 03-31-1946 Today's Date: 07/20/2018   History of Present Illness  Patient is 73 yo female admitted for anemia, s/p transfusion. Recent T10-S1 PLIF (07/06/2018), PMH of arthritis, bil TKA, R THA, depression, DM  Clinical Impression  Patient alert, oriented, sitting in chair at start of session with TLSO brace in place. Pt reported donning without assistance. The patient reported that she lives in one story home with husband who is available 24/7, and prior to recent surgery was independent. Pt still currently independent but utilizing adaptive equipment and RW for ambulation.  The patient demonstrated transfers with supervision-mod I with RW as well as ambulated ~223f without complaint, HR and spO2 stable. The patient exhibited decreased stride length bilaterally as well as decreased gait velocity, but overall safe and steady. The patient was able to verbalize back precautions without assistance and maintained throughout session. Overall the patient demonstrated limitations in functional activities, gait, and strength compared to PLOF and would benefit from skilled PT intervention to return to baseline as able.    Follow Up Recommendations Home health PT    Equipment Recommendations  None recommended by PT;Other (comment)(pt has RW)    Recommendations for Other Services       Precautions / Restrictions Precautions Precautions: Fall;Back Precaution Comments: BLT precautions, lumbar brace on for all mobility Required Braces or Orthoses: Spinal Brace Spinal Brace: Thoracolumbosacral orthotic Restrictions Weight Bearing Restrictions: No Other Position/Activity Restrictions: Pt wearing brace, sitting in chair at start of session      Mobility  Bed Mobility               General bed mobility comments: deferred, up in chair  Transfers   Equipment used: Rolling walker (2 wheeled) Transfers:  Sit to/from Stand Sit to Stand: Supervision;Modified independent (Device/Increase time)            Ambulation/Gait Ambulation/Gait assistance: Supervision;Min guard Gait Distance (Feet): 210 Feet Assistive device: Rolling walker (2 wheeled) Gait Pattern/deviations: Step-through pattern;Decreased stride length Gait velocity: decreased   General Gait Details: Pt without complaint during ambulation, spO2 and HR stable. Safe use of RW with minimal verbal cues  Stairs            Wheelchair Mobility    Modified Rankin (Stroke Patients Only)       Balance Overall balance assessment: Modified Independent                                           Pertinent Vitals/Pain Pain Assessment: 0-10 Pain Score: 4  Pain Location: incision, lateral trunk both L and R Pain Descriptors / Indicators: Aching;Sore Pain Intervention(s): Limited activity within patient's tolerance;Monitored during session;Repositioned    Home Living Family/patient expects to be discharged to:: Private residence Living Arrangements: Spouse/significant other Available Help at Discharge: Family Type of Home: House Home Access: Level entry     Home Layout: One level Home Equipment: Shower seat - built in;Cane - single point;Walker - 2 wheels;Wheelchair - manual;Adaptive equipment(sock aid)      Prior Function Level of Independence: Independent         Comments: no falls in the last 6 months     Hand Dominance   Dominant Hand: Right    Extremity/Trunk Assessment   Upper Extremity Assessment Upper Extremity Assessment: Overall WFL for tasks assessed    Lower Extremity  Assessment Lower Extremity Assessment: RLE deficits/detail;LLE deficits/detail RLE Deficits / Details: hip flexion 3+5, grossly 4/5 for rest LLE Deficits / Details: hip flexion 3+/5, grossly 4/5 for rest       Communication   Communication: No difficulties  Cognition Arousal/Alertness:  Awake/alert Behavior During Therapy: WFL for tasks assessed/performed Overall Cognitive Status: Within Functional Limits for tasks assessed                                        General Comments      Exercises     Assessment/Plan    PT Assessment Patient needs continued PT services  PT Problem List Decreased activity tolerance;Decreased safety awareness;Decreased mobility;Decreased knowledge of use of DME;Cardiopulmonary status limiting activity;Decreased knowledge of precautions;Pain;Decreased strength;Decreased balance       PT Treatment Interventions Gait training;Therapeutic activities;Therapeutic exercise;DME instruction;Functional mobility training;Balance training;Patient/family education    PT Goals (Current goals can be found in the Care Plan section)  Acute Rehab PT Goals Patient Stated Goal: to go home PT Goal Formulation: With patient Time For Goal Achievement: 08/03/18 Potential to Achieve Goals: Good    Frequency Min 2X/week   Barriers to discharge        Co-evaluation               AM-PAC PT "6 Clicks" Mobility  Outcome Measure Help needed turning from your back to your side while in a flat bed without using bedrails?: None Help needed moving from lying on your back to sitting on the side of a flat bed without using bedrails?: None Help needed moving to and from a bed to a chair (including a wheelchair)?: None Help needed standing up from a chair using your arms (e.g., wheelchair or bedside chair)?: None Help needed to walk in hospital room?: A Little Help needed climbing 3-5 steps with a railing? : A Little 6 Click Score: 22    End of Session Equipment Utilized During Treatment: Back brace;Gait belt Activity Tolerance: Patient tolerated treatment well Patient left: in chair;with call bell/phone within reach;Other (comment)(Pt sitting in normal chair for back support (pt choice)) Nurse Communication: Mobility status PT Visit  Diagnosis: Other abnormalities of gait and mobility (R26.89);Muscle weakness (generalized) (M62.81);Pain Pain - Right/Left: (low back pain)    Time: 0511-0211 PT Time Calculation (min) (ACUTE ONLY): 29 min   Charges:   PT Evaluation $PT Eval Low Complexity: 1 Low PT Treatments $Therapeutic Activity: 8-22 mins       Lieutenant Diego PT, DPT 9:17 AM,07/20/18 [phone]

## 2018-07-20 NOTE — Discharge Summary (Signed)
[facility] [place] NAME: Valerie Duran    MR#:  [id number]  DATE OF BIRTH:  02/05/1946  DATE OF ADMISSION:  07/19/2018 ADMITTING PHYSICIAN: Fritzi Mandes, MD  DATE OF DISCHARGE: 07/20/2018  PRIMARY CARE PHYSICIAN: Derinda Late, MD    ADMISSION DIAGNOSIS:  Swelling [R60.9] Symptomatic anemia [D64.9]  DISCHARGE DIAGNOSIS:  Symptomatic anemia from recent blood loss during back surgery Right breast lump-benign  SECONDARY DIAGNOSIS:   Past Medical History:  Diagnosis Date  . Anemia   . Arthritis   . Back pain   . Celiac disease   . Depression   . Diabetes mellitus without complication ([place])   . GERD (gastroesophageal reflux disease)   . Heart murmur   . MVP (mitral valve prolapse)   . RLS (restless legs syndrome)     HOSPITAL COURSE:  HPI   Valerie Duran  is a 73 y.o. female with a known history of degenerative spine status post recent fusion of spine/extensive surgery done in mid January. Patient has been getting physical therapy at home. She started noticing swelling in both lower extremities. She had blood work done which showed hemoglobin of 7.7. Patient came to the emergency room for further evaluation of management.  She has had similar presentation in the past when she had hip replacement surgery when she became anemic postop and had swelling of both lower extremities. Patient got blood transfusion at that time as well.  Last hemoglobin was 9.3 mid-January when she was discharged to home.  He has had EGD and colonoscopy in August 2019  It is being admitted for blood transfusion. Denies any chest pain or shortness of breath.  1. Symptomatic anemia with leg swelling suspected due to recent spinal surgery -patient has chronic anemia her last hemoglobin was 9.3 -came in with hemoglobin of 7.7-lying blood transfusion hemoglobin went up to 8.5 -continue iron pills -patient had G.I. workup with EGD in August 2019 that  showed gastritis and colonoscopy showed some polyps and diverticulosis.  2. Bilateral lower extremity edema -ultrasound Doppler the lower extremity negative DVT -patient advised Ted hose if possible to wear to help with leg swelling, keep legs elevated  3. Diabetes continue home meds  4. Recent spinal surgery -continue physical therapy and occupational therapy at home Home health PT -Outpatient follow-up with the spinal surgeon as recommended  5.   Right breast lumps ultrasound of the right breast has revealed benign swelling-intraparenchymal bruising.  Radiologist recommended repeat ultrasound of the right breast in 1 month for follow-up Tylenol as needed  DVT prophylaxis SCD's DISCHARGE CONDITIONS:   Stable  CONSULTS OBTAINED:     PROCEDURES blood transfusion  DRUG ALLERGIES:   Allergies  Allergen Reactions  . Ambien [Zolpidem Tartrate] Other (See Comments)    Altered mental status  . Barley Grass Other (See Comments)    Celiac disease  . Wheat Bran Rash    Celiac disease  . Tape Other (See Comments)    Adhesive tape - tears skin  . Codeine Nausea Only  . Ibuprofen Other (See Comments) and Rash    GI upset " makes my stomach hurt"  . Simvastatin Rash  . Valium [Diazepam] Other (See Comments)    Sleep walks    DISCHARGE MEDICATIONS:   Allergies as of 07/20/2018      Reactions   Ambien [zolpidem Tartrate] Other (See Comments)   Altered mental status   Barley Grass Other (See Comments)   Celiac disease  Wheat Bran Rash   Celiac disease   Tape Other (See Comments)   Adhesive tape - tears skin   Codeine Nausea Only   Ibuprofen Other (See Comments), Rash   GI upset " makes my stomach hurt"   Simvastatin Rash   Valium [diazepam] Other (See Comments)   Sleep walks      Medication List    TAKE these medications   acetaminophen 325 MG tablet Commonly known as:  TYLENOL Take 2 tablets (650 mg total) by mouth every 6 (six) hours as needed for mild  pain (or Fever >/= 101).   amoxicillin 500 MG tablet Commonly known as:  AMOXIL Take 2,000 mg by mouth See admin instructions. Take 2000 mg by mouth prior to dental appointments   Biotin 10000 MCG Tabs Take 20,000 mcg by mouth daily.   cyclobenzaprine 10 MG tablet Commonly known as:  FLEXERIL Take 1 tablet (10 mg total) by mouth 3 (three) times daily as needed for muscle spasms.   docusate sodium 100 MG capsule Commonly known as:  COLACE Take 1 capsule (100 mg total) by mouth 2 (two) times daily.   ferrous sulfate 325 (65 FE) MG tablet Take 1 tablet (325 mg total) by mouth daily with breakfast.   fexofenadine 180 MG tablet Commonly known as:  ALLEGRA Take 180 mg by mouth daily.   fluticasone 50 MCG/ACT nasal spray Commonly known as:  FLONASE Place 2 sprays into both nostrils daily.   Glucosamine-Chondroitin 750-600 MG Tabs Take 2 tablets by mouth daily.   levothyroxine 50 MCG tablet Commonly known as:  SYNTHROID, LEVOTHROID Take 50 mcg by mouth daily before breakfast.   Magnesium 500 MG Caps Take 500 mg by mouth daily.   metFORMIN 500 MG 24 hr tablet Commonly known as:  GLUCOPHAGE-XR Take 500 mg by mouth daily with breakfast.   oxyCODONE 5 MG immediate release tablet Commonly known as:  Oxy IR/ROXICODONE Take 1 tablet (5 mg total) by mouth every 4 (four) hours as needed for moderate pain ((score 4 to 6)).   pramipexole 1 MG tablet Commonly known as:  MIRAPEX Take 1 mg by mouth 2 (two) times daily.        DISCHARGE INSTRUCTIONS:   Follow-up with primary care physician in 3 to 4 days.  Please repeat CBC during follow-up visit.  Please get right breast ultrasound in a month for follow-up as recommended by radiologist Follow-up with back surgeon as recommended  DIET:  Diabetic diet  DISCHARGE CONDITION:  Stable  ACTIVITY:  Activity as tolerated per PT  OXYGEN:  Home Oxygen: No.   Oxygen Delivery: room air  DISCHARGE LOCATION:  home   If you  experience worsening of your admission symptoms, develop shortness of breath, life threatening emergency, suicidal or homicidal thoughts you must seek medical attention immediately by calling 911 or calling your MD immediately  if symptoms less severe.  You Must read complete instructions/literature along with all the possible adverse reactions/side effects for all the Medicines you take and that have been prescribed to you. Take any new Medicines after you have completely understood and accpet all the possible adverse reactions/side effects.   Please note  You were cared for by a hospitalist during your hospital stay. If you have any questions about your discharge medications or the care you received while you were in the hospital after you are discharged, you can call the unit and asked to speak with the hospitalist on call if the hospitalist that took care of  you is not available. Once you are discharged, your primary care physician will handle any further medical issues. Please note that NO REFILLS for any discharge medications will be authorized once you are discharged, as it is imperative that you return to your primary care physician (or establish a relationship with a primary care physician if you do not have one) for your aftercare needs so that they can reassess your need for medications and monitor your lab values.     Today  Chief Complaint  Patient presents with  . Abnormal Lab   Patient is doing fine.  Last mammogram was approximately less than 1 year ago and was normal as reported by the patient.  Concerned about small palpable lumps in the right breast-ultrasound revealed benign lumps Patient denies any chest pain or shortness of breath and hemoglobin is up to 8.5 and wants to go home  ROS:  CONSTITUTIONAL: Denies fevers, chills. Denies any fatigue, weakness.  EYES: Denies blurry vision, double vision, eye pain. EARS, NOSE, THROAT: Denies tinnitus, ear pain, hearing  loss. RESPIRATORY: Denies cough, wheeze, shortness of breath.  CARDIOVASCULAR: Denies chest pain, palpitations, edema.  Reporting right breast lumps GASTROINTESTINAL: Denies nausea, vomiting, diarrhea, abdominal pain. Denies bright red blood per rectum.  Recent back surgery December 2019 GENITOURINARY: Denies dysuria, hematuria. ENDOCRINE: Denies nocturia or thyroid problems. HEMATOLOGIC AND LYMPHATIC: Denies easy bruising or bleeding. SKIN: Denies rash or lesion. MUSCULOSKELETAL: Denies pain in neck, back, shoulder, knees, hips or arthritic symptoms.  NEUROLOGIC: Denies paralysis, paresthesias.  PSYCHIATRIC: Denies anxiety or depressive symptoms.   VITAL SIGNS:  Blood pressure (!) 144/73, pulse 91, temperature 98.1 F (36.7 C), temperature source Oral, resp. rate (!) 23, height 5' 8"  (1.727 m), weight 89.8 kg, SpO2 100 %.  I/O:    Intake/Output Summary (Last 24 hours) at 07/20/2018 1401 Last data filed at 07/20/2018 0842 Gross per 24 hour  Intake 380 ml  Output -  Net 380 ml    PHYSICAL EXAMINATION:   GENERAL:  73 y.o.-year-old patient lying in the bed with no acute distress.  EYES: Pupils equal, round, reactive to light and accommodation. No scleral icterus. Extraocular muscles intact.  HEENT: Head atraumatic, normocephalic. Oropharynx and nasopharynx clear.  NECK:  Supple, no jugular venous distention. No thyroid enlargement, no tenderness.  LUNGS: Normal breath sounds bilaterally, no wheezing, rales,rhonchi or crepitation. No use of accessory muscles of respiration.  CARDIOVASCULAR: S1, S2 normal. No murmurs, rubs, or gallops.  Breast-right breast with 2 small palpable tender lumps in 1 to 2 o'clock position ABDOMEN: Soft, nontender, nondistended. Bowel sounds present.  EXTREMITIES: +++ pedal edema,no cyanosis, or clubbing.  Intact back brace NEUROLOGIC: Awake alert and oriented x3 sensation intact. Gait not checked.  PSYCHIATRIC: The patient is alert and oriented x 3.   SKIN: No obvious rash, lesion, or ulcer.  DATA REVIEW:   CBC Recent Labs  Lab 07/19/18 1341 07/20/18 0458  WBC 8.4  --   HGB 7.7* 8.5*  HCT 25.0* 27.5*  PLT 623*  --     Chemistries  Recent Labs  Lab 07/19/18 1341  NA 131*  K 4.4  CL 98  CO2 27  GLUCOSE 140*  BUN 9  CREATININE 0.53  CALCIUM 8.4*    Cardiac Enzymes No results for input(s): TROPONINI in the last 168 hours.  Microbiology Results  Results for orders placed or performed during the hospital encounter of 07/06/18  MRSA PCR Screening     Status: None   Collection Time: 07/06/18  9:51 PM  Result Value Ref Range Status   MRSA by PCR NEGATIVE NEGATIVE Final    Comment:        The GeneXpert MRSA Assay (FDA approved for NASAL specimens only), is one component of a comprehensive MRSA colonization surveillance program. It is not intended to diagnose MRSA infection nor to guide or monitor treatment for MRSA infections. Performed at [facility], [place] [address], [place], [place] [address]     RADIOLOGY:  US Venous Img Lower Bilateral  Result Date: 07/20/2018 CLINICAL DATA:  73 year old female with a history of bilateral lower extremity swelling EXAM: BILATERAL LOWER EXTREMITY VENOUS DOPPLER ULTRASOUND TECHNIQUE: Gray-scale sonography with graded compression, as well as color Doppler and duplex ultrasound were performed to evaluate the lower extremity deep venous systems from the level of the common femoral vein and including the common femoral, femoral, profunda femoral, popliteal and calf veins including the posterior tibial, peroneal and gastrocnemius veins when visible. The superficial great saphenous vein was also interrogated. Spectral Doppler was utilized to evaluate flow at rest and with distal augmentation maneuvers in the common femoral, femoral and popliteal veins. COMPARISON:  None. FINDINGS: RIGHT LOWER EXTREMITY Common Femoral Vein: No evidence of thrombus. Normal compressibility, respiratory  phasicity and response to augmentation. Saphenofemoral Junction: No evidence of thrombus. Normal compressibility and flow on color Doppler imaging. Profunda Femoral Vein: No evidence of thrombus. Normal compressibility and flow on color Doppler imaging. Femoral Vein: No evidence of thrombus. Normal compressibility, respiratory phasicity and response to augmentation. Popliteal Vein: No evidence of thrombus. Normal compressibility, respiratory phasicity and response to augmentation. Calf Veins: No evidence of thrombus. Normal compressibility and flow on color Doppler imaging. Superficial Great Saphenous Vein: No evidence of thrombus. Normal compressibility and flow on color Doppler imaging. Other Findings:  None. LEFT LOWER EXTREMITY Common Femoral Vein: No evidence of thrombus. Normal compressibility, respiratory phasicity and response to augmentation. Saphenofemoral Junction: No evidence of thrombus. Normal compressibility and flow on color Doppler imaging. Profunda Femoral Vein: No evidence of thrombus. Normal compressibility and flow on color Doppler imaging. Femoral Vein: No evidence of thrombus. Normal compressibility, respiratory phasicity and response to augmentation. Popliteal Vein: No evidence of thrombus. Normal compressibility, respiratory phasicity and response to augmentation. Calf Veins: No evidence of thrombus. Normal compressibility and flow on color Doppler imaging. Superficial Great Saphenous Vein: No evidence of thrombus. Normal compressibility and flow on color Doppler imaging. Other Findings:  None. IMPRESSION: Sonographic survey of the bilateral lower extremities negative for DVT Electronically Signed   By: Corrie Mckusick D.O.   On: 07/20/2018 10:54   US Breast Ltd Uni Right Inc Axilla  Result Date: 07/20/2018 CLINICAL DATA:  Right inner breast areas of palpable concern felt by the patient 2 days after spinal surgery. EXAM: ULTRASOUND OF THE RIGHT BREAST COMPARISON:  Previous exam(s).  FINDINGS: On physical exam, there is approximately 5-6 cm area of nodularity in the inner central right breast. Targeted ultrasound is performed, showing an area of intraparenchymal bleeding/bruising in the right breast centered at 2 to 3 o'clock. This area corresponds to the palpable nodules indicated by the patient. IMPRESSION: Right inner breast palpable area which is suggestive of intraparenchymal bruising by sonographic appearance. RECOMMENDATION: Follow-up with focused right breast ultrasound in 1 month, to assure diminish or resolution of this abnormality. I have discussed the findings and recommendations with the patient. Results were also provided in writing at the conclusion of the visit. If applicable, a reminder letter will be sent to  the patient regarding the next appointment. BI-RADS CATEGORY  3: Probably benign. Electronically Signed   By: Fidela Salisbury M.D.   On: 07/20/2018 11:37    EKG:   Orders placed or performed in visit on 03/01/18  . EKG 12-Lead      Management plans discussed with the patient, family and they are in agreement.  CODE STATUS:     Code Status Orders  (From admission, onward)         Start     Ordered   07/19/18 1852  Full code  Continuous     07/19/18 1851        Code Status History    Date Active Date Inactive Code Status Order ID Comments User Context   07/06/2018 2146 07/09/2018 1803 Full Code [id number]  Newman Pies, MD Inpatient   01/14/2015 1849 01/17/2015 1349 Full Code [id number]  Dereck Leep, MD Inpatient    Advance Directive Documentation     Most Recent Value  Type of Advance Directive  Healthcare Power of [place], Living will  Pre-existing out of facility DNR order (yellow form or pink MOST form)  -  "MOST" Form in Place?  -      TOTAL TIME TAKING CARE OF THIS PATIENT: 43 minutes.   Note: This dictation was prepared with Dragon dictation along with smaller phrase technology. Any transcriptional errors that result  from this process are unintentional.   @[facility] @  on 07/20/2018 at 2:01 PM  Between 7am to 6pm - Pager - [phone]  After 6pm go to [email or web address] - password [email or web address] [facility]  [facility]  Office  [phone]  CC: Primary care physician; Derinda Late, MD

## 2018-07-20 NOTE — Care Management Note (Signed)
Case Management Note  Patient Details  Name: Valerie Duran MRN: [id number] Date of Birth: December 22, 1945  Subjective/Objective:                   Met with the patient and family to discuss DC plan and needs Patient states that she needs a 3 in 1, I notified [facility] Cornea of the need Patient provided witht he [facility] list per CMS.gov Patient has [facility] in place and would like to continue using them, notified Helene Kelp with [facility] of the choice, Dorna Bloom accepted. Alerted the Doctor that a resumption of care order may be needed Patient has a walker at home that she uses Patient lives with spouse and has no needs for transportation Patient has Dr. Merlene Morse as PCP Patient uses [facility] as a pharmacy and can afford the medications with insurance Patient has no other CM needs at this time Action/Plan: [facility] list provided per CMS.gov Notified teresa with [facility] that the patient would like to resume with them for [facility]    Expected Discharge Date:  07/20/18               Expected Discharge Plan:     In-House Referral:     Discharge planning Services  CM Consult  Post Acute Care Choice:    Choice offered to:     DME Arranged:    DME Agency:     HH Arranged:  PT [place]:  [facility] ([facility])  Status of Service:  Completed, signed off  If discussed at [employer] of Stay Meetings, dates discussed:    Additional Comments:  Su Hilt, RN 07/20/2018, 1:59 PM

## 2018-07-20 NOTE — Care Management (Signed)
[facility] Cornea took the 3 in 1 to the room

## 2018-07-20 NOTE — Plan of Care (Signed)

## 2018-09-10 ENCOUNTER — Telehealth: Payer: Self-pay | Admitting: Oncology

## 2018-09-10 NOTE — Telephone Encounter (Signed)
Spoke with patient about appt r\s due to Infectious control precautions. Pt was very understanding. Moved out 4-6 weeks.

## 2018-09-15 ENCOUNTER — Inpatient Hospital Stay: Payer: Medicare Other

## 2018-09-15 ENCOUNTER — Inpatient Hospital Stay: Payer: Medicare Other | Admitting: Oncology

## 2018-10-27 ENCOUNTER — Other Ambulatory Visit: Payer: Self-pay

## 2018-10-27 NOTE — Progress Notes (Signed)
[place]  [phone] Fax:(336) [phone]  ID: Valerie Duran OB: 1945-10-09  MR#: [id number]  ZJQ#:[id number]  Patient Care Team: Derinda Late, MD as PCP - General (Family Medicine) Marry Guan, Laurice Record, MD (Orthopedic Surgery)  I connected with Valerie Duran on 10/28/18 at  1:00 PM EDT by telephone visit and verified that I am speaking with the correct person using two identifiers.   I discussed the limitations, risks, security and privacy concerns of performing an evaluation and management service by telemedicine and the availability of in-person appointments. I also discussed with the patient that there may be a patient responsible charge related to this service. The patient expressed understanding and agreed to proceed.   Other persons participating in the visit and their role in the encounter: Patient, MD   Patient's location: Home  Provider's location: Clinic   CHIEF COMPLAINT: Iron deficiency anemia  INTERVAL HISTORY: Patient agreed to evaluation and discussion of her laboratory work via telephone today. She had a major back operation in January which required post-op blood transfusion.  She still has fatigue, but this is much improved. Her pain has resolved.  She has no neurologic complaints.  She denies any recent fevers or illnesses.  She has a good appetite and denies weight loss. She has no chest pain, shortness of breath, cough, or hemoptysis. She denies any nausea, vomiting, constipation, or diarrhea.  She denies any melena or hematochezia.  She has no urinary complaints. Patient offers no further specific complaints today.  REVIEW OF SYSTEMS:   Review of Systems  Constitutional: Positive for malaise/fatigue. Negative for fever and weight loss.  Respiratory: Negative.  Negative for cough, hemoptysis and shortness of breath.   Cardiovascular: Negative.  Negative for chest pain and leg swelling.  Gastrointestinal: Negative.  Negative for  abdominal pain, blood in stool and melena.  Genitourinary: Negative.  Negative for hematuria.  Musculoskeletal: Negative.  Negative for back pain and myalgias.  Skin: Negative.  Negative for rash.  Neurological: Negative.  Negative for sensory change, focal weakness, weakness and headaches.  Psychiatric/Behavioral: Negative.  The patient is not nervous/anxious.     As per HPI. Otherwise, a complete review of systems is negative.  PAST MEDICAL HISTORY: Past Medical History:  Diagnosis Date  . Anemia   . Arthritis   . Back pain   . Celiac disease   . Depression   . Diabetes mellitus without complication ([place])   . GERD (gastroesophageal reflux disease)   . Heart murmur   . MVP (mitral valve prolapse)   . RLS (restless legs syndrome)     PAST SURGICAL HISTORY: Past Surgical History:  Procedure Laterality Date  . ABDOMINAL HYSTERECTOMY    . APPLICATION OF INTRAOPERATIVE CT SCAN N/A 07/06/2018   Procedure: APPLICATION OF INTRAOPERATIVE CT SCAN;  Surgeon: Newman Pies, MD;  Location: [place];  Service: Neurosurgery;  Laterality: N/A;  . BACK SURGERY     spinal fusion  . CATARACT EXTRACTION W/ INTRAOCULAR LENS  IMPLANT, BILATERAL Bilateral   . [place]  . COLONOSCOPY WITH PROPOFOL N/A 02/04/2018   Procedure: COLONOSCOPY WITH PROPOFOL;  Surgeon: Manya Silvas, MD;  Location[facility] ENDOSCOPY;  Service: Endoscopy;  Laterality: N/A;  . ESOPHAGOGASTRODUODENOSCOPY (EGD) WITH PROPOFOL N/A 02/04/2018   Procedure: ESOPHAGOGASTRODUODENOSCOPY (EGD) WITH PROPOFOL;  Surgeon: Manya Silvas, MD;  Location: [facility] ENDOSCOPY;  Service: Endoscopy;  Laterality: N/A;  . FOOT SURGERY Right 2007  . HARDWARE REMOVAL Right 01/14/2015  Procedure: HARDWARE REMOVAL/ REMOVAL OF 3 CANNULATED SCREWS FROM RIGHT HIP.;  Surgeon: Dereck Leep, MD;  Location: [facility] ORS;  Service: Orthopedics;  Laterality: Right;  . HIP FRACTURE SURGERY Right 2016  . JOINT REPLACEMENT Bilateral 2014    knees  . NECK SURGERY     fusion  . TOTAL HIP ARTHROPLASTY Right 01/14/2015   Procedure: TOTAL HIP ARTHROPLASTY;  Surgeon: Dereck Leep, MD;  Location: [facility] ORS;  Service: Orthopedics;  Laterality: Right;    FAMILY HISTORY: Family History  Problem Relation Age of Onset  . Cancer Father 71       Colon Cancer  . Breast cancer Father 5  . Breast cancer Sister   . Stroke Mother   . Heart disease Maternal Grandmother     ADVANCED DIRECTIVES (Y/N):  N  HEALTH MAINTENANCE: Social History   Tobacco Use  . Smoking status: Never Smoker  . Smokeless tobacco: Never Used  Substance Use Topics  . Alcohol use: No  . Drug use: No     Colonoscopy:  PAP:  Bone density:  Lipid panel:  Allergies  Allergen Reactions  . Ambien [Zolpidem Tartrate] Other (See Comments)    Altered mental status  . Barley Grass Other (See Comments)    Celiac disease  . Wheat Bran Rash    Celiac disease  . Tape Other (See Comments)    Adhesive tape - tears skin  . Codeine Nausea Only  . Ibuprofen Other (See Comments) and Rash    GI upset " makes my stomach hurt"  . Simvastatin Rash  . Valium [Diazepam] Other (See Comments)    Sleep walks    Current Outpatient Medications  Medication Sig Dispense Refill  . acetaminophen (TYLENOL) 325 MG tablet Take 2 tablets (650 mg total) by mouth every 6 (six) hours as needed for mild pain (or Fever >/= 101).    . Biotin 10000 MCG TABS Take 20,000 mcg by mouth daily.     . fexofenadine (ALLEGRA) 180 MG tablet Take 180 mg by mouth daily.    . fluticasone (FLONASE) 50 MCG/ACT nasal spray Place 2 sprays into both nostrils daily.     . furosemide (LASIX) 20 MG tablet Take 1 tablet by mouth 1 day or 1 dose.    . Glucosamine-Chondroitin 750-600 MG TABS Take 2 tablets by mouth daily.     Marland Kitchen levothyroxine (SYNTHROID, LEVOTHROID) 50 MCG tablet Take 50 mcg by mouth daily before breakfast.     . Magnesium 500 MG CAPS Take 500 mg by mouth daily.     . metFORMIN  (GLUCOPHAGE-XR) 500 MG 24 hr tablet Take 500 mg by mouth daily with breakfast.     . potassium chloride (K-DUR) 10 MEQ tablet Take 1 tablet by mouth 1 day or 1 dose.    . pramipexole (MIRAPEX) 1 MG tablet Take 1 mg by mouth 2 (two) times daily.      No current facility-administered medications for this visit.     OBJECTIVE: There were no vitals filed for this visit.   There is no height or weight on file to calculate BMI.    ECOG FS:0 - Asymptomatic  LAB RESULTS:  Lab Results  Component Value Date   NA 131 (L) 07/19/2018   K 4.4 07/19/2018   CL 98 07/19/2018   CO2 27 07/19/2018   GLUCOSE 140 (H) 07/19/2018   BUN 9 07/19/2018   CREATININE 0.53 07/19/2018   CALCIUM 8.4 (L) 07/19/2018   PROT 7.6 12/10/2016  ALBUMIN 3.6 12/10/2016   AST 25 12/10/2016   ALT 16 12/10/2016   ALKPHOS 83 12/10/2016   BILITOT 0.4 12/10/2016   GFRNONAA >60 07/19/2018   GFRAA >60 07/19/2018    Lab Results  Component Value Date   WBC 7.3 10/28/2018   NEUTROABS 4.7 10/28/2018   HGB 11.3 (L) 10/28/2018   HCT 35.6 (L) 10/28/2018   MCV 83.8 10/28/2018   PLT 365 10/28/2018   Lab Results  Component Value Date   IRON 30 10/28/2018   TIBC 331 10/28/2018   IRONPCTSAT 9 (L) 10/28/2018   Lab Results  Component Value Date   FERRITIN 27 10/28/2018     STUDIES: No results found.  ASSESSMENT: Iron deficiency anemia  PLAN:    1. Iron deficiency anemia: Patient's hemoglobin is decreased, but significantly improved since her discharge in January. Her iron stores are also decreased.  Previously, the remainder of her laboratory work was also either negative or within normal limits.  She last received IV Venofer in January 2019. Given th COVID-19 pandemic, will not pursue IV Venofer at this time.  Patient will return to clinic in 3 months for further evaluation and Venofer infusion.  2. Celiac disease: Continue monitoring and evaluation per GI.  Patient patient states because of this she has difficulty  absorbing oral iron supplementation. 3. Back surgery: Continue follow-up with orthopedics as scheduled.   I provided 25 minutes of non face-to-face telephone visit time during this encounter, and > 50% was spent counseling as documented under my assessment & plan.  Patient expressed understanding and was in agreement with this plan. She also understands that She can call clinic at any time with any questions, concerns, or complaints.    Lloyd Huger, MD   10/28/2018 1:15 PM

## 2018-10-28 ENCOUNTER — Inpatient Hospital Stay (HOSPITAL_BASED_OUTPATIENT_CLINIC_OR_DEPARTMENT_OTHER): Payer: Medicare Other | Admitting: Oncology

## 2018-10-28 ENCOUNTER — Ambulatory Visit: Payer: Medicare Other

## 2018-10-28 ENCOUNTER — Inpatient Hospital Stay: Payer: Medicare Other | Admitting: Oncology

## 2018-10-28 ENCOUNTER — Inpatient Hospital Stay: Payer: Medicare Other | Attending: Oncology

## 2018-10-28 ENCOUNTER — Other Ambulatory Visit: Payer: Self-pay

## 2018-10-28 ENCOUNTER — Encounter: Payer: Self-pay | Admitting: Oncology

## 2018-10-28 DIAGNOSIS — D509 Iron deficiency anemia, unspecified: Secondary | ICD-10-CM | POA: Diagnosis not present

## 2018-10-28 LAB — CBC WITH DIFFERENTIAL/PLATELET
Abs Immature Granulocytes: 0.01 10*3/uL (ref 0.00–0.07)
Basophils Absolute: 0.1 10*3/uL (ref 0.0–0.1)
Basophils Relative: 1 %
Eosinophils Absolute: 0.2 10*3/uL (ref 0.0–0.5)
Eosinophils Relative: 3 %
HCT: 35.6 % — ABNORMAL LOW (ref 36.0–46.0)
Hemoglobin: 11.3 g/dL — ABNORMAL LOW (ref 12.0–15.0)
Immature Granulocytes: 0 %
Lymphocytes Relative: 25 %
Lymphs Abs: 1.8 10*3/uL (ref 0.7–4.0)
MCH: 26.6 pg (ref 26.0–34.0)
MCHC: 31.7 g/dL (ref 30.0–36.0)
MCV: 83.8 fL (ref 80.0–100.0)
Monocytes Absolute: 0.5 10*3/uL (ref 0.1–1.0)
Monocytes Relative: 6 %
Neutro Abs: 4.7 10*3/uL (ref 1.7–7.7)
Neutrophils Relative %: 65 %
Platelets: 365 10*3/uL (ref 150–400)
RBC: 4.25 MIL/uL (ref 3.87–5.11)
RDW: 16.5 % — ABNORMAL HIGH (ref 11.5–15.5)
WBC: 7.3 10*3/uL (ref 4.0–10.5)
nRBC: 0 % (ref 0.0–0.2)

## 2018-10-28 LAB — FERRITIN: Ferritin: 27 ng/mL (ref 11–307)

## 2018-10-28 LAB — IRON AND TIBC
Iron: 30 ug/dL (ref 28–170)
Saturation Ratios: 9 % — ABNORMAL LOW (ref 10.4–31.8)
TIBC: 331 ug/dL (ref 250–450)
UIBC: 301 ug/dL

## 2018-10-28 NOTE — Progress Notes (Signed)
Patient stated that she stays weak and tired everyday. Patient had back surgery in January 2020 and was given blood since she had lost a lot of blood.

## 2018-11-07 ENCOUNTER — Telehealth: Payer: Self-pay | Admitting: *Deleted

## 2018-11-07 NOTE — Telephone Encounter (Signed)
She states that her Ferritin was only 27 and she wants to get iron infusion because she feels so bad

## 2018-11-07 NOTE — Telephone Encounter (Signed)
Patient called reporting that she had labs 4/27 and is not scheduled for iron inf until August. She is asking that that be moved up because she is "so weak I can hardly put one foot in front of the other."  CBC with Differential/Platelet  Order: [id number]  Status:  Final result  Visible to patient:  Yes (MyChart)  Next appt:  02/10/2019 at 12:45 PM in Oncology ([facility])  Dx:  Iron deficiency anemia, unspecified i...   Ref Range & Units 10d ago (10/28/18) 718moago (07/20/18) 340mogo (07/19/18) 73m90moo (07/07/18) 73mo21mo (07/06/18) 73mo 82mo(06/29/18) 18mo a28mo11/26/19)  WBC 4.0 - 10.5 K/uL 7.3   8.4  13.1High    6.0  5.4   RBC 3.87 - 5.11 MIL/uL 4.25   2.73Low   3.17Low    4.35  4.37   Hemoglobin 12.0 - 15.0 g/dL 11.3Low   8.5Low   7.7Low   9.3Low   9.2Low   13.1  12.7   HCT 36.0 - 46.0 % 35.6Low   27.5Low  CM 25.0Low   29.1Low   27.0Low   40.1  39.0   MCV 80.0 - 100.0 fL 83.8   91.6  91.8   92.2  89.2   MCH 26.0 - 34.0 pg 26.6   28.2  29.3   30.1  29.1   MCHC 30.0 - 36.0 g/dL 31.7   30.8  32.0   32.7  32.6   RDW 11.5 - 15.5 % 16.5High    13.7  13.0   12.5  12.2   Platelets 150 - 400 K/uL 365   623High   208   321  297   nRBC 0.0 - 0.2 % 0.0   0.0 CM 0.0 CM  0.0 CM 0.0   Neutrophils Relative % % 65       61   Neutro Abs 1.7 - 7.7 K/uL 4.7       3.3   Lymphocytes Relative % 25       28   Lymphs Abs 0.7 - 4.0 K/uL 1.8       1.5   Monocytes Relative % 6       7   Monocytes Absolute 0.1 - 1.0 K/uL 0.5       0.4   Eosinophils Relative % 3       2   Eosinophils Absolute 0.0 - 0.5 K/uL 0.2       0.1   Basophils Relative % 1       2   Basophils Absolute 0.0 - 0.1 K/uL 0.1       0.1   Immature Granulocytes % 0       0   Abs Immature Granulocytes 0.00 - 0.07 K/uL 0.01       0.01 CM  Comment: Performed at [facility] [id number]      139 R    Potassium      4.5 R    Glucose, Bld      218High  R    Resulting Agency  [facility]  [facility] CH CLIChain [facility] RocksLAB CH CLIClaremontLAB CH CLIGrantleyLAB CH CLIStrasburgLAB      Specimen Collected: 10/28/18 09:22  Last Resulted: 10/28/18 09:39     Lab Flowsheet    Order Details    View Encounter    Lab and Collection Details    Routing  Result History      CM=Additional commentsR=Reference range differs from displayed range      Other Results from 10/28/2018   Contains abnormal data Iron and TIBC  Order: [id number]   Status:  Final result  Visible to patient:  Yes (MyChart)  Next appt:  02/10/2019 at 12:45 PM in Oncology ([facility])  Dx:  Iron deficiency anemia, unspecified i...   Ref Range & Units 10d ago (10/28/18) 38moago (05/17/18) 112mogo (12/30/17) 1y50yro (08/02/17) 60yr335yr (03/11/17) 35yr 38yr(06/25/16) 116yr a38yr3/9/17)  Iron 28 - 170 ug/dL 30  41  51  59  65  52  66   TIBC 250 - 450 ug/dL 331  267  284  307  320  322  313   Saturation Ratios 10.4 - 31.8 % 9Low   15  18  19  20  16  21    UIBC ug/dL 301  226 CM 233 CM 248 CM 255  270  247   Comment: Performed at [facility] [address] [id number] Agency  [facility] CH CLIJer[facility] ShoreLAB CH CLIAlamoLAB CH CLIGriffithvilleLAB CH CLICoving[place]LAB CH CLIAristocrat RanchettesLAB CH CLICastlewoodLAB      Specimen Collected: 10/28/18 09:22  Last Resulted: 10/28/18 12:02     Lab Flowsheet    Order Details    View Encounter    Lab and Collection Details    Routing    Result History      CM=Additional comments        Ferritin  Order: [id number]:  Final result  Visible to patient:  Yes (MyChart)  Next appt:  02/10/2019 at 12:45 PM in Oncology ([facility])  Dx:  Iron deficiency anemia, unspecified i...   Ref Range & Units 10d ago (10/28/18) 9mo ag41mo1/26/19) 2mo ag57mo11/19) 60yr ago 38yr1/19) 60yr ago (85yr0/18) 60yr ago (9460yr18) 60yr ago (6/8116yr8)  Ferritin 11 - 307 ng/mL 27  60 CM 68 CM 105 CM 24  26  37   Comment: Performed at [facility] [id number] [facility]  CH CLIN LAB [facility] CLIN LAB [place] CLIN LAB [place] CLIN LAB [place] CLIN LAB [place] CLIN LAB [place] CLIN LAB [place]   Specimen Collected: 10/28/18 09:22  Last Resulted: 10/28/18 12:02

## 2018-11-07 NOTE — Telephone Encounter (Signed)
Patient informed and I asked that she call back if she has not heard from [place] in 2 weeks time

## 2018-11-07 NOTE — Telephone Encounter (Signed)
With a hbg of 11.3 and significantly improved, would like her to wait.

## 2018-11-07 NOTE — Telephone Encounter (Signed)
We will accommodate as soon as it it safe from a COVID-19 standpoint.

## 2018-11-29 ENCOUNTER — Other Ambulatory Visit: Payer: Self-pay

## 2018-11-30 ENCOUNTER — Inpatient Hospital Stay: Payer: Medicare Other | Attending: Oncology

## 2018-11-30 ENCOUNTER — Other Ambulatory Visit: Payer: Self-pay

## 2018-11-30 VITALS — BP 115/66 | HR 84 | Temp 97.7°F | Resp 18

## 2018-11-30 DIAGNOSIS — D649 Anemia, unspecified: Secondary | ICD-10-CM

## 2018-11-30 DIAGNOSIS — D509 Iron deficiency anemia, unspecified: Secondary | ICD-10-CM | POA: Insufficient documentation

## 2018-11-30 MED ORDER — IRON SUCROSE 20 MG/ML IV SOLN
200.0000 mg | Freq: Once | INTRAVENOUS | Status: AC
  Administered 2018-11-30: 200 mg via INTRAVENOUS
  Filled 2018-11-30: qty 10

## 2018-11-30 MED ORDER — SODIUM CHLORIDE 0.9 % IV SOLN
Freq: Once | INTRAVENOUS | Status: AC
  Administered 2018-11-30: 14:00:00 via INTRAVENOUS
  Filled 2018-11-30: qty 250

## 2018-12-06 ENCOUNTER — Other Ambulatory Visit: Payer: Self-pay

## 2018-12-07 ENCOUNTER — Inpatient Hospital Stay: Payer: Medicare Other

## 2018-12-07 ENCOUNTER — Other Ambulatory Visit: Payer: Self-pay

## 2018-12-07 VITALS — BP 125/69 | HR 85 | Temp 97.6°F | Resp 19

## 2018-12-07 DIAGNOSIS — D649 Anemia, unspecified: Secondary | ICD-10-CM

## 2018-12-07 DIAGNOSIS — D509 Iron deficiency anemia, unspecified: Secondary | ICD-10-CM | POA: Diagnosis not present

## 2018-12-07 MED ORDER — SODIUM CHLORIDE 0.9 % IV SOLN
Freq: Once | INTRAVENOUS | Status: AC
  Administered 2018-12-07: 14:00:00 via INTRAVENOUS
  Filled 2018-12-07: qty 250

## 2018-12-07 MED ORDER — IRON SUCROSE 20 MG/ML IV SOLN
200.0000 mg | Freq: Once | INTRAVENOUS | Status: AC
  Administered 2018-12-07: 200 mg via INTRAVENOUS
  Filled 2018-12-07: qty 10

## 2019-01-11 ENCOUNTER — Other Ambulatory Visit: Payer: Self-pay | Admitting: Family Medicine

## 2019-01-11 DIAGNOSIS — R928 Other abnormal and inconclusive findings on diagnostic imaging of breast: Secondary | ICD-10-CM

## 2019-01-30 ENCOUNTER — Other Ambulatory Visit: Payer: Self-pay

## 2019-01-30 ENCOUNTER — Ambulatory Visit
Admission: RE | Admit: 2019-01-30 | Discharge: 2019-01-30 | Disposition: A | Discharge status: Home / Self Care | Payer: Medicare Other | Source: Ambulatory Visit | Attending: Family Medicine | Admitting: Family Medicine

## 2019-01-30 DIAGNOSIS — R928 Other abnormal and inconclusive findings on diagnostic imaging of breast: Secondary | ICD-10-CM

## 2019-02-03 NOTE — Progress Notes (Deleted)
[place]  Telephone:(336) [phone] Fax:(336) [phone]  ID: Valerie Duran OB: 1946-04-18  MR#: [id number]  HRC#:[id number]  Patient Care Team: Derinda Late, MD as PCP - General (Family Medicine) Marry Guan, Laurice Record, MD (Orthopedic Surgery)  I connected with Valerie Duran on 02/03/19 at  1:00 PM EDT by telephone visit and verified that I am speaking with the correct person using two identifiers.   I discussed the limitations, risks, security and privacy concerns of performing an evaluation and management service by telemedicine and the availability of in-person appointments. I also discussed with the patient that there may be a patient responsible charge related to this service. The patient expressed understanding and agreed to proceed.   Other persons participating in the visit and their role in the encounter: Patient, MD   Patients location: Home  Providers location: Clinic   CHIEF COMPLAINT: Iron deficiency anemia  INTERVAL HISTORY: Patient agreed to evaluation and discussion of her laboratory work via telephone today. She had a major back operation in January which required post-op blood transfusion.  She still has fatigue, but this is much improved. Her pain has resolved.  She has no neurologic complaints.  She denies any recent fevers or illnesses.  She has a good appetite and denies weight loss. She has no chest pain, shortness of breath, cough, or hemoptysis. She denies any nausea, vomiting, constipation, or diarrhea.  She denies any melena or hematochezia.  She has no urinary complaints. Patient offers no further specific complaints today.  REVIEW OF SYSTEMS:   Review of Systems  Constitutional: Positive for malaise/fatigue. Negative for fever and weight loss.  Respiratory: Negative.  Negative for cough, hemoptysis and shortness of breath.   Cardiovascular: Negative.  Negative for chest pain and leg swelling.  Gastrointestinal: Negative.  Negative for  abdominal pain, blood in stool and melena.  Genitourinary: Negative.  Negative for hematuria.  Musculoskeletal: Negative.  Negative for back pain and myalgias.  Skin: Negative.  Negative for rash.  Neurological: Negative.  Negative for sensory change, focal weakness, weakness and headaches.  Psychiatric/Behavioral: Negative.  The patient is not nervous/anxious.     As per HPI. Otherwise, a complete review of systems is negative.  PAST MEDICAL HISTORY: Past Medical History:  Diagnosis Date   Anemia    Arthritis    Back pain    Celiac disease    Depression    Diabetes mellitus without complication ([facility])    GERD (gastroesophageal reflux disease)    Heart murmur    MVP (mitral valve prolapse)    RLS (restless legs syndrome)     PAST SURGICAL HISTORY: Past Surgical History:  Procedure Laterality Date   ABDOMINAL HYSTERECTOMY     APPLICATION OF INTRAOPERATIVE CT SCAN N/A 07/06/2018   Procedure: APPLICATION OF INTRAOPERATIVE CT SCAN;  Surgeon: Newman Pies, MD;  Location: [place];  Service: Neurosurgery;  Laterality: N/A;   BACK SURGERY     spinal fusion   CATARACT EXTRACTION W/ INTRAOCULAR LENS  IMPLANT, BILATERAL Bilateral    CESAREAN SECTION N/A 1963 & 1965   COLONOSCOPY WITH PROPOFOL N/A 02/04/2018   Procedure: COLONOSCOPY WITH PROPOFOL;  Surgeon: Manya Silvas, MD;  Location: [facility];  Service: Endoscopy;  Laterality: N/A;   ESOPHAGOGASTRODUODENOSCOPY (EGD) WITH PROPOFOL N/A 02/04/2018   Procedure: ESOPHAGOGASTRODUODENOSCOPY (EGD) WITH PROPOFOL;  Surgeon: Manya Silvas, MD;  Location: [facility] ENDOSCOPY;  Service: Endoscopy;  Laterality: N/A;   FOOT SURGERY Right 2007   HARDWARE REMOVAL Right 01/14/2015  Procedure: HARDWARE REMOVAL/ REMOVAL OF 3 CANNULATED SCREWS FROM RIGHT HIP.;  Surgeon: Dereck Leep, MD;  Location: [facility] ORS;  Service: Orthopedics;  Laterality: Right;   HIP FRACTURE SURGERY Right 2016   JOINT REPLACEMENT Bilateral 2014    knees   NECK SURGERY     fusion   TOTAL HIP ARTHROPLASTY Right 01/14/2015   Procedure: TOTAL HIP ARTHROPLASTY;  Surgeon: Dereck Leep, MD;  Location: [facility] ORS;  Service: Orthopedics;  Laterality: Right;    FAMILY HISTORY: Family History  Problem Relation Age of Onset   Cancer Father 62       Colon Cancer   Breast cancer Father 44   Breast cancer Sister    Stroke Mother    Heart disease Maternal Grandmother     ADVANCED DIRECTIVES (Y/N):  N  HEALTH MAINTENANCE: Social History   Tobacco Use   Smoking status: Never Smoker   Smokeless tobacco: Never Used  Substance Use Topics   Alcohol use: No   Drug use: No     Colonoscopy:  PAP:  Bone density:  Lipid panel:  Allergies  Allergen Reactions   Ambien [Zolpidem Tartrate] Other (See Comments)    Altered mental status   Barley Grass Other (See Comments)    Celiac disease   Wheat Bran Rash    Celiac disease   Tape Other (See Comments)    Adhesive tape - tears skin   Codeine Nausea Only   Ibuprofen Other (See Comments) and Rash    GI upset " makes my stomach hurt"   Simvastatin Rash   Valium [Diazepam] Other (See Comments)    Sleep walks    Current Outpatient Medications  Medication Sig Dispense Refill   acetaminophen (TYLENOL) 325 MG tablet Take 2 tablets (650 mg total) by mouth every 6 (six) hours as needed for mild pain (or Fever >/= 101).     Biotin 10000 MCG TABS Take 20,000 mcg by mouth daily.      fexofenadine (ALLEGRA) 180 MG tablet Take 180 mg by mouth daily.     fluticasone (FLONASE) 50 MCG/ACT nasal spray Place 2 sprays into both nostrils daily.      furosemide (LASIX) 20 MG tablet Take 1 tablet by mouth 1 day or 1 dose.     Glucosamine-Chondroitin 750-600 MG TABS Take 2 tablets by mouth daily.      levothyroxine (SYNTHROID, LEVOTHROID) 50 MCG tablet Take 50 mcg by mouth daily before breakfast.      Magnesium 500 MG CAPS Take 500 mg by mouth daily.      metFORMIN  (GLUCOPHAGE-XR) 500 MG 24 hr tablet Take 500 mg by mouth daily with breakfast.      potassium chloride (K-DUR) 10 MEQ tablet Take 1 tablet by mouth 1 day or 1 dose.     pramipexole (MIRAPEX) 1 MG tablet Take 1 mg by mouth 2 (two) times daily.      No current facility-administered medications for this visit.     OBJECTIVE: There were no vitals filed for this visit.   There is no height or weight on file to calculate BMI.    ECOG FS:0 - Asymptomatic  LAB RESULTS:  Lab Results  Component Value Date   NA 131 (L) 07/19/2018   K 4.4 07/19/2018   CL 98 07/19/2018   CO2 27 07/19/2018   GLUCOSE 140 (H) 07/19/2018   BUN 9 07/19/2018   CREATININE 0.53 07/19/2018   CALCIUM 8.4 (L) 07/19/2018   PROT 7.6 12/10/2016  ALBUMIN 3.6 12/10/2016   AST 25 12/10/2016   ALT 16 12/10/2016   ALKPHOS 83 12/10/2016   BILITOT 0.4 12/10/2016   GFRNONAA >60 07/19/2018   GFRAA >60 07/19/2018    Lab Results  Component Value Date   WBC 7.3 10/28/2018   NEUTROABS 4.7 10/28/2018   HGB 11.3 (L) 10/28/2018   HCT 35.6 (L) 10/28/2018   MCV 83.8 10/28/2018   PLT 365 10/28/2018   Lab Results  Component Value Date   IRON 30 10/28/2018   TIBC 331 10/28/2018   IRONPCTSAT 9 (L) 10/28/2018   Lab Results  Component Value Date   FERRITIN 27 10/28/2018     STUDIES: US Breast Ltd Uni Right Inc Axilla  Result Date: 01/30/2019 CLINICAL DATA:  Delayed follow-up. Patient had probable posttraumatic changes of the right breast (after spinal surgery) seen on ultrasound dated 07/20/2018. Short-term interval follow-up ultrasound was recommended. The patient did not return for the follow-up study. EXAM: DIGITAL DIAGNOSTIC BILATERAL MAMMOGRAM WITH TOMO ULTRASOUND RIGHT BREAST COMPARISON:  Previous exam(s). ACR Breast Density Category c: The breast tissue is heterogeneously dense, which may obscure small masses. FINDINGS: No suspicious mass, malignant type microcalcifications or distortion detected in either breast.  Targeted ultrasound is performed, showing normal tissue in the 3 o'clock region of the right breast 6 cm from the nipple. The previously seen hyperechogenicity consistent with posttraumatic changes no longer visualized. IMPRESSION: No evidence of malignancy in either breast. RECOMMENDATION: Bilateral screening mammogram in 1 year is recommended. I have discussed the findings and recommendations with the patient. Results were also provided in writing at the conclusion of the visit. If applicable, a reminder letter will be sent to the patient regarding the next appointment. BI-RADS CATEGORY  1: Negative. Electronically Signed   By: Lillia Mountain M.D.   On: 01/30/2019 11:36   Mm Diag Breast Tomo Bilateral  Result Date: 01/30/2019 CLINICAL DATA:  Delayed follow-up. Patient had probable posttraumatic changes of the right breast (after spinal surgery) seen on ultrasound dated 07/20/2018. Short-term interval follow-up ultrasound was recommended. The patient did not return for the follow-up study. EXAM: DIGITAL DIAGNOSTIC BILATERAL MAMMOGRAM WITH TOMO ULTRASOUND RIGHT BREAST COMPARISON:  Previous exam(s). ACR Breast Density Category c: The breast tissue is heterogeneously dense, which may obscure small masses. FINDINGS: No suspicious mass, malignant type microcalcifications or distortion detected in either breast. Targeted ultrasound is performed, showing normal tissue in the 3 o'clock region of the right breast 6 cm from the nipple. The previously seen hyperechogenicity consistent with posttraumatic changes no longer visualized. IMPRESSION: No evidence of malignancy in either breast. RECOMMENDATION: Bilateral screening mammogram in 1 year is recommended. I have discussed the findings and recommendations with the patient. Results were also provided in writing at the conclusion of the visit. If applicable, a reminder letter will be sent to the patient regarding the next appointment. BI-RADS CATEGORY  1: Negative.  Electronically Signed   By: Lillia Mountain M.D.   On: 01/30/2019 11:36    ASSESSMENT: Iron deficiency anemia  PLAN:    1. Iron deficiency anemia: Patient's hemoglobin is decreased, but significantly improved since her discharge in January. Her iron stores are also decreased.  Previously, the remainder of her laboratory work was also either negative or within normal limits.  She last received IV Venofer in January 2019. Given th COVID-19 pandemic, will not pursue IV Venofer at this time.  Patient will return to clinic in 3 months for further evaluation and Venofer infusion.  2. Celiac disease:  Continue monitoring and evaluation per GI.  Patient patient states because of this she has difficulty absorbing oral iron supplementation. 3. Back surgery: Continue follow-up with orthopedics as scheduled.   I provided 25 minutes of non face-to-face telephone visit time during this encounter, and > 50% was spent counseling as documented under my assessment & plan.  Patient expressed understanding and was in agreement with this plan. She also understands that She can call clinic at any time with any questions, concerns, or complaints.    Lloyd Huger, MD   02/03/2019 4:24 PM

## 2019-02-10 ENCOUNTER — Inpatient Hospital Stay: Payer: Medicare Other | Admitting: Oncology

## 2019-02-10 ENCOUNTER — Inpatient Hospital Stay: Payer: Medicare Other

## 2019-05-06 NOTE — Progress Notes (Deleted)
[place]  [phone]) [phone] Fax:(336) [phone]  ID: Letizia Hook OB: Dec 16, 1945  MR#: [id number]  WNI#:[id number]  Patient Care Team: Derinda Late, MD as PCP - General (Family Medicine) Marry Guan, Laurice Record, MD (Orthopedic Surgery)  I connected with Leonia Reeves on 05/06/19 at  2:00 PM EST by telephone visit and verified that I am speaking with the correct person using two identifiers.   I discussed the limitations, risks, security and privacy concerns of performing an evaluation and management service by telemedicine and the availability of in-person appointments. I also discussed with the patient that there may be a patient responsible charge related to this service. The patient expressed understanding and agreed to proceed.   Other persons participating in the visit and their role in the encounter: Patient, MD   Patient's location: Home  Provider's location: Clinic   CHIEF COMPLAINT: Iron deficiency anemia  INTERVAL HISTORY: Patient agreed to evaluation and discussion of her laboratory work via telephone today. She had a major back operation in January which required post-op blood transfusion.  She still has fatigue, but this is much improved. Her pain has resolved.  She has no neurologic complaints.  She denies any recent fevers or illnesses.  She has a good appetite and denies weight loss. She has no chest pain, shortness of breath, cough, or hemoptysis. She denies any nausea, vomiting, constipation, or diarrhea.  She denies any melena or hematochezia.  She has no urinary complaints. Patient offers no further specific complaints today.  REVIEW OF SYSTEMS:   Review of Systems  Constitutional: Positive for malaise/fatigue. Negative for fever and weight loss.  Respiratory: Negative.  Negative for cough, hemoptysis and shortness of breath.   Cardiovascular: Negative.  Negative for chest pain and leg swelling.  Gastrointestinal: Negative.  Negative for  abdominal pain, blood in stool and melena.  Genitourinary: Negative.  Negative for hematuria.  Musculoskeletal: Negative.  Negative for back pain and myalgias.  Skin: Negative.  Negative for rash.  Neurological: Negative.  Negative for sensory change, focal weakness, weakness and headaches.  Psychiatric/Behavioral: Negative.  The patient is not nervous/anxious.     As per HPI. Otherwise, a complete review of systems is negative.  PAST MEDICAL HISTORY: Past Medical History:  Diagnosis Date  . Anemia   . Arthritis   . Back pain   . Celiac disease   . Depression   . Diabetes mellitus without complication ([place])   . GERD (gastroesophageal reflux disease)   . Heart murmur   . MVP (mitral valve prolapse)   . RLS (restless legs syndrome)     PAST SURGICAL HISTORY: Past Surgical History:  Procedure Laterality Date  . ABDOMINAL HYSTERECTOMY    . APPLICATION OF INTRAOPERATIVE CT SCAN N/A 07/06/2018   Procedure: APPLICATION OF INTRAOPERATIVE CT SCAN;  Surgeon: Newman Pies, MD;  Location: [place];  Service: Neurosurgery;  Laterality: N/A;  . BACK SURGERY     spinal fusion  . CATARACT EXTRACTION W/ INTRAOCULAR LENS  IMPLANT, BILATERAL Bilateral   . [place]  . COLONOSCOPY WITH PROPOFOL N/A 02/04/2018   Procedure: COLONOSCOPY WITH PROPOFOL;  Surgeon: Manya Silvas, MD;  Location: [facility] ENDOSCOPY;  Service: Endoscopy;  Laterality: N/A;  . ESOPHAGOGASTRODUODENOSCOPY (EGD) WITH PROPOFOL N/A 02/04/2018   Procedure: ESOPHAGOGASTRODUODENOSCOPY (EGD) WITH PROPOFOL;  Surgeon: Manya Silvas, MD;  Location: [facility] ENDOSCOPY;  Service: Endoscopy;  Laterality: N/A;  . FOOT SURGERY Right 2007  . HARDWARE REMOVAL Right 01/14/2015  Procedure: HARDWARE REMOVAL/ REMOVAL OF 3 CANNULATED SCREWS FROM RIGHT HIP.;  Surgeon: Dereck Leep, MD;  Location: [facility] ORS;  Service: Orthopedics;  Laterality: Right;  . HIP FRACTURE SURGERY Right 2016  . JOINT REPLACEMENT Bilateral 2014    knees  . NECK SURGERY     fusion  . TOTAL HIP ARTHROPLASTY Right 01/14/2015   Procedure: TOTAL HIP ARTHROPLASTY;  Surgeon: Dereck Leep, MD;  Location: [facility] ORS;  Service: Orthopedics;  Laterality: Right;    FAMILY HISTORY: Family History  Problem Relation Age of Onset  . Cancer Father 19       Colon Cancer  . Breast cancer Father 11  . Breast cancer Sister   . Stroke Mother   . Heart disease Maternal Grandmother     ADVANCED DIRECTIVES (Y/N):  N  HEALTH MAINTENANCE: Social History   Tobacco Use  . Smoking status: Never Smoker  . Smokeless tobacco: Never Used  Substance Use Topics  . Alcohol use: No  . Drug use: No     Colonoscopy:  PAP:  Bone density:  Lipid panel:  Allergies  Allergen Reactions  . Ambien [Zolpidem Tartrate] Other (See Comments)    Altered mental status  . Barley Grass Other (See Comments)    Celiac disease  . Wheat Bran Rash    Celiac disease  . Tape Other (See Comments)    Adhesive tape - tears skin  . Codeine Nausea Only  . Ibuprofen Other (See Comments) and Rash    GI upset " makes my stomach hurt"  . Simvastatin Rash  . Valium [Diazepam] Other (See Comments)    Sleep walks    Current Outpatient Medications  Medication Sig Dispense Refill  . acetaminophen (TYLENOL) 325 MG tablet Take 2 tablets (650 mg total) by mouth every 6 (six) hours as needed for mild pain (or Fever >/= 101).    . Biotin 10000 MCG TABS Take 20,000 mcg by mouth daily.     . fexofenadine (ALLEGRA) 180 MG tablet Take 180 mg by mouth daily.    . fluticasone (FLONASE) 50 MCG/ACT nasal spray Place 2 sprays into both nostrils daily.     . furosemide (LASIX) 20 MG tablet Take 1 tablet by mouth 1 day or 1 dose.    . Glucosamine-Chondroitin 750-600 MG TABS Take 2 tablets by mouth daily.     Marland Kitchen levothyroxine (SYNTHROID, LEVOTHROID) 50 MCG tablet Take 50 mcg by mouth daily before breakfast.     . Magnesium 500 MG CAPS Take 500 mg by mouth daily.     . metFORMIN  (GLUCOPHAGE-XR) 500 MG 24 hr tablet Take 500 mg by mouth daily with breakfast.     . potassium chloride (K-DUR) 10 MEQ tablet Take 1 tablet by mouth 1 day or 1 dose.    . pramipexole (MIRAPEX) 1 MG tablet Take 1 mg by mouth 2 (two) times daily.      No current facility-administered medications for this visit.     OBJECTIVE: There were no vitals filed for this visit.   There is no height or weight on file to calculate BMI.    ECOG FS:0 - Asymptomatic  LAB RESULTS:  Lab Results  Component Value Date   NA 131 (L) 07/19/2018   K 4.4 07/19/2018   CL 98 07/19/2018   CO2 27 07/19/2018   GLUCOSE 140 (H) 07/19/2018   BUN 9 07/19/2018   CREATININE 0.53 07/19/2018   CALCIUM 8.4 (L) 07/19/2018   PROT 7.6 12/10/2016  ALBUMIN 3.6 12/10/2016   AST 25 12/10/2016   ALT 16 12/10/2016   ALKPHOS 83 12/10/2016   BILITOT 0.4 12/10/2016   GFRNONAA >60 07/19/2018   GFRAA >60 07/19/2018    Lab Results  Component Value Date   WBC 7.3 10/28/2018   NEUTROABS 4.7 10/28/2018   HGB 11.3 (L) 10/28/2018   HCT 35.6 (L) 10/28/2018   MCV 83.8 10/28/2018   PLT 365 10/28/2018   Lab Results  Component Value Date   IRON 30 10/28/2018   TIBC 331 10/28/2018   IRONPCTSAT 9 (L) 10/28/2018   Lab Results  Component Value Date   FERRITIN 27 10/28/2018     STUDIES: No results found.  ASSESSMENT: Iron deficiency anemia  PLAN:    1. Iron deficiency anemia: Patient's hemoglobin is decreased, but significantly improved since her discharge in January. Her iron stores are also decreased.  Previously, the remainder of her laboratory work was also either negative or within normal limits.  She last received IV Venofer in January 2019. Given th COVID-19 pandemic, will not pursue IV Venofer at this time.  Patient will return to clinic in 3 months for further evaluation and Venofer infusion.  2. Celiac disease: Continue monitoring and evaluation per GI.  Patient patient states because of this she has difficulty  absorbing oral iron supplementation. 3. Back surgery: Continue follow-up with orthopedics as scheduled.   I provided 25 minutes of non face-to-face telephone visit time during this encounter, and > 50% was spent counseling as documented under my assessment & plan.  Patient expressed understanding and was in agreement with this plan. She also understands that She can call clinic at any time with any questions, concerns, or complaints.    Lloyd Huger, MD   05/06/2019 7:41 AM

## 2019-05-12 ENCOUNTER — Inpatient Hospital Stay: Payer: Medicare Other

## 2019-05-12 ENCOUNTER — Inpatient Hospital Stay: Payer: Medicare Other | Admitting: Oncology

## 2020-01-25 ENCOUNTER — Other Ambulatory Visit: Payer: Self-pay | Admitting: Neurosurgery

## 2020-02-14 ENCOUNTER — Other Ambulatory Visit: Payer: Self-pay | Admitting: Neurosurgery

## 2020-02-23 NOTE — Pre-Procedure Instructions (Signed)
Your procedure is scheduled on Thursday, September 9th.  Report to [facility] Main Entrance "A" at 9:45 A.M., and check in at the Admitting office.  Call this number if you have problems the morning of surgery:  [phone]  Call [phone] if you have any questions prior to your surgery date Monday-Friday 8am-4pm    Remember:  Do not eat or drink after midnight the night before your surgery      Take these medicines the morning of surgery with A SIP OF WATER  levothyroxine (SYNTHROID) pramipexole (MIRAPEX)  acetaminophen (TYLENOL)-as needed fluticasone (FLONASE)-as needed Eye drops-as needed  As of today, STOP taking any Aspirin (unless otherwise instructed by your surgeon) Aleve, Naproxen, Ibuprofen, Motrin, Advil, Goody's, BC's, all herbal medications, fish oil, and all vitamins.   WHAT DO I DO ABOUT MY DIABETES MEDICATION?   Marland Kitchen Do not take metFORMIN (GLUCOPHAGE-XR) the morning of surgery.   HOW TO MANAGE YOUR DIABETES BEFORE AND AFTER SURGERY  Why is it important to control my blood sugar before and after surgery? . Improving blood sugar levels before and after surgery helps healing and can limit problems. . A way of improving blood sugar control is eating a healthy diet by: o  Eating less sugar and carbohydrates o  Increasing activity/exercise o  Talking with your doctor about reaching your blood sugar goals . High blood sugars (greater than 180 mg/dL) can raise your risk of infections and slow your recovery, so you will need to focus on controlling your diabetes during the weeks before surgery. . Make sure that the doctor who takes care of your diabetes knows about your planned surgery including the date and location.  How do I manage my blood sugar before surgery? . Check your blood sugar at least 4 times a day, starting 2 days before surgery, to make sure that the level is not too high or low. . Check your blood sugar the morning of your surgery when you wake up  and every 2 hours until you get to the Short Stay unit. o If your blood sugar is less than 70 mg/dL, you will need to treat for low blood sugar: - Do not take insulin. - Treat a low blood sugar (less than 70 mg/dL) with  cup of clear juice (cranberry or apple), 4 glucose tablets, OR glucose gel. - Recheck blood sugar in 15 minutes after treatment (to make sure it is greater than 70 mg/dL). If your blood sugar is not greater than 70 mg/dL on recheck, call [phone] for further instructions. . Report your blood sugar to the short stay nurse when you get to Short Stay.  . If you are admitted to the hospital after surgery: o Your blood sugar will be checked by the staff and you will probably be given insulin after surgery (instead of oral diabetes medicines) to make sure you have good blood sugar levels. o The goal for blood sugar control after surgery is 80-180 mg/dL.                    Do not wear jewelry, make up, or nail polish            Do not wear lotions, powders, perfumes/colognes, or deodorant.            Do not shave 48 hours prior to surgery.  Men may shave face and neck.            Do not bring valuables to the hospital.  [facility] is not responsible for any belongings or valuables.  Do NOT Smoke (Tobacco/Vaping) or drink Alcohol 24 hours prior to your procedure If you use a CPAP at night, you may bring all equipment for your overnight stay.   Contacts, glasses, dentures or bridgework may not be worn into surgery.      For patients admitted to the hospital, discharge time will be determined by your treatment team.   Patients discharged the day of surgery will not be allowed to drive home, and someone needs to stay with them for 24 hours.    Special instructions:   [facility]- Preparing For Surgery  Before surgery, you can play an important role. Because skin is not sterile, your skin needs to be as free of germs as possible. You can reduce the number of germs  on your skin by washing with CHG (chlorahexidine gluconate) Soap before surgery.  CHG is an antiseptic cleaner which kills germs and bonds with the skin to continue killing germs even after washing.    Oral Hygiene is also important to reduce your risk of infection.  Remember - BRUSH YOUR TEETH THE MORNING OF SURGERY WITH YOUR REGULAR TOOTHPASTE  Please do not use if you have an allergy to CHG or antibacterial soaps. If your skin becomes reddened/irritated stop using the CHG.  Do not shave (including legs and underarms) for at least 48 hours prior to first CHG shower. It is OK to shave your face.  Please follow these instructions carefully.   1. Shower the NIGHT BEFORE SURGERY and the MORNING OF SURGERY with CHG Soap.   2. If you chose to wash your hair, wash your hair first as usual with your normal shampoo.  3. After you shampoo, rinse your hair and body thoroughly to remove the shampoo.  4. Use CHG as you would any other liquid soap. You can apply CHG directly to the skin and wash gently with a scrungie or a clean washcloth.   5. Apply the CHG Soap to your body ONLY FROM THE NECK DOWN.  Do not use on open wounds or open sores. Avoid contact with your eyes, ears, mouth and genitals (private parts). Wash Face and genitals (private parts)  with your normal soap.   6. Wash thoroughly, paying special attention to the area where your surgery will be performed.  7. Thoroughly rinse your body with warm water from the neck down.  8. DO NOT shower/wash with your normal soap after using and rinsing off the CHG Soap.  9. Pat yourself dry with a CLEAN TOWEL.  10. Wear CLEAN PAJAMAS to bed the night before surgery  11. Place CLEAN SHEETS on your bed the night of your first shower and DO NOT SLEEP WITH PETS.   Day of Surgery: Wear Clean/Comfortable clothing the morning of surgery Do not apply any deodorants/lotions.   Remember to brush your teeth WITH YOUR REGULAR TOOTHPASTE.   Please read  over the following fact sheets that you were given.

## 2020-02-27 ENCOUNTER — Other Ambulatory Visit (HOSPITAL_COMMUNITY)
Admission: RE | Admit: 2020-02-27 | Discharge: 2020-02-27 | Disposition: A | Discharge status: Home / Self Care | Payer: Medicare Other | Source: Ambulatory Visit | Attending: Neurosurgery | Admitting: Neurosurgery

## 2020-02-27 ENCOUNTER — Encounter (HOSPITAL_COMMUNITY): Payer: Self-pay

## 2020-02-27 ENCOUNTER — Other Ambulatory Visit: Payer: Self-pay

## 2020-02-27 ENCOUNTER — Encounter (HOSPITAL_COMMUNITY)
Admission: RE | Admit: 2020-02-27 | Discharge: 2020-02-27 | Disposition: A | Discharge status: Home / Self Care | Payer: Medicare Other | Source: Ambulatory Visit | Attending: Neurosurgery | Admitting: Neurosurgery

## 2020-02-27 DIAGNOSIS — K900 Celiac disease: Secondary | ICD-10-CM | POA: Diagnosis not present

## 2020-02-27 DIAGNOSIS — Z01818 Encounter for other preprocedural examination: Secondary | ICD-10-CM | POA: Insufficient documentation

## 2020-02-27 DIAGNOSIS — E039 Hypothyroidism, unspecified: Secondary | ICD-10-CM | POA: Diagnosis not present

## 2020-02-27 DIAGNOSIS — F329 Major depressive disorder, single episode, unspecified: Secondary | ICD-10-CM | POA: Insufficient documentation

## 2020-02-27 DIAGNOSIS — M4312 Spondylolisthesis, cervical region: Secondary | ICD-10-CM | POA: Insufficient documentation

## 2020-02-27 DIAGNOSIS — Z20822 Contact with and (suspected) exposure to covid-19: Secondary | ICD-10-CM | POA: Diagnosis not present

## 2020-02-27 DIAGNOSIS — E119 Type 2 diabetes mellitus without complications: Secondary | ICD-10-CM | POA: Diagnosis not present

## 2020-02-27 DIAGNOSIS — Z7984 Long term (current) use of oral hypoglycemic drugs: Secondary | ICD-10-CM | POA: Insufficient documentation

## 2020-02-27 DIAGNOSIS — M1990 Unspecified osteoarthritis, unspecified site: Secondary | ICD-10-CM | POA: Diagnosis not present

## 2020-02-27 DIAGNOSIS — I341 Nonrheumatic mitral (valve) prolapse: Secondary | ICD-10-CM | POA: Insufficient documentation

## 2020-02-27 DIAGNOSIS — Z01812 Encounter for preprocedural laboratory examination: Secondary | ICD-10-CM | POA: Insufficient documentation

## 2020-02-27 DIAGNOSIS — Z79899 Other long term (current) drug therapy: Secondary | ICD-10-CM | POA: Diagnosis not present

## 2020-02-27 HISTORY — DX: Hypothyroidism, unspecified: E03.9

## 2020-02-27 HISTORY — DX: Pneumonia, unspecified organism: J18.9

## 2020-02-27 LAB — HEMOGLOBIN A1C
Hgb A1c MFr Bld: 7.1 % — ABNORMAL HIGH (ref 4.8–5.6)
Mean Plasma Glucose: 157.07 mg/dL

## 2020-02-27 LAB — CBC
HCT: 40.2 % (ref 36.0–46.0)
Hemoglobin: 13 g/dL (ref 12.0–15.0)
MCH: 30 pg (ref 26.0–34.0)
MCHC: 32.3 g/dL (ref 30.0–36.0)
MCV: 92.8 fL (ref 80.0–100.0)
Platelets: 294 10*3/uL (ref 150–400)
RBC: 4.33 MIL/uL (ref 3.87–5.11)
RDW: 12.6 % (ref 11.5–15.5)
WBC: 6 10*3/uL (ref 4.0–10.5)
nRBC: 0 % (ref 0.0–0.2)

## 2020-02-27 LAB — BASIC METABOLIC PANEL
Anion gap: 9 (ref 5–15)
BUN: 11 mg/dL (ref 8–23)
CO2: 27 mmol/L (ref 22–32)
Calcium: 9.1 mg/dL (ref 8.9–10.3)
Chloride: 98 mmol/L (ref 98–111)
Creatinine, Ser: 0.79 mg/dL (ref 0.44–1.00)
GFR calc Af Amer: >60 mL/min (ref >60)
GFR calc non Af Amer: >60 mL/min (ref >60)
Glucose, Bld: 228 mg/dL — ABNORMAL HIGH (ref 70–99)
Potassium: 3.6 mmol/L (ref 3.5–5.1)
Sodium: 134 mmol/L — ABNORMAL LOW (ref 135–145)

## 2020-02-27 LAB — TYPE AND SCREEN
ABO/RH(D): A NEG
Antibody Screen: NEGATIVE

## 2020-02-27 LAB — SURGICAL PCR SCREEN
MRSA, PCR: NEGATIVE
Staphylococcus aureus: NEGATIVE

## 2020-02-27 LAB — GLUCOSE, CAPILLARY: Glucose-Capillary: 251 mg/dL — ABNORMAL HIGH (ref 70–99)

## 2020-02-27 NOTE — Progress Notes (Signed)
PCP - Dr. Derinda Late Cardiologist - Dr. Ida Rogue Oncologist: Delight Hoh  PPM/ICD - Denies  Chest x-ray - N/A EKG - 02/27/20 Stress Test - 03/04/18 ECHO - Denies Cardiac Cath - Denies  Sleep Study - Denies  Fasting Blood Sugar - 70's Checks Blood Sugar __2___ times a day  Blood Thinner Instructions: N/A Aspirin Instructions: N/A  ERAS Protcol - No  COVID TEST- 02/27/20   Anesthesia review: yes, abnormal EKG  Patient denies shortness of breath, fever, cough and chest pain at PAT appointment   All instructions explained to the patient, with a verbal understanding of the material. Patient agrees to go over the instructions while at home for a better understanding. Patient also instructed to self quarantine after being tested for COVID-19. The opportunity to ask questions was provided.

## 2020-02-28 LAB — SARS CORONAVIRUS 2 (TAT 6-24 HRS): SARS Coronavirus 2: NEGATIVE

## 2020-02-28 NOTE — Progress Notes (Signed)
Anesthesia Chart Review:  Case: [id number] Date/Time: 02/29/20 1134   Procedure: ANTERIOR CERVICAL DECOMPRESSION/DISCECTOMY FUSION, INTERBODY PROSTHESIS, PLATE/SCREWS CERVICAL 4- CERVICAL 5; EXPLORE FUSION;RMOVE OLD PLATE (N/A ) - 3C   Anesthesia type: General   Pre-op diagnosis: SPONDYLOLISTHESIS, CERVICAL REGION   Location: [place] OR ROOM 19 / [place] OR   Surgeons: Newman Pies, MD      DISCUSSION: Patient is a 74 year old female scheduled for the above procedure.  History includes never smoker, DM2, HTN, murmur (MVP), GERD, celiac disease, RLS, anemia, hypothyroidism, C5-7 ACDF 06/03/06, back surgery (L3-5 PLIF 11/22/08, L1-3 & L5-S1 transforaminal fusion and T10-L3 & L5-S1 posterior lateral arthrodesis 07/06/18), right THA (2016), skin cancer (SCC RLE, s/p excision, 2018).   As needed cardiology follow-up recommended after low risk stress test in 2019 (Dr. Rockey Situ). She denied SOB, cough, fever, chest pain at PAT RN visit. EKG appears stable.   02/27/20 presurgical COVID-19 test was negative. Anesthesia team to evaluate on the day of surgery.    VS: BP 138/73   Pulse 93   Temp 36.5 C (Oral)   Resp 18   Ht 5' 8"  (1.727 m)   Wt 87.1 kg   SpO2 97%   BMI 29.19 kg/m    PROVIDERS: Derinda Late, MD is PCP ([facility] Everywhere) - Ida Rogue, MD is cardiologist. Seen on 03/01/17 for evaluation of chest pain. PRN cardiology follow-up anticipated pending stress test result (which was considered low risk). Delight Hoh, MD is hematologist (for iron deficiency anemia). History of Venofer IV in the past. Last evaluation 10/28/18. Gavin Pound, MD is rheumatologist - Keith Rake, MD is GI (2019)   LABS: Labs reviewed: Acceptable for surgery.  (all labs ordered are listed, but only abnormal results are displayed)  Labs Reviewed  GLUCOSE, CAPILLARY - Abnormal; Notable for the following components:      Result Value   Glucose-Capillary 251 (*)    All other components within  normal limits  HEMOGLOBIN A1C - Abnormal; Notable for the following components:   Hgb A1c MFr Bld 7.1 (*)    All other components within normal limits  BASIC METABOLIC PANEL - Abnormal; Notable for the following components:   Sodium 134 (*)    Glucose, Bld 228 (*)    All other components within normal limits  SURGICAL PCR SCREEN  CBC  TYPE AND SCREEN    EKG: 02/27/20:  Sinus rhythm with 1st degree A-V block Left anterior fasicular block Since last tracing Premature ventricular complexes NO LONGER PRESENT Confirmed by Skeet Latch [id number]) on 02/27/2020 10:31:19 PM   CV: Nuclear stress test 03/04/18: Pharmacological myocardial perfusion imaging study with no significant ischemia Small region of mild fixed perfusion defect in the distal inferolateral and apical inferolateral wall, unable to exclude attenuation artifact Minimal defect noted on non-attenuation corrected images Normal wall motion, EF estimated at 68% No EKG changes concerning for ischemia at peak stress or in recovery. Low risk scan (Dr. Rockey Situ did not recommend any further heart testing at that time. Consider gallbladder disease as cause of symptoms.)   Past Medical History:  Diagnosis Date  . Anemia   . Arthritis   . Back pain   . Celiac disease   . Depression   . Diabetes mellitus without complication ([place])   . GERD (gastroesophageal reflux disease)   . Heart murmur   . Hypothyroidism   . MVP (mitral valve prolapse)   . Pneumonia   . RLS (restless legs syndrome)  Past Surgical History:  Procedure Laterality Date  . ABDOMINAL HYSTERECTOMY    . APPLICATION OF INTRAOPERATIVE CT SCAN N/A 07/06/2018   Procedure: APPLICATION OF INTRAOPERATIVE CT SCAN;  Surgeon: Newman Pies, MD;  Location: [place];  Service: Neurosurgery;  Laterality: N/A;  . BACK SURGERY     spinal fusion  . CATARACT EXTRACTION W/ INTRAOCULAR LENS  IMPLANT, BILATERAL Bilateral   . [place]  . COLONOSCOPY  WITH PROPOFOL N/A 02/04/2018   Procedure: COLONOSCOPY WITH PROPOFOL;  Surgeon: Manya Silvas, MD;  Location: [facility] ENDOSCOPY;  Service: Endoscopy;  Laterality: N/A;  . ESOPHAGOGASTRODUODENOSCOPY (EGD) WITH PROPOFOL N/A 02/04/2018   Procedure: ESOPHAGOGASTRODUODENOSCOPY (EGD) WITH PROPOFOL;  Surgeon: Manya Silvas, MD;  Location: [facility] ENDOSCOPY;  Service: Endoscopy;  Laterality: N/A;  . EYE SURGERY    . FOOT SURGERY Right 2007  . FRACTURE SURGERY    . HARDWARE REMOVAL Right 01/14/2015   Procedure: HARDWARE REMOVAL/ REMOVAL OF 3 CANNULATED SCREWS FROM RIGHT HIP.;  Surgeon: Dereck Leep, MD;  Location: [facility] ORS;  Service: Orthopedics;  Laterality: Right;  . HIP FRACTURE SURGERY Right 2016  . JOINT REPLACEMENT Bilateral 2014   knees  . NECK SURGERY     fusion  . TOTAL HIP ARTHROPLASTY Right 01/14/2015   Procedure: TOTAL HIP ARTHROPLASTY;  Surgeon: Dereck Leep, MD;  Location: [facility] ORS;  Service: Orthopedics;  Laterality: Right;    MEDICATIONS: . acetaminophen (TYLENOL) 650 MG CR tablet  . amoxicillin (AMOXIL) 500 MG capsule  . Biotin 5000 MCG CAPS  . citalopram (CELEXA) 20 MG tablet  . fexofenadine (ALLEGRA) 180 MG tablet  . fluticasone (FLONASE) 50 MCG/ACT nasal spray  . furosemide (LASIX) 20 MG tablet  . Glucosamine Sulfate 1000 MG CAPS  . levothyroxine (SYNTHROID) 75 MCG tablet  . Magnesium Oxide (MAG-200) 200 MG TABS  . metFORMIN (GLUCOPHAGE-XR) 500 MG 24 hr tablet  . Polyethyl Glycol-Propyl Glycol (LUBRICANT EYE DROPS) 0.4-0.3 % SOLN  . pramipexole (MIRAPEX) 1 MG tablet   No current facility-administered medications for this encounter.     Myra Gianotti, PA-C Surgical Short Stay/Anesthesiology [facility] Phone [phone] [facility] Phone [phone] 02/28/2020 10:10 AM

## 2020-02-28 NOTE — Anesthesia Preprocedure Evaluation (Addendum)
Anesthesia Evaluation  Patient identified by MRN, date of birth, ID band Patient awake    Reviewed: Allergy & Precautions, NPO status , Patient's Chart, lab work & pertinent test results  Airway Mallampati: III  TM Distance: >3 FB Neck ROM: Full    Dental  (+) Chipped, Poor Dentition,    Pulmonary neg pulmonary ROS,    Pulmonary exam normal breath sounds clear to auscultation       Cardiovascular Normal cardiovascular exam+ Valvular Problems/Murmurs MVP  Rhythm:Regular Rate:Normal  ECG: SR, rate 85   Neuro/Psych PSYCHIATRIC DISORDERS Depression    GI/Hepatic Neg liver ROS, GERD  Controlled,  Endo/Other  diabetes, Oral Hypoglycemic AgentsHypothyroidism   Renal/GU negative Renal ROS     Musculoskeletal  (+) Arthritis , Back pain   Abdominal   Peds  Hematology negative hematology ROS (+) anemia ,   Anesthesia Other Findings SPONDYLOLISTHESIS, CERVICAL REGION  Reproductive/Obstetrics                            Anesthesia Physical Anesthesia Plan  ASA: III  Anesthesia Plan: General   Post-op Pain Management:    Induction: Intravenous  PONV Risk Score and Plan: 3 and Ondansetron, Dexamethasone, Treatment may vary due to age or medical condition and Propofol infusion  Airway Management Planned: Oral ETT and Video Laryngoscope Planned  Additional Equipment:   Intra-op Plan:   Post-operative Plan: Extubation in OR  Informed Consent: I have reviewed the patients History and Physical, chart, labs and discussed the procedure including the risks, benefits and alternatives for the proposed anesthesia with the patient or authorized representative who has indicated his/her understanding and acceptance.     Dental advisory given  Plan Discussed with: CRNA  Anesthesia Plan Comments:        Anesthesia Quick Evaluation

## 2020-02-29 ENCOUNTER — Encounter (HOSPITAL_COMMUNITY)
Admission: RE | Disposition: A | Discharge status: Home / Self Care | Payer: Self-pay | Source: Home / Self Care | Attending: Neurosurgery

## 2020-02-29 ENCOUNTER — Ambulatory Visit (HOSPITAL_COMMUNITY): Payer: Medicare Other | Admitting: Vascular Surgery

## 2020-02-29 ENCOUNTER — Ambulatory Visit (HOSPITAL_COMMUNITY)
Admission: RE | Admit: 2020-02-29 | Discharge: 2020-03-01 | Disposition: A | Discharge status: Home / Self Care | Payer: Medicare Other | Attending: Neurosurgery | Admitting: Neurosurgery

## 2020-02-29 ENCOUNTER — Other Ambulatory Visit: Payer: Self-pay

## 2020-02-29 ENCOUNTER — Ambulatory Visit (HOSPITAL_COMMUNITY): Payer: Medicare Other

## 2020-02-29 ENCOUNTER — Encounter (HOSPITAL_COMMUNITY): Payer: Self-pay | Admitting: Neurosurgery

## 2020-02-29 ENCOUNTER — Ambulatory Visit (HOSPITAL_COMMUNITY): Payer: Medicare Other | Admitting: Anesthesiology

## 2020-02-29 DIAGNOSIS — E119 Type 2 diabetes mellitus without complications: Secondary | ICD-10-CM | POA: Diagnosis not present

## 2020-02-29 DIAGNOSIS — G2581 Restless legs syndrome: Secondary | ICD-10-CM | POA: Insufficient documentation

## 2020-02-29 DIAGNOSIS — Z888 Allergy status to other drugs, medicaments and biological substances status: Secondary | ICD-10-CM | POA: Diagnosis not present

## 2020-02-29 DIAGNOSIS — Z7984 Long term (current) use of oral hypoglycemic drugs: Secondary | ICD-10-CM | POA: Insufficient documentation

## 2020-02-29 DIAGNOSIS — I341 Nonrheumatic mitral (valve) prolapse: Secondary | ICD-10-CM | POA: Diagnosis not present

## 2020-02-29 DIAGNOSIS — Z981 Arthrodesis status: Secondary | ICD-10-CM | POA: Diagnosis not present

## 2020-02-29 DIAGNOSIS — K219 Gastro-esophageal reflux disease without esophagitis: Secondary | ICD-10-CM | POA: Diagnosis not present

## 2020-02-29 DIAGNOSIS — Z885 Allergy status to narcotic agent status: Secondary | ICD-10-CM | POA: Insufficient documentation

## 2020-02-29 DIAGNOSIS — Z96641 Presence of right artificial hip joint: Secondary | ICD-10-CM | POA: Insufficient documentation

## 2020-02-29 DIAGNOSIS — Z79890 Hormone replacement therapy (postmenopausal): Secondary | ICD-10-CM | POA: Insufficient documentation

## 2020-02-29 DIAGNOSIS — Z886 Allergy status to analgesic agent status: Secondary | ICD-10-CM | POA: Insufficient documentation

## 2020-02-29 DIAGNOSIS — E039 Hypothyroidism, unspecified: Secondary | ICD-10-CM | POA: Diagnosis not present

## 2020-02-29 DIAGNOSIS — F329 Major depressive disorder, single episode, unspecified: Secondary | ICD-10-CM | POA: Diagnosis not present

## 2020-02-29 DIAGNOSIS — Z79899 Other long term (current) drug therapy: Secondary | ICD-10-CM | POA: Diagnosis not present

## 2020-02-29 DIAGNOSIS — Z96653 Presence of artificial knee joint, bilateral: Secondary | ICD-10-CM | POA: Insufficient documentation

## 2020-02-29 DIAGNOSIS — M4312 Spondylolisthesis, cervical region: Secondary | ICD-10-CM | POA: Diagnosis present

## 2020-02-29 DIAGNOSIS — M1990 Unspecified osteoarthritis, unspecified site: Secondary | ICD-10-CM | POA: Insufficient documentation

## 2020-02-29 DIAGNOSIS — Z419 Encounter for procedure for purposes other than remedying health state, unspecified: Secondary | ICD-10-CM

## 2020-02-29 DIAGNOSIS — M50321 Other cervical disc degeneration at C4-C5 level: Secondary | ICD-10-CM | POA: Diagnosis not present

## 2020-02-29 HISTORY — PX: ANTERIOR CERVICAL DECOMP/DISCECTOMY FUSION: SHX1161

## 2020-02-29 LAB — GLUCOSE, CAPILLARY
Glucose-Capillary: 148 mg/dL — ABNORMAL HIGH (ref 70–99)
Glucose-Capillary: 184 mg/dL — ABNORMAL HIGH (ref 70–99)
Glucose-Capillary: 268 mg/dL — ABNORMAL HIGH (ref 70–99)
Glucose-Capillary: 223 mg/dL — ABNORMAL HIGH (ref 70–99)

## 2020-02-29 SURGERY — ANTERIOR CERVICAL DECOMPRESSION/DISCECTOMY FUSION 1 LEVEL/HARDWARE REMOVAL
Anesthesia: General | Site: Spine Cervical

## 2020-02-29 MED ORDER — PHENOL 1.4 % MT LIQD
1.0000 | OROMUCOSAL | Status: DC | PRN
  Filled 2020-02-29: qty 177

## 2020-02-29 MED ORDER — ACETAMINOPHEN 500 MG PO TABS
ORAL_TABLET | ORAL | Status: AC
  Administered 2020-02-29: 1000 mg via ORAL
  Filled 2020-02-29: qty 2

## 2020-02-29 MED ORDER — CEFAZOLIN SODIUM-DEXTROSE 2-4 GM/100ML-% IV SOLN
2.0000 g | Freq: Three times a day (TID) | INTRAVENOUS | Status: AC
  Administered 2020-02-29 (×2): 2 g via INTRAVENOUS
  Filled 2020-02-29 (×2): qty 100

## 2020-02-29 MED ORDER — LIDOCAINE 2% (20 MG/ML) 5 ML SYRINGE
INTRAMUSCULAR | Status: DC | PRN
  Administered 2020-02-29: 60 mg via INTRAVENOUS

## 2020-02-29 MED ORDER — 0.9 % SODIUM CHLORIDE (POUR BTL) OPTIME
TOPICAL | Status: DC | PRN
  Administered 2020-02-29: 1000 mL

## 2020-02-29 MED ORDER — POLYVINYL ALCOHOL 1.4 % OP SOLN
1.0000 [drp] | Freq: Three times a day (TID) | OPHTHALMIC | Status: DC | PRN
  Filled 2020-02-29: qty 15

## 2020-02-29 MED ORDER — CEFAZOLIN SODIUM-DEXTROSE 2-4 GM/100ML-% IV SOLN
INTRAVENOUS | Status: AC
  Filled 2020-02-29: qty 100

## 2020-02-29 MED ORDER — PROPOFOL 10 MG/ML IV BOLUS
INTRAVENOUS | Status: AC
  Filled 2020-02-29: qty 20

## 2020-02-29 MED ORDER — LORATADINE 10 MG PO TABS
10.0000 mg | ORAL_TABLET | Freq: Every day | ORAL | Status: DC
  Administered 2020-02-29: 10 mg via ORAL
  Filled 2020-02-29: qty 1

## 2020-02-29 MED ORDER — BISACODYL 10 MG RE SUPP: 10.0000 mg | Freq: Every day | RECTAL | Status: DC | PRN

## 2020-02-29 MED ORDER — BACITRACIN ZINC 500 UNIT/GM EX OINT
TOPICAL_OINTMENT | CUTANEOUS | Status: DC | PRN
  Administered 2020-02-29: 1 via TOPICAL

## 2020-02-29 MED ORDER — PHENYLEPHRINE 40 MCG/ML (10ML) SYRINGE FOR IV PUSH (FOR BLOOD PRESSURE SUPPORT)
PREFILLED_SYRINGE | INTRAVENOUS | Status: AC
  Filled 2020-02-29: qty 10

## 2020-02-29 MED ORDER — FENTANYL CITRATE (PF) 100 MCG/2ML IJ SOLN: 25.0000 ug | INTRAMUSCULAR | Status: DC | PRN

## 2020-02-29 MED ORDER — LIDOCAINE 2% (20 MG/ML) 5 ML SYRINGE
INTRAMUSCULAR | Status: AC
  Filled 2020-02-29: qty 5

## 2020-02-29 MED ORDER — LACTATED RINGERS IV SOLN: INTRAVENOUS | Status: DC

## 2020-02-29 MED ORDER — FENTANYL CITRATE (PF) 250 MCG/5ML IJ SOLN
INTRAMUSCULAR | Status: DC | PRN
Start: 2020-02-29 — End: 2020-02-29
  Administered 2020-02-29: 100 ug via INTRAVENOUS

## 2020-02-29 MED ORDER — ALUM & MAG HYDROXIDE-SIMETH 200-200-20 MG/5ML PO SUSP: 30.0000 mL | Freq: Four times a day (QID) | ORAL | Status: DC | PRN

## 2020-02-29 MED ORDER — DEXAMETHASONE SODIUM PHOSPHATE 4 MG/ML IJ SOLN: 2.0000 mg | Freq: Four times a day (QID) | INTRAMUSCULAR | Status: AC

## 2020-02-29 MED ORDER — THROMBIN 5000 UNITS EX SOLR
CUTANEOUS | Status: AC
  Filled 2020-02-29: qty 5000

## 2020-02-29 MED ORDER — INSULIN ASPART 100 UNIT/ML ~~LOC~~ SOLN: 0.0000 [IU] | SUBCUTANEOUS | Status: DC

## 2020-02-29 MED ORDER — INSULIN ASPART 100 UNIT/ML ~~LOC~~ SOLN
0.0000 [IU] | Freq: Every day | SUBCUTANEOUS | Status: DC
  Administered 2020-02-29: 2 [IU] via SUBCUTANEOUS

## 2020-02-29 MED ORDER — PANTOPRAZOLE SODIUM 40 MG IV SOLR: 40.0000 mg | Freq: Every day | INTRAVENOUS | Status: DC

## 2020-02-29 MED ORDER — DEXAMETHASONE 4 MG PO TABS
2.0000 mg | ORAL_TABLET | Freq: Four times a day (QID) | ORAL | Status: AC
  Administered 2020-02-29 (×2): 2 mg via ORAL
  Filled 2020-02-29 (×2): qty 1

## 2020-02-29 MED ORDER — OXYCODONE HCL 5 MG PO TABS
10.0000 mg | ORAL_TABLET | ORAL | Status: DC | PRN
  Administered 2020-02-29 – 2020-03-01 (×3): 10 mg via ORAL
  Filled 2020-02-29 (×3): qty 2

## 2020-02-29 MED ORDER — LEVOTHYROXINE SODIUM 75 MCG PO TABS
75.0000 ug | ORAL_TABLET | Freq: Every day | ORAL | Status: DC
  Administered 2020-03-01: 75 ug via ORAL
  Filled 2020-02-29: qty 1

## 2020-02-29 MED ORDER — ROCURONIUM BROMIDE 10 MG/ML (PF) SYRINGE
PREFILLED_SYRINGE | INTRAVENOUS | Status: AC
  Filled 2020-02-29: qty 10

## 2020-02-29 MED ORDER — MENTHOL 3 MG MT LOZG: 1.0000 | LOZENGE | OROMUCOSAL | Status: DC | PRN

## 2020-02-29 MED ORDER — ACETAMINOPHEN 500 MG PO TABS: 1000.0000 mg | ORAL_TABLET | Freq: Once | ORAL | Status: AC

## 2020-02-29 MED ORDER — PHENYLEPHRINE 40 MCG/ML (10ML) SYRINGE FOR IV PUSH (FOR BLOOD PRESSURE SUPPORT)
PREFILLED_SYRINGE | INTRAVENOUS | Status: DC | PRN
  Administered 2020-02-29 (×2): 40 ug via INTRAVENOUS
  Administered 2020-02-29: 80 ug via INTRAVENOUS

## 2020-02-29 MED ORDER — ACETAMINOPHEN 500 MG PO TABS
1000.0000 mg | ORAL_TABLET | Freq: Four times a day (QID) | ORAL | Status: AC
  Administered 2020-02-29 – 2020-03-01 (×4): 1000 mg via ORAL
  Filled 2020-02-29 (×4): qty 2

## 2020-02-29 MED ORDER — METFORMIN HCL ER 500 MG PO TB24
500.0000 mg | ORAL_TABLET | Freq: Every day | ORAL | Status: DC
  Administered 2020-03-01: 500 mg via ORAL
  Filled 2020-02-29: qty 1

## 2020-02-29 MED ORDER — CHLORHEXIDINE GLUCONATE 0.12 % MT SOLN: 15.0000 mL | Freq: Once | OROMUCOSAL | Status: AC

## 2020-02-29 MED ORDER — BUPIVACAINE-EPINEPHRINE 0.5% -1:200000 IJ SOLN
INTRAMUSCULAR | Status: DC | PRN
  Administered 2020-02-29: 10 mL

## 2020-02-29 MED ORDER — ONDANSETRON HCL 4 MG/2ML IJ SOLN
INTRAMUSCULAR | Status: AC
  Filled 2020-02-29: qty 2

## 2020-02-29 MED ORDER — OXYCODONE HCL 5 MG PO TABS: 5.0000 mg | ORAL_TABLET | ORAL | Status: DC | PRN

## 2020-02-29 MED ORDER — THROMBIN 5000 UNITS EX SOLR
OROMUCOSAL | Status: DC | PRN
  Administered 2020-02-29: 5 mL

## 2020-02-29 MED ORDER — EPHEDRINE 5 MG/ML INJ
INTRAVENOUS | Status: AC
  Filled 2020-02-29: qty 10

## 2020-02-29 MED ORDER — SUGAMMADEX SODIUM 200 MG/2ML IV SOLN
INTRAVENOUS | Status: DC | PRN
  Administered 2020-02-29 (×2): 100 mg via INTRAVENOUS

## 2020-02-29 MED ORDER — DOCUSATE SODIUM 100 MG PO CAPS
100.0000 mg | ORAL_CAPSULE | Freq: Two times a day (BID) | ORAL | Status: DC
  Administered 2020-02-29 (×2): 100 mg via ORAL
  Filled 2020-02-29 (×2): qty 1

## 2020-02-29 MED ORDER — CHLORHEXIDINE GLUCONATE 0.12 % MT SOLN
OROMUCOSAL | Status: AC
  Administered 2020-02-29: 15 mL via OROMUCOSAL
  Filled 2020-02-29: qty 15

## 2020-02-29 MED ORDER — BACITRACIN ZINC 500 UNIT/GM EX OINT
TOPICAL_OINTMENT | CUTANEOUS | Status: AC
  Filled 2020-02-29: qty 28.35

## 2020-02-29 MED ORDER — SODIUM CHLORIDE 0.9 % IV SOLN
INTRAVENOUS | Status: DC | PRN
  Administered 2020-02-29: 500 mL

## 2020-02-29 MED ORDER — CEFAZOLIN SODIUM-DEXTROSE 2-4 GM/100ML-% IV SOLN
2.0000 g | INTRAVENOUS | Status: AC
  Administered 2020-02-29: 2 g via INTRAVENOUS

## 2020-02-29 MED ORDER — CHLORHEXIDINE GLUCONATE CLOTH 2 % EX PADS: 6.0000 | MEDICATED_PAD | Freq: Once | CUTANEOUS | Status: DC

## 2020-02-29 MED ORDER — MAGNESIUM OXIDE 400 (241.3 MG) MG PO TABS
200.0000 mg | ORAL_TABLET | Freq: Every day | ORAL | Status: DC
  Administered 2020-02-29: 200 mg via ORAL
  Filled 2020-02-29: qty 1

## 2020-02-29 MED ORDER — CYCLOBENZAPRINE HCL 10 MG PO TABS: 10.0000 mg | ORAL_TABLET | Freq: Three times a day (TID) | ORAL | Status: DC | PRN

## 2020-02-29 MED ORDER — DEXAMETHASONE SODIUM PHOSPHATE 10 MG/ML IJ SOLN
INTRAMUSCULAR | Status: DC | PRN
  Administered 2020-02-29: 10 mg via INTRAVENOUS

## 2020-02-29 MED ORDER — ONDANSETRON HCL 4 MG/2ML IJ SOLN: 4.0000 mg | Freq: Four times a day (QID) | INTRAMUSCULAR | Status: DC | PRN

## 2020-02-29 MED ORDER — ACETAMINOPHEN 650 MG RE SUPP: 650.0000 mg | RECTAL | Status: DC | PRN

## 2020-02-29 MED ORDER — PROPOFOL 10 MG/ML IV BOLUS
INTRAVENOUS | Status: DC | PRN
  Administered 2020-02-29: 200 mg via INTRAVENOUS

## 2020-02-29 MED ORDER — ACETAMINOPHEN 325 MG PO TABS: 650.0000 mg | ORAL_TABLET | ORAL | Status: DC | PRN

## 2020-02-29 MED ORDER — PRAMIPEXOLE DIHYDROCHLORIDE 1 MG PO TABS
1.0000 mg | ORAL_TABLET | Freq: Two times a day (BID) | ORAL | Status: DC
  Administered 2020-02-29: 1 mg via ORAL
  Filled 2020-02-29 (×2): qty 1

## 2020-02-29 MED ORDER — ONDANSETRON HCL 4 MG/2ML IJ SOLN: 4.0000 mg | Freq: Once | INTRAMUSCULAR | Status: DC | PRN

## 2020-02-29 MED ORDER — LACTATED RINGERS IV SOLN: INTRAVENOUS | Status: DC | PRN

## 2020-02-29 MED ORDER — PANTOPRAZOLE SODIUM 40 MG PO TBEC
40.0000 mg | DELAYED_RELEASE_TABLET | Freq: Every day | ORAL | Status: DC
  Administered 2020-02-29: 40 mg via ORAL
  Filled 2020-02-29: qty 1

## 2020-02-29 MED ORDER — ROCURONIUM BROMIDE 10 MG/ML (PF) SYRINGE
PREFILLED_SYRINGE | INTRAVENOUS | Status: DC | PRN
  Administered 2020-02-29: 60 mg via INTRAVENOUS

## 2020-02-29 MED ORDER — CITALOPRAM HYDROBROMIDE 20 MG PO TABS
20.0000 mg | ORAL_TABLET | Freq: Every evening | ORAL | Status: DC
  Administered 2020-02-29: 20 mg via ORAL
  Filled 2020-02-29: qty 1

## 2020-02-29 MED ORDER — EPHEDRINE SULFATE-NACL 50-0.9 MG/10ML-% IV SOSY
PREFILLED_SYRINGE | INTRAVENOUS | Status: DC | PRN
  Administered 2020-02-29 (×3): 10 mg via INTRAVENOUS

## 2020-02-29 MED ORDER — INSULIN ASPART 100 UNIT/ML ~~LOC~~ SOLN
0.0000 [IU] | Freq: Three times a day (TID) | SUBCUTANEOUS | Status: DC
  Administered 2020-02-29: 4 [IU] via SUBCUTANEOUS
  Administered 2020-02-29: 11 [IU] via SUBCUTANEOUS

## 2020-02-29 MED ORDER — FENTANYL CITRATE (PF) 250 MCG/5ML IJ SOLN
INTRAMUSCULAR | Status: AC
  Filled 2020-02-29: qty 5

## 2020-02-29 MED ORDER — FUROSEMIDE 20 MG PO TABS
20.0000 mg | ORAL_TABLET | Freq: Every day | ORAL | Status: DC
  Administered 2020-02-29: 20 mg via ORAL
  Filled 2020-02-29: qty 1

## 2020-02-29 MED ORDER — INSULIN ASPART 100 UNIT/ML ~~LOC~~ SOLN
0.0000 [IU] | Freq: Three times a day (TID) | SUBCUTANEOUS | Status: DC
  Administered 2020-03-01: 7 [IU] via SUBCUTANEOUS

## 2020-02-29 MED ORDER — DEXAMETHASONE SODIUM PHOSPHATE 10 MG/ML IJ SOLN
INTRAMUSCULAR | Status: AC
  Filled 2020-02-29: qty 1

## 2020-02-29 MED ORDER — ONDANSETRON HCL 4 MG/2ML IJ SOLN
INTRAMUSCULAR | Status: DC | PRN
  Administered 2020-02-29: 4 mg via INTRAVENOUS

## 2020-02-29 MED ORDER — ORAL CARE MOUTH RINSE: 15.0000 mL | Freq: Once | OROMUCOSAL | Status: AC

## 2020-02-29 MED ORDER — ONDANSETRON HCL 4 MG PO TABS: 4.0000 mg | ORAL_TABLET | Freq: Four times a day (QID) | ORAL | Status: DC | PRN

## 2020-02-29 MED ORDER — FLUTICASONE PROPIONATE 50 MCG/ACT NA SUSP
1.0000 | Freq: Every day | NASAL | Status: DC | PRN
  Filled 2020-02-29: qty 16

## 2020-02-29 MED ORDER — MORPHINE SULFATE (PF) 4 MG/ML IV SOLN: 4.0000 mg | INTRAVENOUS | Status: DC | PRN

## 2020-02-29 MED ORDER — BUPIVACAINE-EPINEPHRINE 0.5% -1:200000 IJ SOLN
INTRAMUSCULAR | Status: AC
  Filled 2020-02-29: qty 1

## 2020-02-29 SURGICAL SUPPLY — 61 items
APL SKNCLS STERI-STRIP NONHPOA (GAUZE/BANDAGES/DRESSINGS) ×1
BAG DECANTER FOR FLEXI CONT (MISCELLANEOUS) ×3 IMPLANT
BAND INSRT 18 STRL LF DISP RB (MISCELLANEOUS)
BAND RUBBER #18 3X1/16 STRL (MISCELLANEOUS) IMPLANT
BENZOIN TINCTURE PRP APPL 2/3 (GAUZE/BANDAGES/DRESSINGS) ×4 IMPLANT
BIT DRILL NEURO 2X3.1 SFT TUCH (MISCELLANEOUS) ×1 IMPLANT
BLADE SURG 15 STRL LF DISP TIS (BLADE) ×1 IMPLANT
BLADE SURG 15 STRL SS (BLADE) ×3
BLADE ULTRA TIP 2M (BLADE) ×3 IMPLANT
BUR BARREL STRAIGHT FLUTE 4.0 (BURR) ×3 IMPLANT
BUR MATCHSTICK NEURO 3.0 LAGG (BURR) ×5 IMPLANT
CANISTER SUCT 3000ML PPV (MISCELLANEOUS) ×3 IMPLANT
CARTRIDGE OIL MAESTRO DRILL (MISCELLANEOUS) ×1 IMPLANT
CLOSURE WOUND 1/2 X4 (GAUZE/BANDAGES/DRESSINGS) ×1
COVER MAYO STAND STRL (DRAPES) ×3 IMPLANT
COVER WAND RF STERILE (DRAPES) ×3 IMPLANT
DECANTER SPIKE VIAL GLASS SM (MISCELLANEOUS) ×3 IMPLANT
DIFFUSER DRILL AIR PNEUMATIC (MISCELLANEOUS) ×3 IMPLANT
DRAIN JACKSON PRATT 10MM FLAT (MISCELLANEOUS) ×2 IMPLANT
DRAPE LAPAROTOMY 100X72 PEDS (DRAPES) ×3 IMPLANT
DRAPE MICROSCOPE LEICA (MISCELLANEOUS) IMPLANT
DRAPE SURG 17X23 STRL (DRAPES) ×6 IMPLANT
DRILL NEURO 2X3.1 SOFT TOUCH (MISCELLANEOUS) ×3
DRSG OPSITE POSTOP 3X4 (GAUZE/BANDAGES/DRESSINGS) ×3 IMPLANT
ELECT BLADE 4.0 EZ CLEAN MEGAD (MISCELLANEOUS) ×3
ELECT REM PT RETURN 9FT ADLT (ELECTROSURGICAL) ×3
ELECTRODE BLDE 4.0 EZ CLN MEGD (MISCELLANEOUS) IMPLANT
ELECTRODE REM PT RTRN 9FT ADLT (ELECTROSURGICAL) ×1 IMPLANT
EVACUATOR SILICONE 100CC (DRAIN) ×2 IMPLANT
GAUZE 4X4 16PLY RFD (DISPOSABLE) IMPLANT
GLOVE BIO SURGEON STRL SZ8 (GLOVE) ×3 IMPLANT
GLOVE BIO SURGEON STRL SZ8.5 (GLOVE) ×3 IMPLANT
GLOVE EXAM NITRILE XL STR (GLOVE) IMPLANT
GOWN STRL REUS W/ TWL LRG LVL3 (GOWN DISPOSABLE) IMPLANT
GOWN STRL REUS W/ TWL XL LVL3 (GOWN DISPOSABLE) ×1 IMPLANT
GOWN STRL REUS W/TWL LRG LVL3 (GOWN DISPOSABLE)
GOWN STRL REUS W/TWL XL LVL3 (GOWN DISPOSABLE) ×3
HEMOSTAT POWDER KIT SURGIFOAM (HEMOSTASIS) ×3 IMPLANT
KIT BASIN OR (CUSTOM PROCEDURE TRAY) ×3 IMPLANT
KIT TURNOVER KIT B (KITS) ×3 IMPLANT
MARKER SKIN DUAL TIP RULER LAB (MISCELLANEOUS) ×3 IMPLANT
NDL SPNL 18GX3.5 QUINCKE PK (NEEDLE) ×1 IMPLANT
NEEDLE HYPO 22GX1.5 SAFETY (NEEDLE) ×3 IMPLANT
NEEDLE SPNL 18GX3.5 QUINCKE PK (NEEDLE) ×3 IMPLANT
NS IRRIG 1000ML POUR BTL (IV SOLUTION) ×3 IMPLANT
OIL CARTRIDGE MAESTRO DRILL (MISCELLANEOUS) ×3
PACK LAMINECTOMY NEURO (CUSTOM PROCEDURE TRAY) ×3 IMPLANT
PIN DISTRACTION 14MM (PIN) ×6 IMPLANT
PLATE ONE LEVEL SKYLINE 16MM (Plate) ×2 IMPLANT
PUTTY DBM 2CC CALC GRAN (Putty) ×2 IMPLANT
SCREW SELF DRILL SKYLINE 12MM (Screw) ×8 IMPLANT
SPACER CIF 6 8D SM (Spacer) ×2 IMPLANT
SPONGE INTESTINAL PEANUT (DISPOSABLE) ×6 IMPLANT
SPONGE SURGIFOAM ABS GEL SZ50 (HEMOSTASIS) IMPLANT
STRIP CLOSURE SKIN 1/2X4 (GAUZE/BANDAGES/DRESSINGS) ×2 IMPLANT
SUT VIC AB 0 CT1 27 (SUTURE) ×3
SUT VIC AB 0 CT1 27XBRD ANTBC (SUTURE) ×1 IMPLANT
SUT VIC AB 3-0 SH 8-18 (SUTURE) ×3 IMPLANT
TOWEL GREEN STERILE (TOWEL DISPOSABLE) ×3 IMPLANT
TOWEL GREEN STERILE FF (TOWEL DISPOSABLE) ×3 IMPLANT
WATER STERILE IRR 1000ML POUR (IV SOLUTION) ×3 IMPLANT

## 2020-02-29 NOTE — Progress Notes (Signed)
Orthopedic Tech Progress Note Patient Details:  Valerie Duran Oct 03, 1945 [id number] Patient already has Aspen cervical collar per RN Caryl Pina.  Patient ID: Valerie Duran, female   DOB: 1945-09-27, 74 y.o.   MRN: [id number]   Valerie Duran 02/29/2020, 2:10 PM

## 2020-02-29 NOTE — Evaluation (Signed)
Physical Therapy Evaluation Patient Details Name: Valerie Duran MRN: [id number] DOB: August 14, 1945 Today's Date: 02/29/2020   History of Present Illness  Pt is a 74 y/o female s/p C4-5 ACDF. PMH includes arthritis, DM, MVP, s/p bilateral TKA, s/p bilateral THA, s/p back surgery.   Clinical Impression  Patient is s/p above surgery resulting in the deficits listed below (see PT Problem List). Pt with mild unsteadiness and light headedness. Required min guard A for mobility using RW. Educated about generalized walking program and cervical precautions. Reports husband will be available at home to assist. Patient will benefit from skilled PT to increase their independence and safety with mobility (while adhering to their precautions) to allow discharge to the venue listed below.     Follow Up Recommendations No PT follow up;Supervision for mobility/OOB    Equipment Recommendations  None recommended by PT    Recommendations for Other Services       Precautions / Restrictions Precautions Precautions: Cervical Precaution Booklet Issued: Yes (comment) Precaution Comments: Reviewed cervical precautions with pt.  Required Braces or Orthoses: Cervical Brace Cervical Brace: Hard collar Restrictions Weight Bearing Restrictions: No      Mobility  Bed Mobility Overal bed mobility: Needs Assistance Bed Mobility: Supine to Sit;Sit to Supine     Supine to sit: Min guard Sit to supine: Min guard   General bed mobility comments: Min guard for safety to perform bed mobility tasks. Increased time required.   Transfers Overall transfer level: Needs assistance Equipment used: Rolling walker (2 wheeled) Transfers: Sit to/from Stand Sit to Stand: Min guard         General transfer comment: Min guard A for safety. Reports some light headedness, however, improved with standing rest.   Ambulation/Gait Ambulation/Gait assistance: Min guard Gait Distance (Feet): 100 Feet Assistive device:  Rolling walker (2 wheeled) Gait Pattern/deviations: Step-through pattern;Decreased stride length Gait velocity: Decreased   General Gait Details: Slow, cautious gait. Mild unsteadiness noted. Min guard for safety. Educated about generalized walking program to perform at home.   Stairs            Wheelchair Mobility    Modified Rankin (Stroke Patients Only)       Balance Overall balance assessment: Needs assistance Sitting-balance support: No upper extremity supported;Feet supported Sitting balance-Leahy Scale: Fair     Standing balance support: Bilateral upper extremity supported;During functional activity Standing balance-Leahy Scale: Poor Standing balance comment: Reliant on BUE support                              Pertinent Vitals/Pain Pain Assessment: Faces Faces Pain Scale: Hurts even more Pain Location: neck  Pain Descriptors / Indicators: Aching;Operative site guarding Pain Intervention(s): Monitored during session;Limited activity within patient's tolerance;Repositioned    Home Living Family/patient expects to be discharged to:: Private residence Living Arrangements: Spouse/significant other Available Help at Discharge: Family Type of Home: House Home Access: Level entry     Home Layout: One level Home Equipment: Environmental consultant - 2 wheels;Walker - 4 wheels;Shower seat (has upright walker)      Prior Function Level of Independence: Independent with assistive device(s)         Comments: Reports she uses her upright walker for mobility tasks outside of home. Sometimes does not use when inside of home.      Hand Dominance        Extremity/Trunk Assessment   Upper Extremity Assessment Upper Extremity Assessment: Defer to OT  evaluation    Lower Extremity Assessment Lower Extremity Assessment: Generalized weakness    Cervical / Trunk Assessment Cervical / Trunk Assessment: Other exceptions Cervical / Trunk Exceptions: s/p ACDF   Communication   Communication: No difficulties  Cognition Arousal/Alertness: Awake/alert Behavior During Therapy: WFL for tasks assessed/performed Overall Cognitive Status: Within Functional Limits for tasks assessed                                        General Comments General comments (skin integrity, edema, etc.): Pt's daughter present during session     Exercises     Assessment/Plan    PT Assessment Patient needs continued PT services  PT Problem List Decreased strength;Decreased balance;Decreased mobility;Decreased knowledge of use of DME;Decreased activity tolerance;Pain       PT Treatment Interventions DME instruction;Gait training;Functional mobility training;Therapeutic activities;Balance training;Therapeutic exercise;Patient/family education    PT Goals (Current goals can be found in the Care Plan section)  Acute Rehab PT Goals Patient Stated Goal: to go home PT Goal Formulation: With patient Time For Goal Achievement: 03/14/20 Potential to Achieve Goals: Good    Frequency Min 5X/week   Barriers to discharge        Co-evaluation               AM-PAC PT "6 Clicks" Mobility  Outcome Measure Help needed turning from your back to your side while in a flat bed without using bedrails?: A Little Help needed moving from lying on your back to sitting on the side of a flat bed without using bedrails?: A Little Help needed moving to and from a bed to a chair (including a wheelchair)?: A Little Help needed standing up from a chair using your arms (e.g., wheelchair or bedside chair)?: A Little Help needed to walk in hospital room?: A Little Help needed climbing 3-5 steps with a railing? : A Little 6 Click Score: 18    End of Session Equipment Utilized During Treatment: Gait belt;Cervical collar Activity Tolerance: Patient tolerated treatment well Patient left: in bed;with call bell/phone within reach;with family/visitor present Nurse  Communication: Mobility status PT Visit Diagnosis: Unsteadiness on feet (R26.81);Muscle weakness (generalized) (M62.81)    Time: 6270-3500 PT Time Calculation (min) (ACUTE ONLY): 20 min   Charges:   PT Evaluation $PT Eval Low Complexity: 1 Low          Lou Miner, DPT  Acute Rehabilitation Services  Pager: [phone] Office: [phone]   Rudean Hitt 02/29/2020, 12:22 PM

## 2020-02-29 NOTE — Progress Notes (Signed)
   Providing Compassionate, Quality Care - Together   Subjective: Patient reports no issues. She denies shortness of breath or difficulty swallowing. She denies numbness, tingling, or weakness of her extremities.  Objective: Vital signs in last 24 hours: Temp:  [97.7 F (36.5 C)-97.9 F (36.6 C)] 97.9 F (36.6 C) (09/09 1010) Pulse Rate:  [88-95] 88 (09/09 1025) Resp:  [18-29] 23 (09/09 1025) BP: (136-147)/(63-69) 137/63 (09/09 1025) SpO2:  [99 %-100 %] 99 % (09/09 1025) Weight:  [87.1 kg] 87.1 kg (09/09 0607)  Intake/Output from previous day: No intake/output data recorded. Intake/Output this shift: Total I/O In: 1200 [I.V.:1100; IV Piggyback:100] Out: 100 [Blood:100]  Alert and oriented x 4 PERRLA CN II-XII grossly intact MAE, Strength and sensation intact Incision is covered with Honeycomb dressing and Steri Strips; Dressing is clean, dry, and intact   Lab Results: Recent Labs    02/27/20 1344  WBC 6.0  HGB 13.0  HCT 40.2  PLT 294   BMET Recent Labs    02/27/20 1344  NA 134*  K 3.6  CL 98  CO2 27  GLUCOSE 228*  BUN 11  CREATININE 0.79  CALCIUM 9.1    Studies/Results: No results found.  Assessment/Plan: Patient is recovering in the PACU having just undergone C4-5 ACDF by Dr. Arnoldo Morale. She is doing well postoperatively. She is getting ready to transfer to her room on 3C.   LOS: 0 days    Viona Gilmore, DNP, AGNP-C Nurse Practitioner  [facility] [place] [address], [place], [place], [place] [address] P: [phone]    F: [phone]  02/29/2020, 10:44 AM

## 2020-02-29 NOTE — Op Note (Signed)
Brief history: The patient is a 74 year old white female on whom I previously performed of C5-6 and C6-7 anterior cervical discectomy, fusion and plating.  She initially did well but has developed worsening neck pain.  She has failed medical management.  She was worked up with a cervical MRI which demonstrated a C4-5 spondylolisthesis and disc degeneration.  I discussed the various treatment options with her.  She has decided to proceed with surgery after weighing the risks benefits and alternatives.  Preoperative diagnosis: Cervical spondylolisthesis, cervical degenerative disease, cervicalgia  Postoperative diagnosis: The same  Procedure: C4-5 anterior cervical discectomy/decompression; C4-5 interbody arthrodesis with local morcellized autograft bone and Zimmer DBM; insertion of interbody prosthesis at C4-5 (DePuy titanium interbody prosthesis); anterior cervical plating from C4-5 with DePuy titanium plate  Surgeon: Dr. Earle Gell  Asst.: Arnetha Massy, NP  Anesthesia: Gen. endotracheal  Estimated blood loss: 75 cc  Drains: One 10 mm flat Jackson-Pratt drain in the prevertebral space  Complications: None  Description of procedure: The patient was brought to the operating room by the anesthesia team. General endotracheal anesthesia was induced. A roll was placed under the patient's shoulders to keep the neck in the neutral position. The patient's anterior cervical region was then prepared with Betadine scrub and Betadine solution. Sterile drapes were applied.  The area to be incised was then injected with Marcaine with epinephrine solution. I then used a scalpel to make a transverse incision in the patient's left anterior neck. I used the Metzenbaum scissors to dissect through the scar tissue and to divide the platysmal muscle and then to dissect medial to the sternocleidomastoid muscle, jugular vein, and carotid artery. I carefully dissected down towards the anterior cervical spine  identifying the esophagus and retracting it medially. Then using Kitner swabs to clear soft tissue from the anterior cervical spine, and to expose the old cervical plate at  P8-2 and U2-3.  We explored the fusion by unlocking the cams from the old plate, removing the old screws and then removing the old plate.  The arthrodesis at C5-6 and C6-7 appeared solid.   I then used electrocautery to detach the medial border of the longus colli muscle bilaterally from the C4-5 intervertebral disc spaces. I then inserted the Caspar self-retaining retractor underneath the longus colli muscle bilaterally to provide exposure.  We then incised the intervertebral disc at C4-5. We then performed a partial intervertebral discectomy with a pituitary forceps and the Karlin curettes. I then inserted distraction screws into the vertebral bodies at C4 and C5. We then distracted the interspace. We then used the high-speed drill to decorticate the vertebral endplates at N3-6, to drill away the remainder of the intervertebral disc, to drill away some posterior spondylosis, and to thin out the posterior longitudinal ligament. I then incised ligament with the arachnoid knife. We then removed the ligament with a Kerrison punches undercutting the vertebral endplates and decompressing the thecal sac. We then performed foraminotomies about the bilateral C5 nerve roots. This completed the decompression at this level.  We now turned our to attention to the interbody fusion. We used the trial spacers to determine the appropriate size for the interbody prosthesis. We then pre-filled prosthesis with a combination of local morcellized autograft bone that we obtained during decompression as well as Zimmer DBM. We then inserted the prosthesis into the distracted interspace at C4-5. We then removed the distraction screws. There was a good snug fit of the prosthesis in the interspace.  Having completed the fusion we now  turned attention to the  anterior spinal instrumentation. We used the high-speed drill to drill away some anterior spondylosis at the disc spaces so that the plate lay down flat. We selected the appropriate length titanium anterior cervical plate. We laid it along the anterior aspect of the vertebral bodies from C4-5.  We then secured the plate to the vertebral bodies by placing two 12 mm screws C4 and C5, we used the old screw holes at C5.   We then obtained intraoperative radiograph. The demonstrating good position of the instrumentation. We therefore secured the screws the plate the locking each cam. This completed the instrumentation.  We then obtained hemostasis using bipolar electrocautery. We irrigated the wound out with bacitracin solution. We then removed the retractor. We inspected the esophagus for any damage. There was none apparent.  We placed a 10 mm flat Jackson-Pratt drain in the prevertebral space and tunneled it out through a separate stab wound.  We then reapproximated patient's platysmal muscle with interrupted 3-0 Vicryl suture. We then reapproximated the subcutaneous tissue with interrupted 3-0 Vicryl suture. The skin was reapproximated with Steri-Strips and benzoin. The wound was then covered with bacitracin ointment. A sterile dressing was applied. The drapes were removed. Patient was subsequently extubated by the anesthesia team and transported to the post anesthesia care unit in stable condition. All sponge instrument and needle counts were reportedly correct at the end of this case.

## 2020-02-29 NOTE — Anesthesia Postprocedure Evaluation (Signed)
Anesthesia Post Note  Patient: Valerie Duran  Procedure(s) Performed: ANTERIOR CERVICAL DECOMPRESSION/DISCECTOMY FUSION, INTERBODY PROSTHESIS, PLATE/SCREWS CERVICAL FOUR- CERVICAL FIVE; EXPLORE FUSION;RMOVE OLD PLATE (N/A Spine Cervical)     Patient location during evaluation: PACU Anesthesia Type: General Level of consciousness: awake Pain management: pain level controlled Vital Signs Assessment: post-procedure vital signs reviewed and stable Respiratory status: spontaneous breathing, nonlabored ventilation, respiratory function stable and patient connected to nasal cannula oxygen Cardiovascular status: blood pressure returned to baseline and stable Postop Assessment: no apparent nausea or vomiting Anesthetic complications: no   No complications documented.  Last Vitals:  Vitals:   02/29/20 1052 02/29/20 1313  BP: (!) 150/82 138/64  Pulse: 91 88  Resp: 18 16  Temp: 36.5 C 36.4 C  SpO2: 100% 99%    Last Pain:  Vitals:   02/29/20 1313  TempSrc: Oral  PainSc:                  [name] P Tra Wile[place]

## 2020-02-29 NOTE — Transfer of Care (Signed)
Immediate Anesthesia Transfer of Care Note  Patient: Valerie Duran  Procedure(s) Performed: ANTERIOR CERVICAL DECOMPRESSION/DISCECTOMY FUSION, INTERBODY PROSTHESIS, PLATE/SCREWS CERVICAL FOUR- CERVICAL FIVE; EXPLORE FUSION;RMOVE OLD PLATE (N/A Spine Cervical)  Patient Location: PACU  Anesthesia Type:General  Level of Consciousness: awake, alert  and oriented  Airway & Oxygen Therapy: Patient Spontanous Breathing and Patient connected to face mask oxygen  Post-op Assessment: Report given to RN and Post -op Vital signs reviewed and stable  Post vital signs: Reviewed and stable  Last Vitals:  Vitals Value Taken Time  BP 136/69 02/29/20 1010  Temp    Pulse 93 02/29/20 1011  Resp 33 02/29/20 1011  SpO2 100 % 02/29/20 1011  Vitals shown include unvalidated device data.  Last Pain:  Vitals:   02/29/20 0639  PainSc: 0-No pain         Complications: No complications documented.

## 2020-02-29 NOTE — Anesthesia Procedure Notes (Signed)
Procedure Name: Intubation Date/Time: 02/29/2020 7:44 AM Performed by: Trinna Post., CRNA Pre-anesthesia Checklist: Patient identified, Emergency Drugs available, Suction available, Patient being monitored and Timeout performed Patient Re-evaluated:Patient Re-evaluated prior to induction Oxygen Delivery Method: Circle system utilized Preoxygenation: Pre-oxygenation with 100% oxygen Induction Type: IV induction Ventilation: Mask ventilation without difficulty and Oral airway inserted - appropriate to patient size Laryngoscope Size: Glidescope and 3 Grade View: Grade I Tube type: Oral Tube size: 7.0 mm Number of attempts: 1 Airway Equipment and Method: Rigid stylet and Video-laryngoscopy Placement Confirmation: ETT inserted through vocal cords under direct vision,  positive ETCO2 and breath sounds checked- equal and bilateral Secured at: 22 cm Tube secured with: Tape Dental Injury: Teeth and Oropharynx as per pre-operative assessment

## 2020-02-29 NOTE — H&P (Signed)
Subjective: The patient is a 74 year old white female who has had a previous anterior cervicectomy, fusion and plating.  She has developed recurrent neck pain.  She was worked up with a cervical MRI and cervical x-rays which demonstrated a C4-5 spondylolisthesis and stenosis.  I discussed the various treatment options.  She has decided to proceed with surgery.  Past Medical History:  Diagnosis Date  . Anemia   . Arthritis   . Back pain   . Celiac disease   . Depression   . Diabetes mellitus without complication ([place])   . GERD (gastroesophageal reflux disease)   . Heart murmur   . Hypothyroidism   . MVP (mitral valve prolapse)   . Pneumonia   . RLS (restless legs syndrome)     Past Surgical History:  Procedure Laterality Date  . ABDOMINAL HYSTERECTOMY    . APPLICATION OF INTRAOPERATIVE CT SCAN N/A 07/06/2018   Procedure: APPLICATION OF INTRAOPERATIVE CT SCAN;  Surgeon: Newman Pies, MD;  Location: [facility];  Service: Neurosurgery;  Laterality: N/A;  . BACK SURGERY     spinal fusion  . CATARACT EXTRACTION W/ INTRAOCULAR LENS  IMPLANT, BILATERAL Bilateral   . [place]  . COLONOSCOPY WITH PROPOFOL N/A 02/04/2018   Procedure: COLONOSCOPY WITH PROPOFOL;  Surgeon: Manya Silvas, MD;  Location[facility] ENDOSCOPY;  Service: Endoscopy;  Laterality: N/A;  . ESOPHAGOGASTRODUODENOSCOPY (EGD) WITH PROPOFOL N/A 02/04/2018   Procedure: ESOPHAGOGASTRODUODENOSCOPY (EGD) WITH PROPOFOL;  Surgeon: Manya Silvas, MD;  [facility];  Service: Endoscopy;  Laterality: N/A;  . EYE SURGERY    . FOOT SURGERY Right 2007  . FRACTURE SURGERY    . HARDWARE REMOVAL Right 01/14/2015   Procedure: HARDWARE REMOVAL/ REMOVAL OF 3 CANNULATED SCREWS FROM RIGHT HIP.;  Surgeon: Dereck Leep, MD;  Location: [facility];  Service: Orthopedics;  Laterality: Right;  . HIP FRACTURE SURGERY Right 2016  . JOINT REPLACEMENT Bilateral 2014   knees  . NECK SURGERY     fusion  . TOTAL HIP  ARTHROPLASTY Right 01/14/2015   Procedure: TOTAL HIP ARTHROPLASTY;  Surgeon: Dereck Leep, MD;  Location: [facility];  Service: Orthopedics;  Laterality: Right;    Allergies  Allergen Reactions  . Ambien [Zolpidem Tartrate] Other (See Comments)    Altered mental status  . Barley Grass Other (See Comments)    Celiac disease  . Wheat Bran Rash    Celiac disease  . Tape Other (See Comments)    Adhesive tape - tears skin  . Codeine Nausea Only  . Ibuprofen Other (See Comments) and Rash    GI upset " makes my stomach hurt"  . Latex Itching    Patient says she's allergic to Latex during morning assessment the day of surgery.   . Simvastatin Rash  . Valium [Diazepam] Other (See Comments)    Sleep walks    Social History   Tobacco Use  . Smoking status: Never Smoker  . Smokeless tobacco: Never Used  Substance Use Topics  . Alcohol use: No    Family History  Problem Relation Age of Onset  . Cancer Father 31       Colon Cancer  . Breast cancer Father 80  . Breast cancer Sister   . Stroke Mother   . Heart disease Maternal Grandmother    Prior to Admission medications   Medication Sig Start Date End Date Taking? Authorizing Provider  acetaminophen (TYLENOL) 650 MG CR tablet Take 650-1,300 mg by mouth  every 8 (eight) hours as needed for pain.   Yes [provider]  Biotin 5000 MCG CAPS Take 5,000 mcg by mouth daily.   Yes [provider]  citalopram (CELEXA) 20 MG tablet Take 20 mg by mouth every evening.  10/09/19  Yes [provider]  fexofenadine (ALLEGRA) 180 MG tablet Take 180 mg by mouth daily as needed (allergies.).    Yes [provider]  fluticasone (FLONASE) 50 MCG/ACT nasal spray Place 1-2 sprays into both nostrils daily as needed (allergies/congestion.).  12/13/17  Yes [provider]  furosemide (LASIX) 20 MG tablet Take 20 mg by mouth daily. 11/14/19  Yes [provider]  Glucosamine Sulfate 1000 MG CAPS Take 2,000 mg  by mouth daily.   Yes [provider]  levothyroxine (SYNTHROID) 75 MCG tablet Take 75 mcg by mouth daily before breakfast.  12/11/19  Yes [provider]  Magnesium Oxide (MAG-200) 200 MG TABS Take 200 mg by mouth daily.   Yes [provider]  metFORMIN (GLUCOPHAGE-XR) 500 MG 24 hr tablet Take 500 mg by mouth daily with breakfast.  09/09/17 02/18/21 Yes [provider]  Polyethyl Glycol-Propyl Glycol (LUBRICANT EYE DROPS) 0.4-0.3 % SOLN Place 1 drop into both eyes 3 (three) times daily as needed (dry/irritated eyes.).   Yes [provider]  pramipexole (MIRAPEX) 1 MG tablet Take 1 mg by mouth 2 (two) times daily.  06/28/14  Yes [provider]  amoxicillin (AMOXIL) 500 MG capsule Take 2,000 mg by mouth See admin instructions. Take 4 capsules (2000 mg) by mouth 1 hour prior to dental appointments    [provider]     Review of Systems  Positive ROS: As above  All other systems have been reviewed and were otherwise negative with the exception of those mentioned in the HPI and as above.  Objective: Vital signs in last 24 hours: Temp:  [97.7 F (36.5 C)] 97.7 F (36.5 C) (09/09 0607) Pulse Rate:  [88] 88 (09/09 0607) Resp:  [18] 18 (09/09 0607) BP: (147)/(65) 147/65 (09/09 0607) SpO2:  [99 %] 99 % (09/09 0607) Weight:  [87.1 kg] 87.1 kg (09/09 0607) Estimated body mass index is 29.19 kg/m as calculated from the following:   Height as of this encounter: 5' 8"  (1.727 m).   Weight as of this encounter: 87.1 kg.   General Appearance: Alert Head: Normocephalic, without obvious abnormality, atraumatic Eyes: PERRL, conjunctiva/corneas clear, EOM's intact,    Ears: Normal  Throat: Normal  Neck: Her cervical incision is well-healed.  She has limited cervical range of motion. Back: Her lumbar incision is well-healed. Lungs: Clear to auscultation bilaterally, respirations unlabored Heart: Regular rate and rhythm, no murmur, rub or  gallop Abdomen: Soft, non-tender Extremities: Extremities normal, atraumatic, no cyanosis or edema Skin: unremarkable  NEUROLOGIC:   Mental status: alert and oriented,Motor Exam - grossly normal Sensory Exam - grossly normal Reflexes:  Coordination - grossly normal Gait - grossly normal Balance - grossly normal Cranial Nerves: I: smell Not tested  II: visual acuity  OS: Normal  OD: Normal   II: visual fields Full to confrontation  II: pupils Equal, round, reactive to light  III,VII: ptosis None  III,IV,VI: extraocular muscles  Full ROM  V: mastication Normal  V: facial light touch sensation  Normal  V,VII: corneal reflex  Present  VII: facial muscle function - upper  Normal  VII: facial muscle function - lower Normal  VIII: hearing Not tested  IX: soft palate elevation  Normal  IX,X: gag reflex Present  XI: trapezius strength  5/5  XI: sternocleidomastoid strength 5/5  XI: neck flexion strength  5/5  XII: tongue strength  Normal    Data Review Lab Results  Component Value Date   WBC 6.0 02/27/2020   HGB 13.0 02/27/2020   HCT 40.2 02/27/2020   MCV 92.8 02/27/2020   PLT 294 02/27/2020   Lab Results  Component Value Date   NA 134 (L) 02/27/2020   K 3.6 02/27/2020   CL 98 02/27/2020   CO2 27 02/27/2020   BUN 11 02/27/2020   CREATININE 0.79 02/27/2020   GLUCOSE 228 (H) 02/27/2020   Lab Results  Component Value Date   INR 0.97 01/09/2015    Assessment/Plan: Cervical spinal listhesis, cervicalgia: I have discussed the situation with the patient.  I reviewed her imaging studies with her and pointed out the abnormalities.  We have discussed the various treatment options including surgery.  I have described the surgical treatment option of a C4-5 anterior cervicectomy, fusion and plating with removal of her old plate.  I have given her a surgical pamphlet.  I have shown her surgical models.  We have discussed the risk, benefits, alternatives, expected postoperative  course, and likelihood of achieving goals with surgery.  I have answered all her questions.  She has decided to proceed with surgery.   Ophelia Charter 02/29/2020 7:20 AM

## 2020-03-01 DIAGNOSIS — M4312 Spondylolisthesis, cervical region: Secondary | ICD-10-CM | POA: Diagnosis not present

## 2020-03-01 LAB — GLUCOSE, CAPILLARY: Glucose-Capillary: 222 mg/dL — ABNORMAL HIGH (ref 70–99)

## 2020-03-01 MED ORDER — OXYCODONE HCL 10 MG PO TABS: 10.0000 mg | ORAL_TABLET | ORAL | 0 refills | Status: DC | PRN

## 2020-03-01 MED ORDER — CYCLOBENZAPRINE HCL 10 MG PO TABS: 10.0000 mg | ORAL_TABLET | Freq: Three times a day (TID) | ORAL | 0 refills | Status: DC | PRN

## 2020-03-01 MED ORDER — DOCUSATE SODIUM 100 MG PO CAPS: 100.0000 mg | ORAL_CAPSULE | Freq: Two times a day (BID) | ORAL | 0 refills | Status: AC

## 2020-03-01 NOTE — Discharge Instructions (Addendum)
Wound Care Keep incision covered and dry for two days.    Do not put any creams, lotions, or ointments on incision. Leave steri-strips on neck.  They will fall off by themselves. Activity Walk each and every day, increasing distance each day. No lifting greater than 5 lbs.  Avoid excessive neck motion. No driving for 2 weeks; may ride as a passenger locally. Diet Resume your normal diet.   Call Your Doctor If Any of These Occur Redness, drainage, or swelling at the wound.  Temperature greater than 101 degrees. Severe pain not relieved by pain medication. Incision starts to come apart. Follow Up Appt Call today for appointment in 1-2 weeks [phone]) or for problems.  If you have any hardware placed in your spine, you will need an x-ray before your appointment.

## 2020-03-01 NOTE — Evaluation (Signed)
Occupational Therapy Evaluation Patient Details Name: Valerie Duran MRN: [id number] DOB: 10-06-1945 Today's Date: 03/01/2020    History of Present Illness Pt is a 74 y/o female s/p C4-5 ACDF. PMH includes arthritis, DM, MVP, s/p bilateral TKA, s/p bilateral THA, s/p back surgery.    Clinical Impression   PTA, pt was living with her husband and was independent. Currently, pt performing ADLs and functional mobility using RW at [place] level. Provided education and handout on cervical precautions, brace management, bed mobility, UB ADLs, LB ADLs, toileting, and shower transfer; pt demonstrated understanding. Answered all pt questions. Recommend dc home once medically stable per physician. All acute OT needs met and will sign off. Thank you.    Follow Up Recommendations  No OT follow up    Equipment Recommendations  None recommended by OT    Recommendations for Other Services       Precautions / Restrictions Precautions Precautions: Cervical Precaution Booklet Issued: Yes (comment) Precaution Comments: Reviewed cervical precautions with pt.  Required Braces or Orthoses: Cervical Brace Cervical Brace: Hard collar      Mobility Bed Mobility Overal bed mobility: Needs Assistance Bed Mobility: Rolling;Sidelying to Sit Rolling: Supervision Sidelying to sit: Supervision       General bed mobility comments: Supervision for safety  Transfers Overall transfer level: Modified independent               General transfer comment: Use of RW for balance and comfort    Balance Overall balance assessment: Needs assistance Sitting-balance support: No upper extremity supported;Feet supported Sitting balance-Leahy Scale: Fair     Standing balance support: During functional activity;No upper extremity supported Standing balance-Leahy Scale: Fair                             ADL either performed or assessed with clinical judgement   ADL Overall ADL's :  Modified independent                                       General ADL Comments: Providing education and handout on cervical precautions, brace management, UB ADLs, LB ADLs, grooming, bed mobility, and functional transfers.      Vision         Perception     Praxis      Pertinent Vitals/Pain Pain Assessment: No/denies pain     Hand Dominance Right   Extremity/Trunk Assessment Upper Extremity Assessment Upper Extremity Assessment: Overall WFL for tasks assessed   Lower Extremity Assessment Lower Extremity Assessment: Defer to PT evaluation   Cervical / Trunk Assessment Cervical / Trunk Assessment: Other exceptions Cervical / Trunk Exceptions: s/p ACDF   Communication Communication Communication: No difficulties   Cognition Arousal/Alertness: Awake/alert Behavior During Therapy: WFL for tasks assessed/performed Overall Cognitive Status: Within Functional Limits for tasks assessed                                     General Comments       Exercises     Shoulder Instructions      Home Living Family/patient expects to be discharged to:: Private residence Living Arrangements: Spouse/significant other Available Help at Discharge: Family Type of Home: House Home Access: Level entry     Home Layout: One level  Bathroom Shower/Tub: Occupational psychologist: Handicapped height     Home Equipment: Environmental consultant - 2 wheels;Walker - 4 wheels;Shower seat (has upright walker)          Prior Functioning/Environment Level of Independence: Independent with assistive device(s)        Comments: Reports she uses her upright walker for mobility tasks outside of home. Sometimes does not use when inside of home.         OT Problem List: Decreased activity tolerance;Decreased knowledge of use of DME or AE;Decreased knowledge of precautions      OT Treatment/Interventions:      OT Goals(Current goals can be found in the care  plan section) Acute Rehab OT Goals Patient Stated Goal: to go home OT Goal Formulation: All assessment and education complete, DC therapy  OT Frequency:     Barriers to D/C:            Co-evaluation              AM-PAC OT "6 Clicks" Daily Activity     Outcome Measure Help from another person eating meals?: None Help from another person taking care of personal grooming?: None Help from another person toileting, which includes using toliet, bedpan, or urinal?: None Help from another person bathing (including washing, rinsing, drying)?: A Little Help from another person to put on and taking off regular upper body clothing?: None Help from another person to put on and taking off regular lower body clothing?: A Little 6 Click Score: 22   End of Session Equipment Utilized During Treatment: Cervical collar;Rolling walker Nurse Communication: Mobility status  Activity Tolerance: Patient tolerated treatment well Patient left: in chair;with call bell/phone within reach  OT Visit Diagnosis: Unsteadiness on feet (R26.81);Other abnormalities of gait and mobility (R26.89);Muscle weakness (generalized) (M62.81)                Time: 1287-8676 OT Time Calculation (min): 25 min Charges:  OT General Charges $OT Visit: 1 Visit OT Evaluation $OT Eval Low Complexity: 1 Low OT Treatments $Self Care/Home Management : 8-22 mins  Treyce Spillers MSOT, OTR/L Acute Rehab Pager: [phone] Office: [place] 03/01/2020, 9:06 AM

## 2020-03-01 NOTE — Progress Notes (Signed)
Physical Therapy Treatment Patient Details Name: Valerie Duran MRN: [id number] DOB: 1945/12/11 Today's Date: 03/01/2020    History of Present Illness Pt is a 74 y/o female s/p C4-5 ACDF. PMH includes arthritis, DM, MVP, s/p bilateral TKA, s/p bilateral THA, s/p back surgery.     PT Comments    Pt progressing towards physical therapy goals. Was able to perform transfers and ambulation with up to supervision for safety and RW for comfort and support (mainly due to fear of falling). Reviewed and reinforced education on precautions, car transfer, brace wearing schedule, and appropriate activity progression. Will continue to follow and progress as able per POC.     Follow Up Recommendations  No PT follow up;Supervision for mobility/OOB     Equipment Recommendations  None recommended by PT    Recommendations for Other Services       Precautions / Restrictions Precautions Precautions: Cervical Precaution Booklet Issued: Yes (comment) Precaution Comments: fear of falling; Reviewed verbally during functional mobility Required Braces or Orthoses: Cervical Brace Cervical Brace: Hard collar Restrictions Weight Bearing Restrictions: No    Mobility  Bed Mobility Overal bed mobility: Needs Assistance Bed Mobility: Rolling;Sidelying to Sit Rolling: Supervision Sidelying to sit: Supervision       General bed mobility comments: Pt was received sitting up in the recliner.   Transfers Overall transfer level: Modified independent Equipment used: Rolling walker (2 wheeled) Transfers: Sit to/from Stand           General transfer comment: VC's for hand placement on seated surface for safety. Pt was able to power-up to full stand well but used RW for comfort due to fear of falling.   Ambulation/Gait Ambulation/Gait assistance: Supervision Gait Distance (Feet): 300 Feet Assistive device: Rolling walker (2 wheeled) Gait Pattern/deviations: Step-through pattern;Decreased stride  length Gait velocity: Decreased Gait velocity interpretation: <1.31 ft/sec, indicative of household ambulator General Gait Details: Slow and cautious due to fear of falling but overall steady with RW for support. VC's for improved posture, closer walker proximity and forward gaze.    Stairs             Wheelchair Mobility    Modified Rankin (Stroke Patients Only)       Balance Overall balance assessment: Needs assistance Sitting-balance support: No upper extremity supported;Feet supported Sitting balance-Leahy Scale: Fair     Standing balance support: During functional activity;No upper extremity supported Standing balance-Leahy Scale: Fair Standing balance comment: Reliant on BUE support                             Cognition Arousal/Alertness: Awake/alert Behavior During Therapy: WFL for tasks assessed/performed Overall Cognitive Status: Within Functional Limits for tasks assessed                                        Exercises      General Comments        Pertinent Vitals/Pain Pain Assessment: No/denies pain Faces Pain Scale: Hurts even more Pain Location: neck  Pain Descriptors / Indicators: Aching;Operative site guarding Pain Intervention(s): Limited activity within patient's tolerance;Monitored during session;Repositioned    Home Living Family/patient expects to be discharged to:: Private residence Living Arrangements: Spouse/significant other Available Help at Discharge: Family Type of Home: House Home Access: Level entry   Home Layout: One level Home Equipment: Environmental consultant - 2 wheels;Walker - 4 wheels;Shower  seat (has upright walker)      Prior Function Level of Independence: Independent with assistive device(s)      Comments: Reports she uses her upright walker for mobility tasks outside of home. Sometimes does not use when inside of home.    PT Goals (current goals can now be found in the care plan section) Acute  Rehab PT Goals Patient Stated Goal: to go home PT Goal Formulation: With patient Time For Goal Achievement: 03/14/20 Potential to Achieve Goals: Good Progress towards PT goals: Progressing toward goals    Frequency    Min 5X/week      PT Plan Current plan remains appropriate    Co-evaluation              AM-PAC PT "6 Clicks" Mobility   Outcome Measure  Help needed turning from your back to your side while in a flat bed without using bedrails?: A Little Help needed moving from lying on your back to sitting on the side of a flat bed without using bedrails?: A Little Help needed moving to and from a bed to a chair (including a wheelchair)?: A Little Help needed standing up from a chair using your arms (e.g., wheelchair or bedside chair)?: A Little Help needed to walk in hospital room?: A Little Help needed climbing 3-5 steps with a railing? : A Little 6 Click Score: 18    End of Session Equipment Utilized During Treatment: Gait belt;Cervical collar Activity Tolerance: Patient tolerated treatment well Patient left: in chair;with call bell/phone within reach Nurse Communication: Mobility status PT Visit Diagnosis: Unsteadiness on feet (R26.81);Muscle weakness (generalized) (M62.81)     Time: 4356-8616 PT Time Calculation (min) (ACUTE ONLY): 22 min  Charges:  $Gait Training: 8-22 mins                     Rolinda Roan, PT, DPT Acute Rehabilitation Services Pager: [phone] Office: [phone]    Thelma Comp 03/01/2020, 10:12 AM

## 2020-03-01 NOTE — Discharge Summary (Signed)
Physician Discharge Summary     Providing Compassionate, Quality Care - Together   Patient ID: Valerie Duran MRN: [id number] DOB/AGE: 02-21-1946 74 y.o.  Admit date: 02/29/2020 Discharge date: 03/01/2020  Admission Diagnoses: Acquired spondylolisthesis of cervical vertebra  Discharge Diagnoses:  Active Problems:   Acquired spondylolisthesis of cervical vertebra   Discharged Condition: good  Hospital Course: Patient was admitted to [facility] following C4-5 ACDF by Dr. Arnoldo Morale on 9/0/2021. The patient is tolerating food well. She is having no issues with swallowing or complaints of shortness of breath. Her pain is well-controlled with oral pain medication. Both physical and occupational therapies have worked with the patient. She is ready for discharge home.  Consults: rehabilitation medicine  Significant Diagnostic Studies: radiology: DG Cervical Spine 1 View  Result Date: 02/29/2020 CLINICAL DATA:  Cervical fusion EXAM: DG CERVICAL SPINE - 1 VIEW COMPARISON:  05/23/2019 FINDINGS: Single lateral view of the cervical spine was obtained and reveals new interbody fusion at C4-5 with anterior fixation. Previously seen fixation plate has been removed. Changes of prior fusion at C5-6 are noted. IMPRESSION: C4-5 fusion Electronically Signed   By: Inez Catalina M.D.   On: 02/29/2020 12:09     Treatments: surgery: C4-5 anterior cervical discectomy/decompression; C4-5 interbody arthrodesis with local morcellized autograft bone and Zimmer DBM; insertion of interbody prosthesis at C4-5 (DePuy titanium interbody prosthesis); anterior cervical plating from C4-5 with DePuy titanium plate  Discharge Exam: Blood pressure 132/67, pulse 70, temperature (!) 97.5 F (36.4 C), temperature source Oral, resp. rate 18, height 5' 8"  (1.727 m), weight 87.1 kg, SpO2 99 %.  Alert and oriented x 4 PERRLA CN II-XII grossly intact MAE, Strength and sensation intact Incision is covered with Honeycomb dressing and  Steri Strips; Dressing is dry and intact with small amount of sanguinous drainage Drain removed this morning  Disposition: Discharge disposition: 01-Home or Self Care        Allergies as of 03/01/2020      Reactions   Ambien [zolpidem Tartrate] Other (See Comments)   Altered mental status   Barley Grass Other (See Comments)   Celiac disease   Wheat Bran Rash   Celiac disease   Tape Other (See Comments)   Adhesive tape - tears skin   Codeine Nausea Only   Ibuprofen Other (See Comments), Rash   GI upset " makes my stomach hurt"   Simvastatin Rash   Valium [diazepam] Other (See Comments)   Sleep walks      Medication List    TAKE these medications   acetaminophen 650 MG CR tablet Commonly known as: TYLENOL Take 650-1,300 mg by mouth every 8 (eight) hours as needed for pain.   amoxicillin 500 MG capsule Commonly known as: AMOXIL Take 2,000 mg by mouth See admin instructions. Take 4 capsules (2000 mg) by mouth 1 hour prior to dental appointments   Biotin 5000 MCG Caps Take 5,000 mcg by mouth daily.   citalopram 20 MG tablet Commonly known as: CELEXA Take 20 mg by mouth every evening.   cyclobenzaprine 10 MG tablet Commonly known as: FLEXERIL Take 1 tablet (10 mg total) by mouth 3 (three) times daily as needed for muscle spasms.   docusate sodium 100 MG capsule Commonly known as: COLACE Take 1 capsule (100 mg total) by mouth 2 (two) times daily.   fexofenadine 180 MG tablet Commonly known as: ALLEGRA Take 180 mg by mouth daily as needed (allergies.).   fluticasone 50 MCG/ACT nasal spray Commonly known as: [place]  1-2 sprays into both nostrils daily as needed (allergies/congestion.).   furosemide 20 MG tablet Commonly known as: LASIX Take 20 mg by mouth daily.   Glucosamine Sulfate 1000 MG Caps Take 2,000 mg by mouth daily.   levothyroxine 75 MCG tablet Commonly known as: SYNTHROID Take 75 mcg by mouth daily before breakfast.   Lubricant Eye  Drops 0.4-0.3 % Soln Generic drug: Polyethyl Glycol-Propyl Glycol Place 1 drop into both eyes 3 (three) times daily as needed (dry/irritated eyes.).   Mag-200 200 MG Tabs Generic drug: Magnesium Oxide Take 200 mg by mouth daily.   metFORMIN 500 MG 24 hr tablet Commonly known as: GLUCOPHAGE-XR Take 500 mg by mouth daily with breakfast.   Oxycodone HCl 10 MG Tabs Take 1 tablet (10 mg total) by mouth every 4 (four) hours as needed for severe pain ((score 7 to 10)).   pramipexole 1 MG tablet Commonly known as: MIRAPEX Take 1 mg by mouth 2 (two) times daily.       Follow-up Information    Newman Pies, MD. Schedule an appointment as soon as possible for a visit in 2 week(s).   Specialty: Neurosurgery Contact information: [address] [place] [address] [phone]               Signed: Patricia Nettle 03/01/2020, 8:04 AM

## 2020-03-01 NOTE — Progress Notes (Signed)
Patient alert and oriented, mae's well, voiding adequate amount of urine, swallowing without difficulty, no c/o pain at time of discharge. Patient discharged home with family. Script and discharged instructions given to patient. Patient and family stated understanding of instructions given. Patient has an appointment with Dr. Arnoldo Morale

## 2020-03-04 ENCOUNTER — Encounter (HOSPITAL_COMMUNITY): Payer: Self-pay | Admitting: Neurosurgery

## 2020-07-13 IMAGING — MG MM DIGITAL SCREENING BILAT W/ TOMO W/ CAD
8 series · 8 of 24 positions shown · non-contrast
Comparison: Previous exam(s).

CLINICAL DATA: Screening.

EXAM:
DIGITAL SCREENING BILATERAL MAMMOGRAM WITH TOMO AND CAD

[L CC synth-2D]
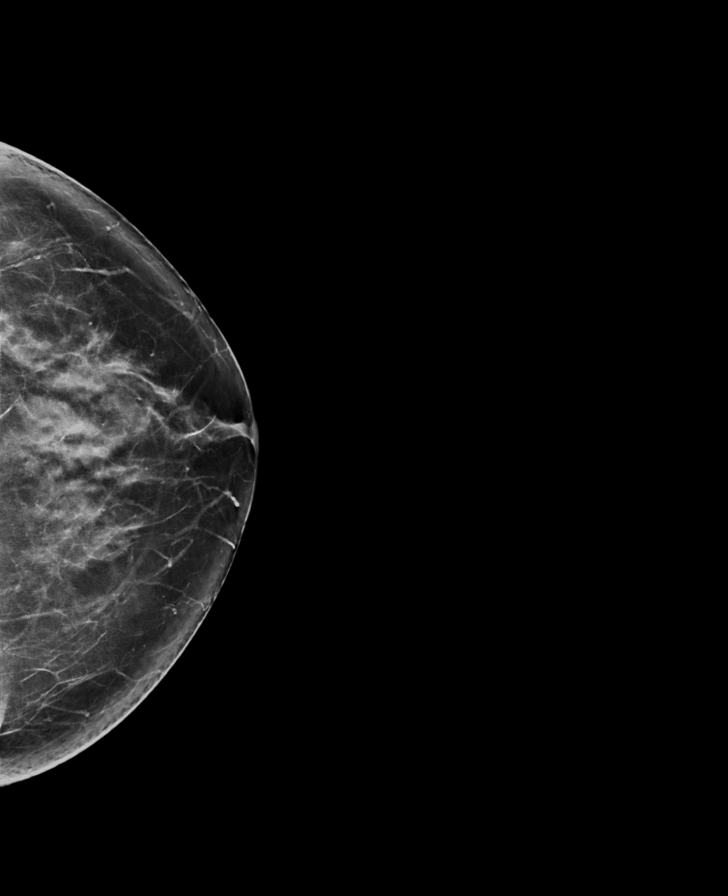

[R CC synth-2D]
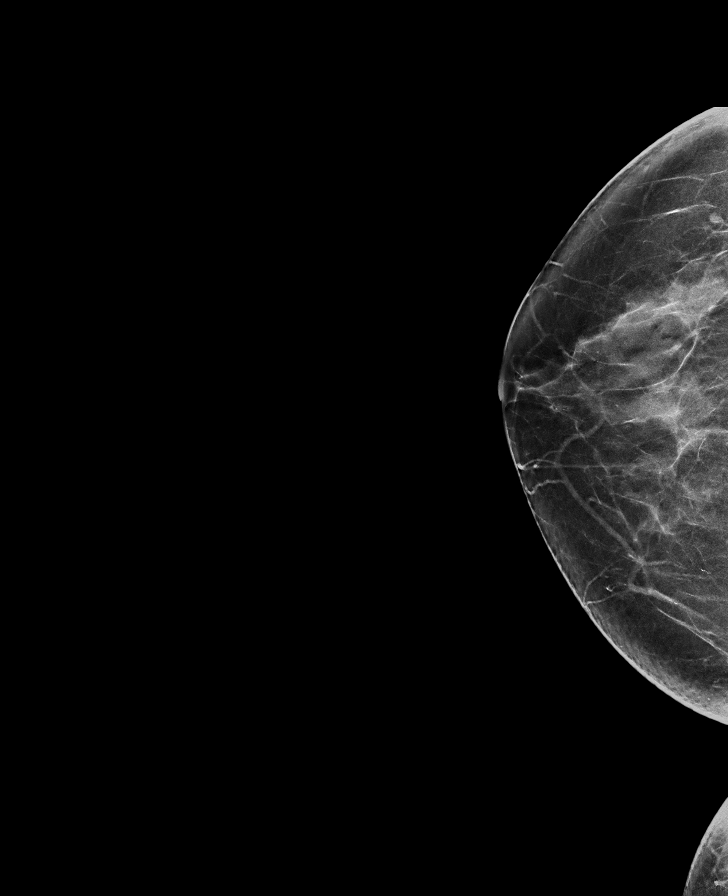

[L MLO synth-2D]
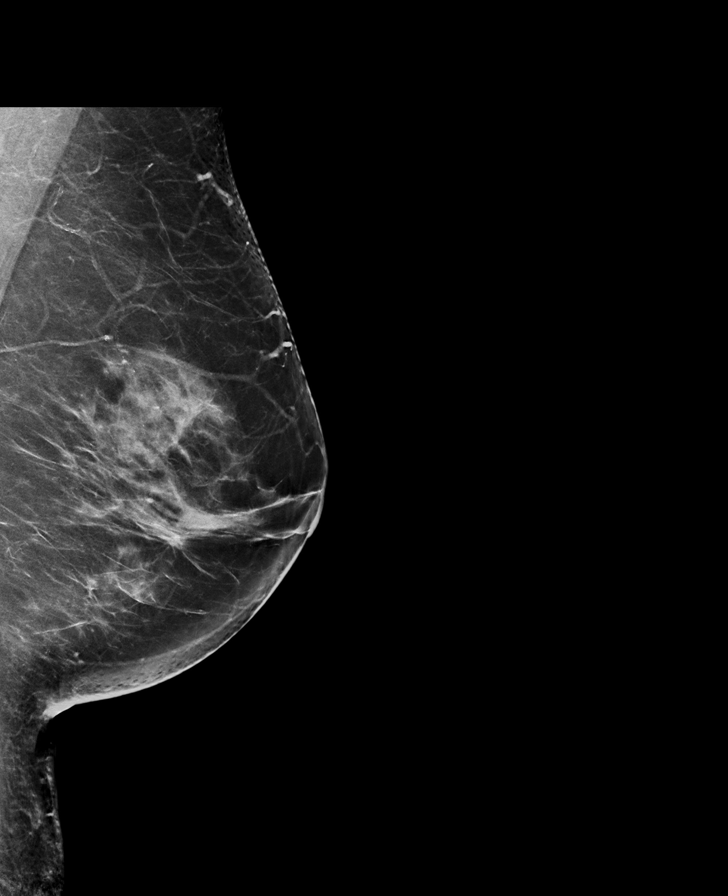

[R MLO synth-2D]
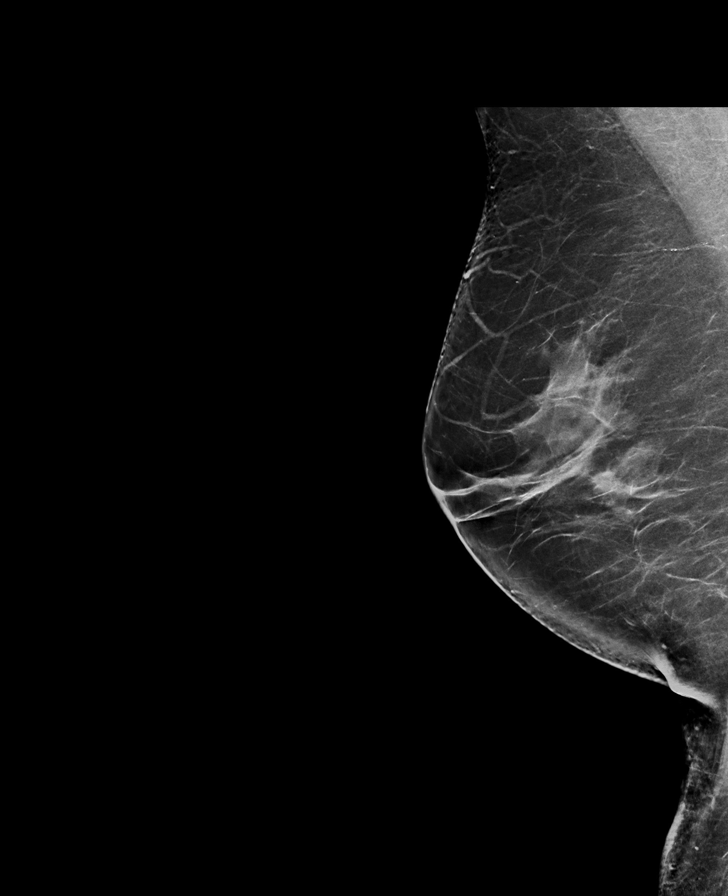

[R MLO tomo · tomo slice 41/81.0]
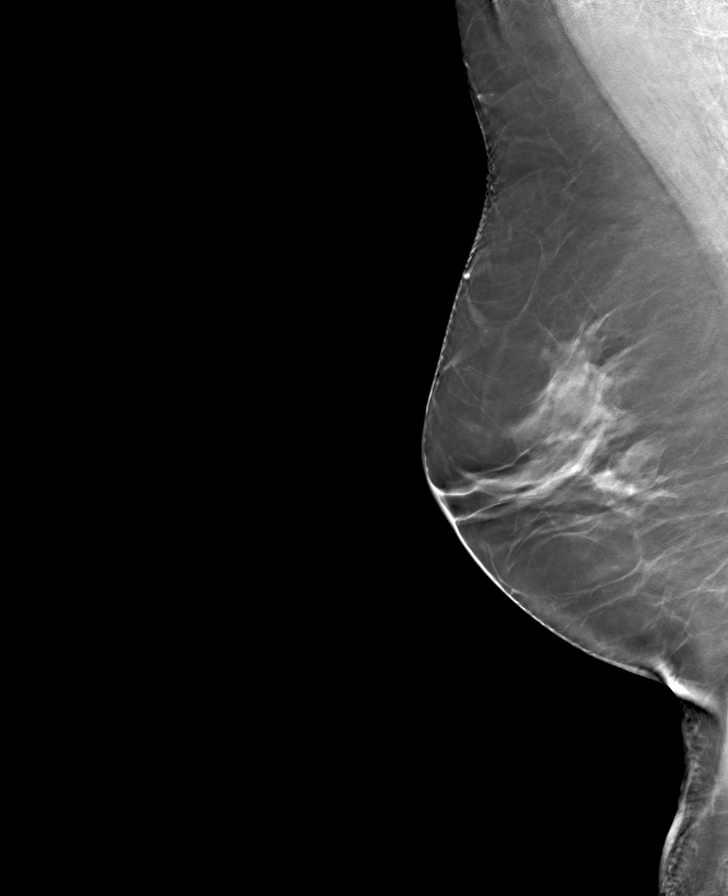

[L CC tomo · tomo slice 37/74.0]
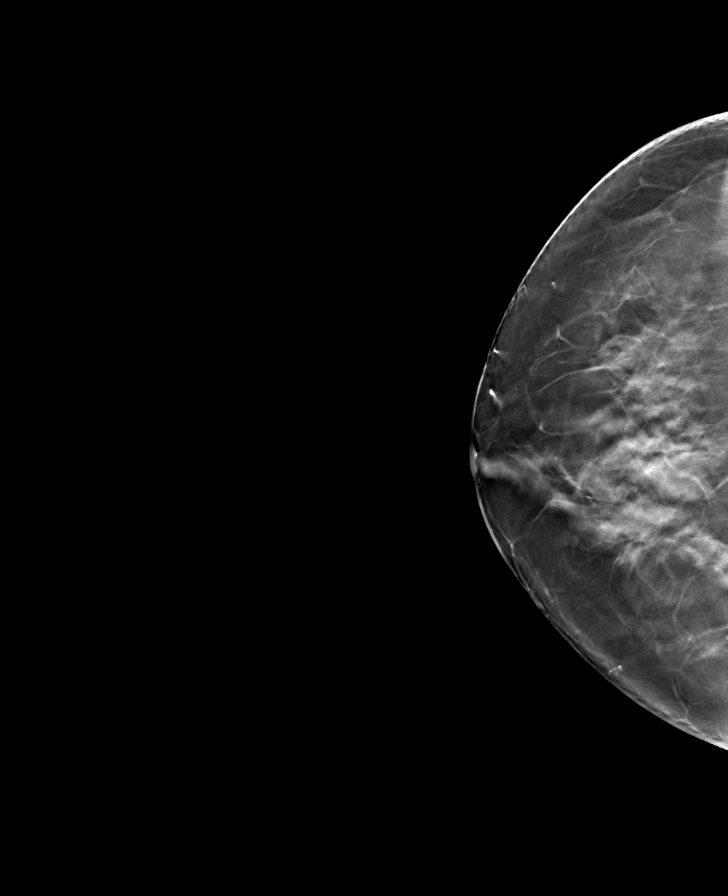

[R CC tomo · tomo slice 36/71.0]
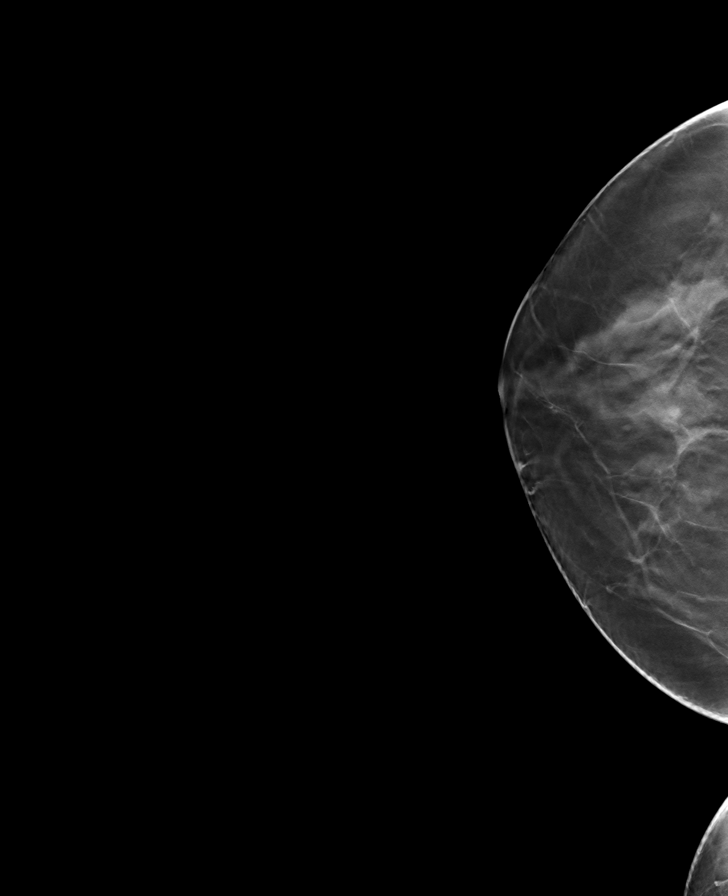

[L MLO tomo · tomo slice 43/85.0]
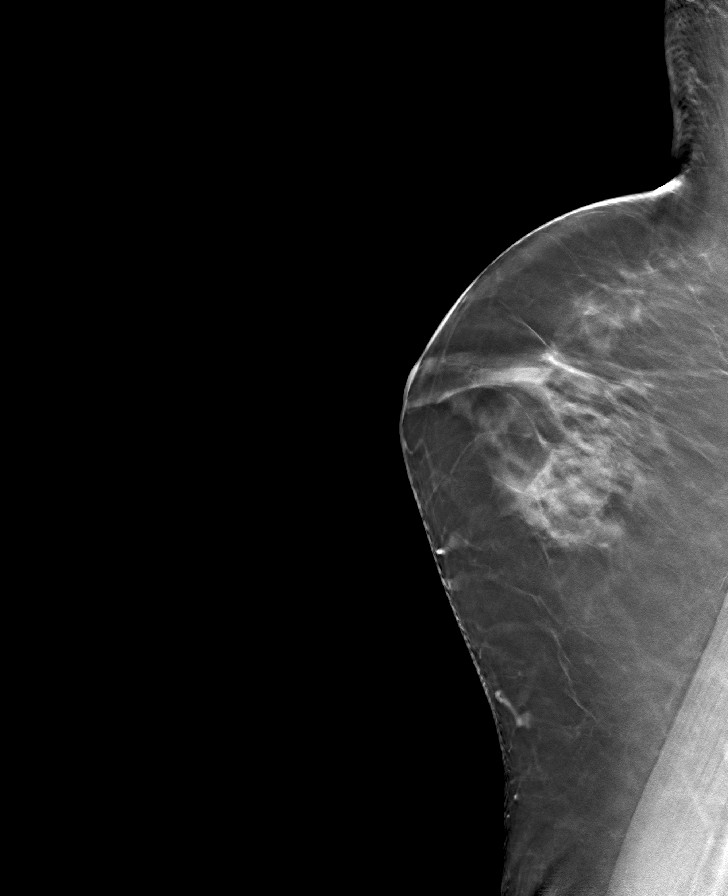

[8 of 24 positions shown; findings below may reference images not displayed]

ACR Breast Density Category c: The breast tissue is heterogeneously
dense, which may obscure small masses.
FINDINGS: In the right breast, a possible asymmetry warrants further
evaluation. This possible asymmetry is seen within the lower central
RIGHT breast, MLO view only, tomosynthesis slice 43.

In the left breast, no findings suspicious for malignancy. Images
were processed with CAD.
IMPRESSION: Further evaluation is suggested for possible asymmetry in the right
breast.

RECOMMENDATION:
Diagnostic mammogram and possibly ultrasound of the right breast.
(Code:[id number]-G-PPM)

The patient will be contacted regarding the findings, and additional
imaging will be scheduled.

BI-RADS CATEGORY  0: Incomplete. Need additional imaging evaluation
and/or prior mammograms for comparison.

## 2021-03-21 ENCOUNTER — Other Ambulatory Visit: Payer: Self-pay | Admitting: Neurosurgery

## 2021-03-21 DIAGNOSIS — M542 Cervicalgia: Secondary | ICD-10-CM

## 2021-04-05 ENCOUNTER — Other Ambulatory Visit: Payer: Self-pay

## 2021-04-05 ENCOUNTER — Ambulatory Visit
Admission: RE | Admit: 2021-04-05 | Discharge: 2021-04-05 | Disposition: A | Discharge status: Home / Self Care | Payer: Medicare Other | Source: Ambulatory Visit | Attending: Neurosurgery | Admitting: Neurosurgery

## 2021-04-05 DIAGNOSIS — M542 Cervicalgia: Secondary | ICD-10-CM

## 2023-06-25 ENCOUNTER — Encounter: Payer: Self-pay | Admitting: Gastroenterology

## 2023-07-08 ENCOUNTER — Encounter: Payer: Self-pay | Admitting: Hematology and Oncology

## 2023-07-15 ENCOUNTER — Ambulatory Visit: Payer: Medicare Other | Admitting: Anesthesiology

## 2023-07-15 ENCOUNTER — Ambulatory Visit
Admission: RE | Admit: 2023-07-15 | Discharge: 2023-07-15 | Disposition: A | Discharge status: Home / Self Care | Payer: Medicare Other | Source: Ambulatory Visit | Attending: Gastroenterology | Admitting: Gastroenterology

## 2023-07-15 ENCOUNTER — Encounter: Payer: Self-pay | Admitting: Gastroenterology

## 2023-07-15 ENCOUNTER — Encounter
Admission: RE | Disposition: A | Discharge status: Home / Self Care | Payer: Self-pay | Source: Ambulatory Visit | Attending: Gastroenterology

## 2023-07-15 DIAGNOSIS — F32A Depression, unspecified: Secondary | ICD-10-CM | POA: Diagnosis not present

## 2023-07-15 DIAGNOSIS — Z800 Family history of malignant neoplasm of digestive organs: Secondary | ICD-10-CM | POA: Diagnosis not present

## 2023-07-15 DIAGNOSIS — Z90710 Acquired absence of both cervix and uterus: Secondary | ICD-10-CM | POA: Diagnosis not present

## 2023-07-15 DIAGNOSIS — I252 Old myocardial infarction: Secondary | ICD-10-CM | POA: Insufficient documentation

## 2023-07-15 DIAGNOSIS — Z1211 Encounter for screening for malignant neoplasm of colon: Secondary | ICD-10-CM | POA: Diagnosis present

## 2023-07-15 DIAGNOSIS — I341 Nonrheumatic mitral (valve) prolapse: Secondary | ICD-10-CM | POA: Insufficient documentation

## 2023-07-15 DIAGNOSIS — Z96641 Presence of right artificial hip joint: Secondary | ICD-10-CM | POA: Insufficient documentation

## 2023-07-15 DIAGNOSIS — K5730 Diverticulosis of large intestine without perforation or abscess without bleeding: Secondary | ICD-10-CM | POA: Diagnosis not present

## 2023-07-15 DIAGNOSIS — E039 Hypothyroidism, unspecified: Secondary | ICD-10-CM | POA: Insufficient documentation

## 2023-07-15 DIAGNOSIS — R7303 Prediabetes: Secondary | ICD-10-CM | POA: Insufficient documentation

## 2023-07-15 DIAGNOSIS — Z7984 Long term (current) use of oral hypoglycemic drugs: Secondary | ICD-10-CM | POA: Diagnosis not present

## 2023-07-15 DIAGNOSIS — D123 Benign neoplasm of transverse colon: Secondary | ICD-10-CM | POA: Diagnosis not present

## 2023-07-15 HISTORY — DX: Other fatigue: R53.83

## 2023-07-15 HISTORY — PX: POLYPECTOMY: SHX5525

## 2023-07-15 HISTORY — DX: Prediabetes: R73.03

## 2023-07-15 HISTORY — PX: COLONOSCOPY WITH PROPOFOL: SHX5780

## 2023-07-15 SURGERY — COLONOSCOPY WITH PROPOFOL
Anesthesia: General

## 2023-07-15 MED ORDER — SODIUM CHLORIDE 0.9 % IV SOLN: INTRAVENOUS | Status: DC

## 2023-07-15 MED ORDER — LIDOCAINE HCL (PF) 2 % IJ SOLN
INTRAMUSCULAR | Status: AC
  Filled 2023-07-15: qty 5

## 2023-07-15 MED ORDER — PROPOFOL 500 MG/50ML IV EMUL
INTRAVENOUS | Status: DC | PRN
  Administered 2023-07-15: 75 ug/kg/min via INTRAVENOUS

## 2023-07-15 MED ORDER — DEXMEDETOMIDINE HCL IN NACL 80 MCG/20ML IV SOLN
INTRAVENOUS | Status: DC | PRN
  Administered 2023-07-15: 8 ug via INTRAVENOUS
  Administered 2023-07-15: 12 ug via INTRAVENOUS

## 2023-07-15 MED ORDER — LIDOCAINE HCL (CARDIAC) PF 100 MG/5ML IV SOSY
PREFILLED_SYRINGE | INTRAVENOUS | Status: DC | PRN
  Administered 2023-07-15: 60 mg via INTRAVENOUS

## 2023-07-15 MED ORDER — EPHEDRINE SULFATE (PRESSORS) 50 MG/ML IJ SOLN
INTRAMUSCULAR | Status: DC | PRN
  Administered 2023-07-15: 10 mg via INTRAVENOUS

## 2023-07-15 MED ORDER — PROPOFOL 10 MG/ML IV BOLUS
INTRAVENOUS | Status: DC | PRN
  Administered 2023-07-15: 50 mg via INTRAVENOUS

## 2023-07-15 NOTE — Op Note (Addendum)
[facility] Gastroenterology Patient Name: Valerie Duran Procedure Date: 07/15/2023 10:01 AM MRN: [id number] Account #: [id number] Date of Birth: 1946/05/04 Admit Type: Outpatient Age: 78 Room: [facility] ENDO ROOM 1 Gender: Female Note Status: Supervisor Override Instrument Name: Prentice Docker [id number] Procedure:             Colonoscopy Indications:           High risk colon cancer surveillance: Personal history                         of colonic polyps, Family history of colon cancer in a                         first-degree relative before age 62 years Providers:             Trenda Moots, DO Referring MD:          Hassell Halim MD (Referring MD) Medicines:             Monitored Anesthesia Care Complications:         No immediate complications. Estimated blood loss:                         Minimal. Procedure:             Pre-Anesthesia Assessment:                        - Prior to the procedure, a History and Physical was                         performed, and patient medications and allergies were                         reviewed. The patient is competent. The risks and                         benefits of the procedure and the sedation options and                         risks were discussed with the patient. All questions                         were answered and informed consent was obtained.                         Patient identification and proposed procedure were                         verified by the physician, the nurse, the anesthetist                         and the technician in the endoscopy suite. Mental                         Status Examination: alert and oriented. Airway                         Examination: normal oropharyngeal airway and neck  mobility. Respiratory Examination: clear to                         auscultation. CV Examination: RRR, no murmurs, no S3                         or S4. Prophylactic  Antibiotics: The patient does not                         require prophylactic antibiotics. Prior                         Anticoagulants: The patient has taken no anticoagulant                         or antiplatelet agents. ASA Grade Assessment: II - A                         patient with mild systemic disease. After reviewing                         the risks and benefits, the patient was deemed in                         satisfactory condition to undergo the procedure. The                         anesthesia plan was to use monitored anesthesia care                         (MAC). Immediately prior to administration of                         medications, the patient was re-assessed for adequacy                         to receive sedatives. The heart rate, respiratory                         rate, oxygen saturations, blood pressure, adequacy of                         pulmonary ventilation, and response to care were                         monitored throughout the procedure. The physical                         status of the patient was re-assessed after the                         procedure.                        After obtaining informed consent, the colonoscope was                         passed under direct vision. Throughout the procedure,  the patient's blood pressure, pulse, and oxygen                         saturations were monitored continuously. The                         Colonoscope was introduced through the anus and                         advanced to the the terminal ileum, with                         identification of the appendiceal orifice and IC                         valve. The colonoscopy was performed without                         difficulty. The patient tolerated the procedure well.                         The quality of the bowel preparation was evaluated                         using the BBPS [facility] Bowel Preparation Scale) with                          scores of: Right Colon = 3, Transverse Colon = 3 and                         Left Colon = 3 (entire mucosa seen well with no                         residual staining, small fragments of stool or opaque                         liquid). The total BBPS score equals 9. The terminal                         ileum, ileocecal valve, appendiceal orifice, and                         rectum were photographed. Findings:      The perianal and digital rectal examinations were normal. Pertinent       negatives include normal sphincter tone.      The terminal ileum appeared normal. Estimated blood loss: none.      Two sessile polyps were found in the transverse colon and hepatic       flexure. The polyps were 1 to 2 mm in size. These polyps were removed       with a jumbo cold forceps. Resection and retrieval were complete.       Estimated blood loss: none. Estimated blood loss was minimal.      Scattered small-mouthed diverticula were found in the entire colon.       Estimated blood loss: none.      The exam was otherwise without abnormality on direct and retroflexion       views. Impression:            -  The examined portion of the ileum was normal.                        - Two 1 to 2 mm polyps in the transverse colon and at                         the hepatic flexure, removed with a jumbo cold                         forceps. Resected and retrieved.                        - Diverticulosis in the entire examined colon.                        - The examination was otherwise normal on direct and                         retroflexion views. Recommendation:        - Patient has a contact number available for                         emergencies. The signs and symptoms of potential                         delayed complications were discussed with the patient.                         Return to normal activities tomorrow. Written                         discharge instructions were provided to  the patient.                        - Discharge patient to home.                        - Resume previous diet.                        - Continue present medications.                        - Await pathology results.                        - No further surveillance colonoscopy due to advanced                         age at next due.                        - The findings and recommendations were discussed with                         the patient. Procedure Code(s):     --- Professional ---                        [phone], Colonoscopy, flexible; with biopsy, single or  multiple Diagnosis Code(s):     --- Professional ---                        Z86.010, Personal history of colonic polyps                        D12.3, Benign neoplasm of transverse colon (hepatic                         flexure or splenic flexure)                        K57.30, Diverticulosis of large intestine without                         perforation or abscess without bleeding CPT copyright 2022 American Medical Association. All rights reserved. The codes documented in this report are preliminary and upon coder review may  be revised to meet current compliance requirements. Attending Participation:      I personally performed the entire procedure. Elfredia Nevins, DO Jaynie Collins DO, DO 07/15/2023 10:52:03 AM This report has been signed electronically. Number of Addenda: 0 Note Initiated On: 07/15/2023 10:01 AM Scope Withdrawal Time: 0 hours 13 minutes 24 seconds  Total Procedure Duration: 0 hours 24 minutes 3 seconds  Estimated Blood Loss:  Estimated blood loss was minimal.      [facility]

## 2023-07-15 NOTE — Interval H&P Note (Signed)
History and Physical Interval Note: Preprocedure H&P from 07/15/23  was reviewed and there was no interval change after seeing and examining the patient.  Written consent was obtained from the patient after discussion of risks, benefits, and alternatives. Patient has consented to proceed with Colonoscopy with possible intervention   07/15/2023 10:14 AM  Valerie Duran  has presented today for surgery, with the diagnosis of Z86.0101 (ICD-10-CM) - History of adenomatous polyp of colon Z80.0 (ICD-10-CM) - FH: colon cancer.  The various methods of treatment have been discussed with the patient and family. After consideration of risks, benefits and other options for treatment, the patient has consented to  Procedure(s): COLONOSCOPY WITH PROPOFOL (N/A) as a surgical intervention.  The patient's history has been reviewed, patient examined, no change in status, stable for surgery.  I have reviewed the patient's chart and labs.  Questions were answered to the patient's satisfaction.     Valerie Duran

## 2023-07-15 NOTE — Transfer of Care (Signed)
Immediate Anesthesia Transfer of Care Note  Patient: Valerie Duran  Procedure(s) Performed: COLONOSCOPY WITH PROPOFOL POLYPECTOMY  Patient Location: PACU  Anesthesia Type:General  Level of Consciousness: sedated  Airway & Oxygen Therapy: Patient Spontanous Breathing  Post-op Assessment: Report given to RN and Post -op Vital signs reviewed and stable  Post vital signs: Reviewed and stable  Last Vitals:  Vitals Value Taken Time  BP 109/54 07/15/23 1051  Temp    Pulse 72 07/15/23 1053  Resp    SpO2 100 % 07/15/23 1053  Vitals shown include unfiled device data.  Last Pain:  Vitals:   07/15/23 0902  TempSrc: Temporal  PainSc: 1          Complications: No notable events documented.

## 2023-07-15 NOTE — H&P (Signed)
Pre-Procedure H&P   Patient ID: Valerie Duran is a 78 y.o. female.  Gastroenterology Provider: Jaynie Collins, DO  Referring Provider: Fransico Setters, NP PCP: Kandyce Rud, MD  Date: 07/15/2023  HPI Ms. Valerie Duran is a 78 y.o. female who presents today for Colonoscopy for Personal history of colon polyps, family history colon cancer .  BM q3d; no melena/hematochezia  CSY 01/2018- tax1, pan tics, ih Csy 06/2010 fair prep, no polyps; 2001 normal  R hip replacement  S/p hysterectomy and c section  Ferritin 16 sat 25% tibc 328  Father- crc 58 y/o   Past Medical History:  Diagnosis Date   Anemia    Arthritis    Back pain    Celiac disease    Depression    Fatigue    GERD (gastroesophageal reflux disease)    Heart murmur    Hypothyroidism    MVP (mitral valve prolapse)    Pneumonia    Pre-diabetes    RLS (restless legs syndrome)     Past Surgical History:  Procedure Laterality Date   ABDOMINAL HYSTERECTOMY     ANTERIOR CERVICAL DECOMP/DISCECTOMY FUSION N/A 02/29/2020   Procedure: ANTERIOR CERVICAL DECOMPRESSION/DISCECTOMY FUSION, INTERBODY PROSTHESIS, PLATE/SCREWS CERVICAL FOUR- CERVICAL FIVE; EXPLORE FUSION;RMOVE OLD PLATE;  Surgeon: Tressie Stalker, MD;  Location: [facility] OR;  Service: Neurosurgery;  Laterality: N/A;  ANTERIOR CERVICAL DECOMPRESSION/DISCECTOMY FUSION, INTERBODY PROSTHESIS, PLATE/SCREWS CERVICAL FOUR- CERVICAL FIVE; EXPLORE FUSION;RMOVE OLD PLATE   APPLICATION OF INTRAOPERATIVE CT SCAN N/A 07/06/2018   Procedure: APPLICATION OF INTRAOPERATIVE CT SCAN;  Surgeon: Tressie Stalker, MD;  Location: [facility] OR;  Service: Neurosurgery;  Laterality: N/A;   BACK SURGERY     spinal fusion   CATARACT EXTRACTION W/ INTRAOCULAR LENS  IMPLANT, BILATERAL Bilateral    CESAREAN SECTION N/A 1963 & 1965   COLONOSCOPY WITH PROPOFOL N/A 02/04/2018   Procedure: COLONOSCOPY WITH PROPOFOL;  Surgeon: Scot Jun, MD;  Location: [facility] ENDOSCOPY;  Service: Endoscopy;   Laterality: N/A;   ESOPHAGOGASTRODUODENOSCOPY (EGD) WITH PROPOFOL N/A 02/04/2018   Procedure: ESOPHAGOGASTRODUODENOSCOPY (EGD) WITH PROPOFOL;  Surgeon: Scot Jun, MD;  Location: [facility] ENDOSCOPY;  Service: Endoscopy;  Laterality: N/A;   EYE SURGERY     FOOT SURGERY Right 2007   FRACTURE SURGERY     HARDWARE REMOVAL Right 01/14/2015   Procedure: HARDWARE REMOVAL/ REMOVAL OF 3 CANNULATED SCREWS FROM RIGHT HIP.;  Surgeon: Donato Heinz, MD;  Location: [facility] ORS;  Service: Orthopedics;  Laterality: Right;   HIP FRACTURE SURGERY Right 2016   JOINT REPLACEMENT Bilateral 2014   knees   NECK SURGERY     fusion   TOTAL HIP ARTHROPLASTY Right 01/14/2015   Procedure: TOTAL HIP ARTHROPLASTY;  Surgeon: Donato Heinz, MD;  Location: [facility] ORS;  Service: Orthopedics;  Laterality: Right;    Family History Crc- father 38 y/o No other h/o GI disease or malignancy  Review of Systems  Constitutional:  Negative for activity change, appetite change, chills, diaphoresis, fatigue, fever and unexpected weight change.  HENT:  Negative for trouble swallowing and voice change.   Respiratory:  Negative for shortness of breath and wheezing.   Cardiovascular:  Negative for chest pain, palpitations and leg swelling.  Gastrointestinal:  Negative for abdominal distention, abdominal pain, anal bleeding, blood in stool, constipation, diarrhea, nausea, rectal pain and vomiting.  Musculoskeletal:  Negative for arthralgias and myalgias.  Skin:  Negative for color change and pallor.  Neurological:  Negative for dizziness, syncope and weakness.  Psychiatric/Behavioral:  Negative  for confusion.   All other systems reviewed and are negative.    Medications No current facility-administered medications on file prior to encounter.   Current Outpatient Medications on File Prior to Encounter  Medication Sig Dispense Refill   Biotin 5000 MCG CAPS Take 5,000 mcg by mouth daily.     citalopram (CELEXA) 20 MG tablet Take 20  mg by mouth every evening.      fexofenadine (ALLEGRA) 180 MG tablet Take 180 mg by mouth daily as needed (allergies.).      Glucosamine Sulfate 1000 MG CAPS Take 2,000 mg by mouth daily.     levothyroxine (SYNTHROID) 75 MCG tablet Take 75 mcg by mouth daily before breakfast.      Magnesium Oxide (MAG-200) 200 MG TABS Take 200 mg by mouth daily.     pramipexole (MIRAPEX) 1 MG tablet Take 1 mg by mouth 2 (two) times daily.      acetaminophen (TYLENOL) 650 MG CR tablet Take 650-1,300 mg by mouth every 8 (eight) hours as needed for pain.     amoxicillin (AMOXIL) 500 MG capsule Take 2,000 mg by mouth See admin instructions. Take 4 capsules (2000 mg) by mouth 1 hour prior to dental appointments     cyclobenzaprine (FLEXERIL) 10 MG tablet Take 1 tablet (10 mg total) by mouth 3 (three) times daily as needed for muscle spasms. 30 tablet 0   docusate sodium (COLACE) 100 MG capsule Take 1 capsule (100 mg total) by mouth 2 (two) times daily. 10 capsule 0   fluticasone (FLONASE) 50 MCG/ACT nasal spray Place 1-2 sprays into both nostrils daily as needed (allergies/congestion.).      furosemide (LASIX) 20 MG tablet Take 20 mg by mouth daily.     metFORMIN (GLUCOPHAGE-XR) 500 MG 24 hr tablet Take 500 mg by mouth daily with breakfast.      oxyCODONE 10 MG TABS Take 1 tablet (10 mg total) by mouth every 4 (four) hours as needed for severe pain ((score 7 to 10)). 30 tablet 0   Polyethyl Glycol-Propyl Glycol (LUBRICANT EYE DROPS) 0.4-0.3 % SOLN Place 1 drop into both eyes 3 (three) times daily as needed (dry/irritated eyes.).      Pertinent medications related to GI and procedure were reviewed by me with the patient prior to the procedure   Current Facility-Administered Medications:    0.9 %  sodium chloride infusion, , Intravenous, Continuous, Jaynie Collins, DO, Last Rate: 20 mL/hr at 07/15/23 0920, New Bag at 07/15/23 0920  sodium chloride 20 mL/hr at 07/15/23 0920       Allergies  Allergen  Reactions   Ambien [Zolpidem Tartrate] Other (See Comments)    Altered mental status   Barley Grass Other (See Comments)    Celiac disease   Wheat Rash    Celiac disease   Tape Other (See Comments)    Adhesive tape - tears skin   Codeine Nausea Only   Ibuprofen Other (See Comments) and Rash    GI upset " makes my stomach hurt"   Simvastatin Rash   Valium [Diazepam] Other (See Comments)    Sleep walks   Allergies were reviewed by me prior to the procedure  Objective   Body mass index is 27.02 kg/m. Vitals:   07/15/23 0902  BP: 139/72  Pulse: 80  Resp: 16  Temp: (!) 96.4 F (35.8 C)  TempSrc: Temporal  SpO2: 100%  Weight: 73.7 kg  Height: 5\' 5"  (1.651 m)     Physical Exam Vitals  and nursing note reviewed.  Constitutional:      General: She is not in acute distress.    Appearance: Normal appearance. She is not ill-appearing, toxic-appearing or diaphoretic.  HENT:     Head: Normocephalic and atraumatic.     Nose: Nose normal.     Mouth/Throat:     Mouth: Mucous membranes are moist.     Pharynx: Oropharynx is clear.  Eyes:     General: No scleral icterus.    Extraocular Movements: Extraocular movements intact.  Cardiovascular:     Rate and Rhythm: Normal rate and regular rhythm.     Heart sounds: Normal heart sounds. No murmur heard.    No friction rub. No gallop.  Pulmonary:     Effort: Pulmonary effort is normal. No respiratory distress.     Breath sounds: Normal breath sounds. No wheezing, rhonchi or rales.  Abdominal:     General: Bowel sounds are normal. There is no distension.     Palpations: Abdomen is soft.     Tenderness: There is no abdominal tenderness. There is no guarding or rebound.  Musculoskeletal:     Cervical back: Neck supple.     Right lower leg: No edema.     Left lower leg: No edema.  Skin:    General: Skin is warm and dry.     Coloration: Skin is not jaundiced or pale.  Neurological:     General: No focal deficit present.      Mental Status: She is alert and oriented to person, place, and time. Mental status is at baseline.  Psychiatric:        Mood and Affect: Mood normal.        Behavior: Behavior normal.        Thought Content: Thought content normal.        Judgment: Judgment normal.      Assessment:  Ms. Valerie Duran is a 78 y.o. female  who presents today for Colonoscopy for Personal history of colon polyps, family history colon cancer .  Plan:  Colonoscopy with possible intervention today  Colonoscopy with possible biopsy, control of bleeding, polypectomy, and interventions as necessary has been discussed with the patient/patient representative. Informed consent was obtained from the patient/patient representative after explaining the indication, nature, and risks of the procedure including but not limited to death, bleeding, perforation, missed neoplasm/lesions, cardiorespiratory compromise, and reaction to medications. Opportunity for questions was given and appropriate answers were provided. Patient/patient representative has verbalized understanding is amenable to undergoing the procedure.   Jaynie Collins, DO  [facility] Gastroenterology  Portions of the record may have been created with voice recognition software. Occasional wrong-word or 'sound-a-like' substitutions may have occurred due to the inherent limitations of voice recognition software.  Read the chart carefully and recognize, using context, where substitutions may have occurred.

## 2023-07-15 NOTE — Anesthesia Preprocedure Evaluation (Signed)
Anesthesia Evaluation  Patient identified by MRN, date of birth, ID band Patient awake    Reviewed: Allergy & Precautions, NPO status , Patient's Chart, lab work & pertinent test results  History of Anesthesia Complications Negative for: history of anesthetic complications  Airway Mallampati: III  TM Distance: <3 FB     Dental  (+) Dental Advidsory Given, Edentulous Upper, Edentulous Lower   Pulmonary neg pulmonary ROS   Pulmonary exam normal        Cardiovascular Exercise Tolerance: Good (-) hypertension(-) angina + Past MI  (-) Cardiac Stents Normal cardiovascular exam(-) dysrhythmias + Valvular Problems/Murmurs MVP      Neuro/Psych  PSYCHIATRIC DISORDERS  Depression    negative neurological ROS     GI/Hepatic Neg liver ROS,GERD  Medicated,,  Endo/Other  diabetes (borderline)Hypothyroidism    Renal/GU negative Renal ROS     Musculoskeletal  (+) Arthritis , Osteoarthritis,    Abdominal Normal abdominal exam  (+)   Peds negative pediatric ROS (+)  Hematology  (+) Blood dyscrasia, anemia   Anesthesia Other Findings Past Medical History: No date: Anemia No date: Arthritis No date: Back pain No date: Celiac disease No date: Depression No date: Diabetes mellitus without complication (HCC) No date: GERD (gastroesophageal reflux disease) No date: Heart murmur No date: MVP (mitral valve prolapse)  Reproductive/Obstetrics                             Anesthesia Physical Anesthesia Plan  ASA: 2  Anesthesia Plan: General   Post-op Pain Management:    Induction: Intravenous  PONV Risk Score and Plan: 3 and Propofol infusion and TIVA  Airway Management Planned: Nasal Cannula and Natural Airway  Additional Equipment:   Intra-op Plan:   Post-operative Plan:   Informed Consent: I have reviewed the patients History and Physical, chart, labs and discussed the procedure including the  risks, benefits and alternatives for the proposed anesthesia with the patient or authorized representative who has indicated his/her understanding and acceptance.     Dental advisory given  Plan Discussed with: CRNA  Anesthesia Plan Comments:         Anesthesia Quick Evaluation

## 2023-07-15 NOTE — Anesthesia Postprocedure Evaluation (Signed)
Anesthesia Post Note  Patient: Valerie Duran  Procedure(s) Performed: COLONOSCOPY WITH PROPOFOL POLYPECTOMY  Patient location during evaluation: Endoscopy Anesthesia Type: General Level of consciousness: awake and alert Pain management: pain level controlled Vital Signs Assessment: post-procedure vital signs reviewed and stable Respiratory status: spontaneous breathing, nonlabored ventilation, respiratory function stable and patient connected to nasal cannula oxygen Cardiovascular status: blood pressure returned to baseline and stable Postop Assessment: no apparent nausea or vomiting Anesthetic complications: no   No notable events documented.   Last Vitals:  Vitals:   07/15/23 1110 07/15/23 1111  BP:  (!) 119/57  Pulse: 76 78  Resp:    Temp:    SpO2: 100% 100%    Last Pain:  Vitals:   07/15/23 0902  TempSrc: Temporal  PainSc: 1                  Lenard Simmer

## 2023-07-16 LAB — SURGICAL PATHOLOGY

## 2024-06-17 ENCOUNTER — Observation Stay

## 2024-06-17 ENCOUNTER — Emergency Department

## 2024-06-17 ENCOUNTER — Observation Stay
Admission: EM | Admit: 2024-06-17 | Discharge: 2024-06-18 | Disposition: A | Attending: Emergency Medicine | Admitting: Emergency Medicine

## 2024-06-17 ENCOUNTER — Other Ambulatory Visit: Payer: Self-pay

## 2024-06-17 DIAGNOSIS — Z96641 Presence of right artificial hip joint: Secondary | ICD-10-CM | POA: Diagnosis not present

## 2024-06-17 DIAGNOSIS — Z96653 Presence of artificial knee joint, bilateral: Secondary | ICD-10-CM | POA: Diagnosis not present

## 2024-06-17 DIAGNOSIS — E119 Type 2 diabetes mellitus without complications: Secondary | ICD-10-CM | POA: Diagnosis not present

## 2024-06-17 DIAGNOSIS — W07XXXA Fall from chair, initial encounter: Secondary | ICD-10-CM | POA: Insufficient documentation

## 2024-06-17 DIAGNOSIS — M25532 Pain in left wrist: Secondary | ICD-10-CM | POA: Diagnosis present

## 2024-06-17 DIAGNOSIS — S52502A Unspecified fracture of the lower end of left radius, initial encounter for closed fracture: Secondary | ICD-10-CM

## 2024-06-17 DIAGNOSIS — Z79890 Hormone replacement therapy (postmenopausal): Secondary | ICD-10-CM | POA: Insufficient documentation

## 2024-06-17 DIAGNOSIS — I959 Hypotension, unspecified: Secondary | ICD-10-CM | POA: Insufficient documentation

## 2024-06-17 DIAGNOSIS — F32A Depression, unspecified: Secondary | ICD-10-CM | POA: Insufficient documentation

## 2024-06-17 DIAGNOSIS — Z7984 Long term (current) use of oral hypoglycemic drugs: Secondary | ICD-10-CM | POA: Diagnosis not present

## 2024-06-17 DIAGNOSIS — R918 Other nonspecific abnormal finding of lung field: Secondary | ICD-10-CM | POA: Diagnosis not present

## 2024-06-17 DIAGNOSIS — R112 Nausea with vomiting, unspecified: Secondary | ICD-10-CM | POA: Insufficient documentation

## 2024-06-17 DIAGNOSIS — R634 Abnormal weight loss: Secondary | ICD-10-CM | POA: Insufficient documentation

## 2024-06-17 DIAGNOSIS — S52612A Displaced fracture of left ulna styloid process, initial encounter for closed fracture: Secondary | ICD-10-CM | POA: Diagnosis not present

## 2024-06-17 DIAGNOSIS — K869 Disease of pancreas, unspecified: Secondary | ICD-10-CM | POA: Diagnosis not present

## 2024-06-17 DIAGNOSIS — G2581 Restless legs syndrome: Secondary | ICD-10-CM | POA: Diagnosis not present

## 2024-06-17 DIAGNOSIS — I4891 Unspecified atrial fibrillation: Principal | ICD-10-CM | POA: Diagnosis present

## 2024-06-17 DIAGNOSIS — W19XXXA Unspecified fall, initial encounter: Secondary | ICD-10-CM

## 2024-06-17 DIAGNOSIS — Y9289 Other specified places as the place of occurrence of the external cause: Secondary | ICD-10-CM | POA: Diagnosis not present

## 2024-06-17 DIAGNOSIS — Z79899 Other long term (current) drug therapy: Secondary | ICD-10-CM | POA: Insufficient documentation

## 2024-06-17 DIAGNOSIS — E039 Hypothyroidism, unspecified: Secondary | ICD-10-CM | POA: Diagnosis not present

## 2024-06-17 DIAGNOSIS — Y92009 Unspecified place in unspecified non-institutional (private) residence as the place of occurrence of the external cause: Secondary | ICD-10-CM | POA: Diagnosis not present

## 2024-06-17 HISTORY — DX: Type 2 diabetes mellitus without complications: E11.9

## 2024-06-17 HISTORY — DX: Chest pain, unspecified: R07.9

## 2024-06-17 HISTORY — DX: Paroxysmal atrial fibrillation: I48.0

## 2024-06-17 LAB — CBC
HCT: 38.9 % (ref 36.0–46.0)
HCT: 33.7 % — ABNORMAL LOW (ref 36.0–46.0)
Hemoglobin: 12.8 g/dL (ref 12.0–15.0)
Hemoglobin: 11.2 g/dL — ABNORMAL LOW (ref 12.0–15.0)
MCH: 30.1 pg (ref 26.0–34.0)
MCH: 30.5 pg (ref 26.0–34.0)
MCHC: 32.9 g/dL (ref 30.0–36.0)
MCHC: 33.2 g/dL (ref 30.0–36.0)
MCV: 91.5 fL (ref 80.0–100.0)
MCV: 91.8 fL (ref 80.0–100.0)
Platelets: 250 K/uL (ref 150–400)
Platelets: 231 K/uL (ref 150–400)
RBC: 4.25 MIL/uL (ref 3.87–5.11)
RBC: 3.67 MIL/uL — ABNORMAL LOW (ref 3.87–5.11)
RDW: 12.2 % (ref 11.5–15.5)
RDW: 12.2 % (ref 11.5–15.5)
WBC: 7.7 K/uL (ref 4.0–10.5)
WBC: 7.1 K/uL (ref 4.0–10.5)
nRBC: 0 % (ref 0.0–0.2)
nRBC: 0 % (ref 0.0–0.2)

## 2024-06-17 LAB — HEMOGLOBIN A1C
Hgb A1c MFr Bld: 7.2 % — ABNORMAL HIGH (ref 4.8–5.6)
Mean Plasma Glucose: 159.94 mg/dL

## 2024-06-17 LAB — URINALYSIS, COMPLETE (UACMP) WITH MICROSCOPIC
Bacteria, UA: NONE SEEN
Bilirubin Urine: NEGATIVE
Glucose, UA: NEGATIVE mg/dL
Ketones, ur: NEGATIVE mg/dL
Leukocytes,Ua: NEGATIVE
Nitrite: NEGATIVE
Protein, ur: NEGATIVE mg/dL
Specific Gravity, Urine: 1.045 — ABNORMAL HIGH (ref 1.005–1.030)
pH: 5 (ref 5.0–8.0)

## 2024-06-17 LAB — RESP PANEL BY RT-PCR (RSV, FLU A&B, COVID)  RVPGX2
Influenza A by PCR: NEGATIVE
Influenza B by PCR: NEGATIVE
Resp Syncytial Virus by PCR: NEGATIVE
SARS Coronavirus 2 by RT PCR: NEGATIVE

## 2024-06-17 LAB — BASIC METABOLIC PANEL WITH GFR
Anion gap: 12 (ref 5–15)
Anion gap: 10 (ref 5–15)
BUN: 13 mg/dL (ref 8–23)
BUN: 11 mg/dL (ref 8–23)
CO2: 23 mmol/L (ref 22–32)
CO2: 22 mmol/L (ref 22–32)
Calcium: 9.3 mg/dL (ref 8.9–10.3)
Calcium: 8.4 mg/dL — ABNORMAL LOW (ref 8.9–10.3)
Chloride: 101 mmol/L (ref 98–111)
Chloride: 104 mmol/L (ref 98–111)
Creatinine, Ser: 0.74 mg/dL (ref 0.44–1.00)
Creatinine, Ser: 0.51 mg/dL (ref 0.44–1.00)
GFR, Estimated: >60 mL/min
GFR, Estimated: >60 mL/min
Glucose, Bld: 198 mg/dL — ABNORMAL HIGH (ref 70–99)
Glucose, Bld: 192 mg/dL — ABNORMAL HIGH (ref 70–99)
Potassium: 4.1 mmol/L (ref 3.5–5.1)
Potassium: 3.7 mmol/L (ref 3.5–5.1)
Sodium: 136 mmol/L (ref 135–145)
Sodium: 136 mmol/L (ref 135–145)

## 2024-06-17 LAB — CBG MONITORING, ED
Glucose-Capillary: 170 mg/dL — ABNORMAL HIGH (ref 70–99)
Glucose-Capillary: 179 mg/dL — ABNORMAL HIGH (ref 70–99)
Glucose-Capillary: 173 mg/dL — ABNORMAL HIGH (ref 70–99)
Glucose-Capillary: 73 mg/dL (ref 70–99)

## 2024-06-17 LAB — MAGNESIUM: Magnesium: 1.8 mg/dL (ref 1.7–2.4)

## 2024-06-17 LAB — HEPATIC FUNCTION PANEL
ALT: 10 U/L (ref 0–44)
AST: 21 U/L (ref 15–41)
Albumin: 3.2 g/dL — ABNORMAL LOW (ref 3.5–5.0)
Alkaline Phosphatase: 64 U/L (ref 38–126)
Bilirubin, Direct: 0.2 mg/dL (ref 0.0–0.2)
Indirect Bilirubin: 0.3 mg/dL (ref 0.3–0.9)
Total Bilirubin: 0.5 mg/dL (ref 0.0–1.2)
Total Protein: 5.8 g/dL — ABNORMAL LOW (ref 6.5–8.1)

## 2024-06-17 LAB — PROTIME-INR
INR: 0.9 (ref 0.8–1.2)
Prothrombin Time: 13.1 s (ref 11.4–15.2)

## 2024-06-17 LAB — TROPONIN T, HIGH SENSITIVITY
Troponin T High Sensitivity: <15 ng/L (ref 0–19)
Troponin T High Sensitivity: <15 ng/L (ref 0–19)

## 2024-06-17 LAB — TSH: TSH: 5.87 u[IU]/mL — ABNORMAL HIGH (ref 0.350–4.500)

## 2024-06-17 MED ORDER — INSULIN ASPART 100 UNIT/ML IJ SOLN
0.0000 [IU] | Freq: Three times a day (TID) | INTRAMUSCULAR | Status: DC
  Administered 2024-06-17 (×3): 3 [IU] via SUBCUTANEOUS
  Filled 2024-06-17: qty 2
  Filled 2024-06-17 (×3): qty 3

## 2024-06-17 MED ORDER — HEPARIN (PORCINE) 25000 UT/250ML-% IV SOLN
1050.0000 [IU]/h | INTRAVENOUS | Status: DC
  Administered 2024-06-17: 1050 [IU]/h via INTRAVENOUS
  Filled 2024-06-17: qty 250

## 2024-06-17 MED ORDER — BIOTIN 5000 MCG PO CAPS: 5000.0000 ug | ORAL_CAPSULE | Freq: Every day | ORAL | Status: DC

## 2024-06-17 MED ORDER — HEPARIN BOLUS VIA INFUSION
4000.0000 [IU] | Freq: Once | INTRAVENOUS | Status: AC
  Administered 2024-06-17: 4000 [IU] via INTRAVENOUS
  Filled 2024-06-17: qty 4000

## 2024-06-17 MED ORDER — IOHEXOL 300 MG/ML  SOLN
100.0000 mL | Freq: Once | INTRAMUSCULAR | Status: AC | PRN
  Administered 2024-06-17: 100 mL via INTRAVENOUS

## 2024-06-17 MED ORDER — ONDANSETRON HCL 4 MG/2ML IJ SOLN
4.0000 mg | Freq: Four times a day (QID) | INTRAMUSCULAR | Status: DC | PRN
  Administered 2024-06-17: 4 mg via INTRAVENOUS
  Filled 2024-06-17: qty 2

## 2024-06-17 MED ORDER — MORPHINE SULFATE (PF) 2 MG/ML IV SOLN: 2.0000 mg | INTRAVENOUS | Status: DC | PRN

## 2024-06-17 MED ORDER — APIXABAN 5 MG PO TABS: 5.0000 mg | ORAL_TABLET | Freq: Two times a day (BID) | ORAL | Status: DC

## 2024-06-17 MED ORDER — APIXABAN 5 MG PO TABS
5.0000 mg | ORAL_TABLET | Freq: Two times a day (BID) | ORAL | Status: DC
  Administered 2024-06-17 (×2): 5 mg via ORAL
  Filled 2024-06-17 (×3): qty 1

## 2024-06-17 MED ORDER — LEVOTHYROXINE SODIUM 88 MCG PO TABS
88.0000 ug | ORAL_TABLET | Freq: Every day | ORAL | Status: DC
  Administered 2024-06-17 – 2024-06-18 (×2): 88 ug via ORAL
  Filled 2024-06-17 (×3): qty 1

## 2024-06-17 MED ORDER — OXYCODONE-ACETAMINOPHEN 5-325 MG PO TABS
2.0000 | ORAL_TABLET | Freq: Once | ORAL | Status: AC
  Administered 2024-06-17: 2 via ORAL
  Filled 2024-06-17: qty 2

## 2024-06-17 MED ORDER — INSULIN ASPART 100 UNIT/ML IJ SOLN: 0.0000 [IU] | Freq: Every day | INTRAMUSCULAR | Status: DC

## 2024-06-17 MED ORDER — PRAMIPEXOLE DIHYDROCHLORIDE 1 MG PO TABS
1.0000 mg | ORAL_TABLET | Freq: Two times a day (BID) | ORAL | Status: DC
  Administered 2024-06-17 (×2): 1 mg via ORAL
  Filled 2024-06-17 (×4): qty 1

## 2024-06-17 MED ORDER — OXYCODONE HCL 5 MG PO TABS: 10.0000 mg | ORAL_TABLET | ORAL | Status: DC | PRN

## 2024-06-17 MED ORDER — MAGNESIUM HYDROXIDE 400 MG/5ML PO SUSP
30.0000 mL | Freq: Every day | ORAL | Status: DC | PRN
  Filled 2024-06-17: qty 30

## 2024-06-17 MED ORDER — DILTIAZEM HCL 25 MG/5ML IV SOLN
15.0000 mg | INTRAVENOUS | Status: AC
  Administered 2024-06-17: 15 mg via INTRAVENOUS
  Filled 2024-06-17: qty 5

## 2024-06-17 MED ORDER — DILTIAZEM HCL 60 MG PO TABS
30.0000 mg | ORAL_TABLET | Freq: Four times a day (QID) | ORAL | Status: DC
  Filled 2024-06-17: qty 1

## 2024-06-17 MED ORDER — OXYCODONE HCL 5 MG PO TABS
5.0000 mg | ORAL_TABLET | ORAL | Status: DC | PRN
  Administered 2024-06-17 – 2024-06-18 (×3): 5 mg via ORAL
  Filled 2024-06-17 (×3): qty 1

## 2024-06-17 MED ORDER — METOPROLOL SUCCINATE ER 25 MG PO TB24
12.5000 mg | ORAL_TABLET | Freq: Every day | ORAL | Status: DC
  Administered 2024-06-17: 12.5 mg via ORAL
  Filled 2024-06-17 (×2): qty 1

## 2024-06-17 MED ORDER — ORAL CARE MOUTH RINSE: 15.0000 mL | OROMUCOSAL | Status: DC | PRN

## 2024-06-17 MED ORDER — DILTIAZEM HCL 60 MG PO TABS
120.0000 mg | ORAL_TABLET | Freq: Once | ORAL | Status: AC
  Administered 2024-06-17: 120 mg via ORAL
  Filled 2024-06-17: qty 2

## 2024-06-17 MED ORDER — LACTATED RINGERS IV BOLUS
1000.0000 mL | Freq: Once | INTRAVENOUS | Status: AC
  Administered 2024-06-17: 1000 mL via INTRAVENOUS

## 2024-06-17 MED ORDER — ACETAMINOPHEN 325 MG PO TABS
650.0000 mg | ORAL_TABLET | ORAL | Status: DC | PRN
  Administered 2024-06-17: 650 mg via ORAL
  Filled 2024-06-17: qty 2

## 2024-06-17 MED ORDER — TRAZODONE HCL 50 MG PO TABS
25.0000 mg | ORAL_TABLET | Freq: Every evening | ORAL | Status: DC | PRN
  Administered 2024-06-17: 25 mg via ORAL
  Filled 2024-06-17: qty 1

## 2024-06-17 MED ORDER — MAGNESIUM OXIDE -MG SUPPLEMENT 400 (240 MG) MG PO TABS
200.0000 mg | ORAL_TABLET | Freq: Every day | ORAL | Status: DC
  Administered 2024-06-17: 200 mg via ORAL
  Filled 2024-06-17: qty 1

## 2024-06-17 MED ORDER — POLYETHYLENE GLYCOL 3350 17 G PO PACK
17.0000 g | PACK | Freq: Every day | ORAL | Status: DC
  Administered 2024-06-17: 17 g via ORAL
  Filled 2024-06-17 (×2): qty 1

## 2024-06-17 MED ORDER — CITALOPRAM HYDROBROMIDE 20 MG PO TABS
40.0000 mg | ORAL_TABLET | Freq: Every evening | ORAL | Status: DC
  Administered 2024-06-17: 40 mg via ORAL
  Filled 2024-06-17: qty 2

## 2024-06-17 MED ORDER — IOHEXOL 9 MG/ML PO SOLN
500.0000 mL | ORAL | Status: AC
  Administered 2024-06-17 (×2): 500 mL via ORAL
  Filled 2024-06-17 (×2): qty 500

## 2024-06-17 NOTE — H&P (Addendum)
 "     [place]   PATIENT NAME: Valerie Duran    MR#:  [id number]  DATE OF BIRTH:  11/10/1945  DATE OF ADMISSION:  06/17/2024  PRIMARY CARE PHYSICIAN: Diedra Lame, MD   Patient is coming from: Home  REQUESTING/REFERRING PHYSICIAN: Gordan Huxley, MD  CHIEF COMPLAINT:   Chief Complaint  Patient presents with   Fall    HISTORY OF PRESENT ILLNESS:  Valerie Duran is a 78 y.o. Caucasianfemale with medical history significant for hypothyroidism, depression, GERD, osteoarthritis, prediabetes and RLS, presented to the emergency room with acute onset of fall with subsequent left wrist pain.  The patient stated that she had lightheadedness before falling but never passed out.  She caught herself with her left hand.  No head injuries.  No paresthesias or focal muscle weakness.  No witnessed seizures.  No chest pain or palpitations.  No cough or wheezing or hemoptysis.  She vomited after falling.  No diarrhea or melena or bright red bleeding per rectum.  No bilious vomitus or hematemesis.  No leg pain or recent travels or surgeries.  ED Course: When she came to the ER BP was initially 81/60 and later 99/88.  Heart rate was 160 and vital signs otherwise were within normal.  Labs are blood Kos 198 with otherwise unremarkable CMP.  CBC was normal coag profile was normal respiratory panel came back negative. EKG as reviewed by me :  EKG showed atrial fibrillation with rapid ventricular sponsor 147 with left axis deviation Imaging: Left wrist x-ray showed the following: 1. Acute nondisplaced intra-articular fracture of the distal radial metaphysis with mild impaction, dorsal buckling, and mild dorsal tilt of the distal radial articular surface, with at least one fracture plane extending into the radial styloid without significant articular incongruity. 2. Acute minimally displaced ulnar styloid fracture. 3. Subcutaneous soft tissue edema. 4. Advanced first CMC degenerative changes.  The  patient had her left wrist splinted and placed in an arm sling.  She was given 15 mg of IV Cardizem  bolus followed by 120 mg of p.o. Cardizem  with good control of her rate without conversion yet.  PAST MEDICAL HISTORY:   Past Medical History:  Diagnosis Date   Anemia    Arthritis    Back pain    Celiac disease    Depression    Fatigue    GERD (gastroesophageal reflux disease)    Heart murmur    Hypothyroidism    MVP (mitral valve prolapse)    Pneumonia    Pre-diabetes    RLS (restless legs syndrome)     PAST SURGICAL HISTORY:   Past Surgical History:  Procedure Laterality Date   ABDOMINAL HYSTERECTOMY     ANTERIOR CERVICAL DECOMP/DISCECTOMY FUSION N/A 02/29/2020   Procedure: ANTERIOR CERVICAL DECOMPRESSION/DISCECTOMY FUSION, INTERBODY PROSTHESIS, PLATE/SCREWS CERVICAL FOUR- CERVICAL FIVE; EXPLORE FUSION;RMOVE OLD PLATE;  Surgeon: Mavis Purchase, MD;  Location: [facility] OR;  Service: Neurosurgery;  Laterality: N/A;  ANTERIOR CERVICAL DECOMPRESSION/DISCECTOMY FUSION, INTERBODY PROSTHESIS, PLATE/SCREWS CERVICAL FOUR- CERVICAL FIVE; EXPLORE FUSION;RMOVE OLD PLATE   APPLICATION OF INTRAOPERATIVE CT SCAN N/A 07/06/2018   Procedure: APPLICATION OF INTRAOPERATIVE CT SCAN;  Surgeon: Mavis Purchase, MD;  Location: [facility] OR;  Service: Neurosurgery;  Laterality: N/A;   BACK SURGERY     spinal fusion   CATARACT EXTRACTION W/ INTRAOCULAR LENS  IMPLANT, BILATERAL Bilateral    CESAREAN SECTION N/A 1963 & 1965   COLONOSCOPY WITH PROPOFOL  N/A 02/04/2018   Procedure: COLONOSCOPY WITH PROPOFOL ;  Surgeon: Viktoria Lamar DASEN,  MD;  Location: [facility] ENDOSCOPY;  Service: Endoscopy;  Laterality: N/A;   COLONOSCOPY WITH PROPOFOL  N/A 07/15/2023   Procedure: COLONOSCOPY WITH PROPOFOL ;  Surgeon: Onita Elspeth Sharper, DO;  Location: [facility] ENDOSCOPY;  Service: Gastroenterology;  Laterality: N/A;   ESOPHAGOGASTRODUODENOSCOPY (EGD) WITH PROPOFOL  N/A 02/04/2018   Procedure: ESOPHAGOGASTRODUODENOSCOPY (EGD) WITH PROPOFOL ;   Surgeon: Viktoria Lamar DASEN, MD;  Location: [facility] ENDOSCOPY;  Service: Endoscopy;  Laterality: N/A;   EYE SURGERY     FOOT SURGERY Right 2007   FRACTURE SURGERY     HARDWARE REMOVAL Right 01/14/2015   Procedure: HARDWARE REMOVAL/ REMOVAL OF 3 CANNULATED SCREWS FROM RIGHT HIP.;  Surgeon: Lynwood SHAUNNA Hue, MD;  Location: [facility] ORS;  Service: Orthopedics;  Laterality: Right;   HIP FRACTURE SURGERY Right 2016   JOINT REPLACEMENT Bilateral 2014   knees   NECK SURGERY     fusion   POLYPECTOMY  07/15/2023   Procedure: POLYPECTOMY;  Surgeon: Onita Elspeth Sharper, DO;  Location: [facility] ENDOSCOPY;  Service: Gastroenterology;;   TOTAL HIP ARTHROPLASTY Right 01/14/2015   Procedure: TOTAL HIP ARTHROPLASTY;  Surgeon: Lynwood SHAUNNA Hue, MD;  Location: [facility] ORS;  Service: Orthopedics;  Laterality: Right;    SOCIAL HISTORY:   Social History   Tobacco Use   Smoking status: Never   Smokeless tobacco: Never  Substance Use Topics   Alcohol  use: No    FAMILY HISTORY:   Family History  Problem Relation Age of Onset   Cancer Father 64       Colon Cancer   Breast cancer Father 43   Breast cancer Sister    Stroke Mother    Heart disease Maternal Grandmother     DRUG ALLERGIES:  Allergies[1]  REVIEW OF SYSTEMS:   ROS As per history of present illness. All pertinent systems were reviewed above. Constitutional, HEENT, cardiovascular, respiratory, GI, GU, musculoskeletal, neuro, psychiatric, endocrine, integumentary and hematologic systems were reviewed and are otherwise negative/unremarkable except for positive findings mentioned above in the HPI.   MEDICATIONS AT HOME:   Prior to Admission medications  Medication Sig Start Date End Date Taking? Authorizing Provider  acetaminophen  (TYLENOL ) 650 MG CR tablet Take 650-1,300 mg by mouth every 8 (eight) hours as needed for pain.    [provider]  amoxicillin (AMOXIL) 500 MG capsule Take 2,000 mg by mouth See admin instructions. Take 4 capsules  (2000 mg) by mouth 1 hour prior to dental appointments    [provider]  Biotin  5000 MCG CAPS Take 5,000 mcg by mouth daily.    [provider]  citalopram  (CELEXA ) 20 MG tablet Take 20 mg by mouth every evening.  10/09/19   [provider]  cyclobenzaprine  (FLEXERIL ) 10 MG tablet Take 1 tablet (10 mg total) by mouth 3 (three) times daily as needed for muscle spasms. 03/01/20   Bergman, Meghan D, NP  docusate sodium  (COLACE) 100 MG capsule Take 1 capsule (100 mg total) by mouth 2 (two) times daily. 03/01/20   Bergman, Meghan D, NP  fexofenadine  (ALLEGRA ) 180 MG tablet Take 180 mg by mouth daily as needed (allergies.).     [provider]  fluticasone  (FLONASE ) 50 MCG/ACT nasal spray Place 1-2 sprays into both nostrils daily as needed (allergies/congestion.).  12/13/17   [provider]  furosemide  (LASIX ) 20 MG tablet Take 20 mg by mouth daily. 11/14/19   [provider]  Glucosamine Sulfate 1000 MG CAPS Take 2,000 mg by mouth daily.    [provider]  levothyroxine  (SYNTHROID ) 75  MCG tablet Take 75 mcg by mouth daily before breakfast.  12/11/19   [provider]  Magnesium  Oxide (MAG-200) 200 MG TABS Take 200 mg by mouth daily.    [provider]  metFORMIN  (GLUCOPHAGE -XR) 500 MG 24 hr tablet Take 500 mg by mouth daily with breakfast.  09/09/17 02/18/21  [provider]  oxyCODONE  10 MG TABS Take 1 tablet (10 mg total) by mouth every 4 (four) hours as needed for severe pain ((score 7 to 10)). 03/01/20   Bergman, Meghan D, NP  Polyethyl Glycol-Propyl Glycol (LUBRICANT EYE DROPS) 0.4-0.3 % SOLN Place 1 drop into both eyes 3 (three) times daily as needed (dry/irritated eyes.).    [provider]  pramipexole  (MIRAPEX ) 1 MG tablet Take 1 mg by mouth 2 (two) times daily.  06/28/14   [provider]      VITAL SIGNS:  Blood pressure (!) 102/57, pulse 70, temperature 98 F (36.7 C), temperature source  Oral, resp. rate (!) 24, height 5' 7 (1.702 m), weight 71.3 kg, SpO2 98%.  PHYSICAL EXAMINATION:  Physical Exam  GENERAL:  78 y.o.-year-old patient lying in the bed with no acute distress.  EYES: Pupils equal, round, reactive to light and accommodation. No scleral icterus. Extraocular muscles intact.  HEENT: Head atraumatic, normocephalic. Oropharynx and nasopharynx clear.  NECK:  Supple, no jugular venous distention. No thyroid  enlargement, no tenderness.  LUNGS: Normal breath sounds bilaterally, no wheezing, rales,rhonchi or crepitation. No use of accessory muscles of respiration.  CARDIOVASCULAR: Irregularly irregular  rhythm, S1, S2 normal. No murmurs, rubs, or gallops.  ABDOMEN: Soft, nondistended, nontender. Bowel sounds present. No organomegaly or mass.  EXTREMITIES: No pedal edema, cyanosis, or clubbing.  NEUROLOGIC: Cranial nerves II through XII are intact. Muscle strength 5/5 in all extremities. Sensation intact. Gait not checked.  PSYCHIATRIC: The patient is alert and oriented x 3.  Normal affect and good eye contact. SKIN: No obvious rash, lesion, or ulcer.   LABORATORY PANEL:   CBC Recent Labs  Lab 06/17/24 0430  WBC 7.1  HGB 11.2*  HCT 33.7*  PLT 231   ------------------------------------------------------------------------------------------------------------------  Chemistries  Recent Labs  Lab 06/17/24 0430  NA 136  K 3.7  CL 104  CO2 22  GLUCOSE 192*  BUN 11  CREATININE 0.51  CALCIUM  8.4*  MG 1.8   ------------------------------------------------------------------------------------------------------------------  Cardiac Enzymes No results for input(s): TROPONINI in the last 168 hours. ------------------------------------------------------------------------------------------------------------------  RADIOLOGY:  DG Wrist Complete Left Result Date: 06/17/2024 EXAM: 3 OR MORE VIEW(S) XRAY OF THE LEFT WRIST 06/17/2024 12:49:00 AM COMPARISON: None  available. CLINICAL HISTORY: fall FINDINGS: BONES AND JOINTS: Acute nondisplaced intra-articular fracture of distal radial metaphysis with the dominant fracture component extending transversely with mild impaction, dorsal buckling, and resultant mild dorsal tilt of the distal radial articular surface. At least 1 fracture plane is seen extending into the radial styloid without significant articular incongruity. Acute minimally displaced ulnar styloid fracture. Advanced first CMC degenerative changes. SOFT TISSUES: Subcutaneous soft tissue edema. IMPRESSION: 1. Acute nondisplaced intra-articular fracture of the distal radial metaphysis with mild impaction, dorsal buckling, and mild dorsal tilt of the distal radial articular surface, with at least one fracture plane extending into the radial styloid without significant articular incongruity. 2. Acute minimally displaced ulnar styloid fracture. 3. Subcutaneous soft tissue edema. 4. Advanced first CMC degenerative changes. Electronically signed by: Dorethia Molt MD 06/17/2024 12:56 AM EST RP Workstation: [id number]      IMPRESSION AND PLAN:  Assessment and Plan: * Atrial  fibrillation with RVR (HCC) - The patient will be admitted to a progressive unit observation bed. - Will continue her on p.o. Cardizem . - Will continue IV heparin  and start Eliquis  in AM. - 2D echo and cardiology consult will be obtained. - I notified [facility] group about the patient.  Fall at home, initial encounter - She could have had a presyncopal event with her atrial fibrillation that led to her fall. - She has subsequent left wrist fracture. - Pain management will be provided. - PT evaluation and management will be obtained. - She will need an orthopedic outpatient appointment for follow-up.  Controlled type 2 diabetes mellitus without complication, without long-term current use of insulin  (HCC) - The patient will be placed on supplemental coverage with NovoLog . - Will hold off  metformin .  Hypothyroidism - Continue Synthroid . - Will check TSH.  Depression - Will continue Celexa .  Restless leg syndrome - We will continue Mirapex .   DVT prophylaxis: IV heparin  followed by Eliquis . Advanced Care Planning:  Code Status: full code. Family Communication:  The plan of care was discussed in details with the patient (and family). I answered all questions. The patient agreed to proceed with the above mentioned plan. Further management will depend upon hospital course. Disposition Plan: Back to previous home environment Consults called: Cardiology All the records are reviewed and case discussed with ED provider.  Status is: Observation  I certify that at the time of admission, it is my clinical judgment that the patient will require hospital care extending less than 2 midnights.                            Dispo: The patient is from: Home              Anticipated d/c is to: Home              Patient currently is not medically stable to d/c.              Difficult to place patient: No  Madison DELENA Peaches M.D on 06/17/2024 at 6:14 AM  [facility]   From 7 PM-7 AM, contact night-coverage [email or web address]  CC: Primary care physician; Diedra Lame, MD     [1]  Allergies Allergen Reactions   Ambien  [Zolpidem  Tartrate] Other (See Comments)    Altered mental status   Barley Grass Other (See Comments)    Celiac disease   Wheat Rash    Celiac disease   Tape Other (See Comments)    Adhesive tape - tears skin   Codeine Nausea Only   Ibuprofen Other (See Comments) and Rash    GI upset  makes my stomach hurt   Simvastatin Rash   Valium  [Diazepam ] Other (See Comments)    Sleep walks   "

## 2024-06-17 NOTE — Assessment & Plan Note (Addendum)
-   She could have had a presyncopal event with her atrial fibrillation that led to her fall. - She has subsequent left wrist fracture. - Pain management will be provided. - PT evaluation and management will be obtained. - She will need an orthopedic outpatient appointment for follow-up.

## 2024-06-17 NOTE — Assessment & Plan Note (Signed)
-   The patient will be admitted to a progressive unit observation bed. - Will continue her on p.o. Cardizem . - Will continue IV heparin  and start Eliquis  in AM. - 2D echo and cardiology consult will be obtained. - I notified [facility] group about the patient.

## 2024-06-17 NOTE — Consult Note (Addendum)
 "  Cardiology Consult    Patient ID: Olla Delancey MRN: [id number], DOB/AGE: 1945/10/14   Admit date: 06/17/2024 Date of Consult: 06/17/2024  Primary Physician: Diedra Lame, MD Primary Cardiologist: Evalene Lunger, MD   Requesting Provider: GEANNIE Ban, MD  Patient Profile    Shaila Gilchrest is a 78 y.o. female with a history of diabetes, depression, fatigue, GERD, chronic low back pain, restless leg syndrome, osteoarthritis status post right total hip arthroplasty, anemia, and celiac disease who is being seen today for the evaluation of A-fib with RVR at the request of Dr. Ban.  Past Medical History   Subjective  Past Medical History:  Diagnosis Date   Anemia    Arthritis    Back pain    Celiac disease    Chest pain    a. 02/2018 MV: Small region of mild fixed perfusion defect in the distal inferolateral and apical inferolateral wall ? artifact-minimal on nonattenuation corrected images.  No ischemia.  EF 68%.  Low risk.   Depression    Diabetes (HCC)    Fatigue    GERD (gastroesophageal reflux disease)    Hypothyroidism    MVP (mitral valve prolapse)    PAF (paroxysmal atrial fibrillation) (HCC)    a. Dx 05/2024 - CHA2DS2VASc = 4.   Pneumonia    RLS (restless legs syndrome)     Past Surgical History:  Procedure Laterality Date   ABDOMINAL HYSTERECTOMY     ANTERIOR CERVICAL DECOMP/DISCECTOMY FUSION N/A 02/29/2020   Procedure: ANTERIOR CERVICAL DECOMPRESSION/DISCECTOMY FUSION, INTERBODY PROSTHESIS, PLATE/SCREWS CERVICAL FOUR- CERVICAL FIVE; EXPLORE FUSION;RMOVE OLD PLATE;  Surgeon: Mavis Purchase, MD;  Location: [facility] OR;  Service: Neurosurgery;  Laterality: N/A;  ANTERIOR CERVICAL DECOMPRESSION/DISCECTOMY FUSION, INTERBODY PROSTHESIS, PLATE/SCREWS CERVICAL FOUR- CERVICAL FIVE; EXPLORE FUSION;RMOVE OLD PLATE   APPLICATION OF INTRAOPERATIVE CT SCAN N/A 07/06/2018   Procedure: APPLICATION OF INTRAOPERATIVE CT SCAN;  Surgeon: Mavis Purchase, MD;  Location: [facility] OR;   Service: Neurosurgery;  Laterality: N/A;   BACK SURGERY     spinal fusion   CATARACT EXTRACTION W/ INTRAOCULAR LENS  IMPLANT, BILATERAL Bilateral    CESAREAN SECTION N/A 1963 & 1965   COLONOSCOPY WITH PROPOFOL  N/A 02/04/2018   Procedure: COLONOSCOPY WITH PROPOFOL ;  Surgeon: Viktoria Lamar DASEN, MD;  Location: [facility] ENDOSCOPY;  Service: Endoscopy;  Laterality: N/A;   COLONOSCOPY WITH PROPOFOL  N/A 07/15/2023   Procedure: COLONOSCOPY WITH PROPOFOL ;  Surgeon: Onita Elspeth Sharper, DO;  Location: [facility] ENDOSCOPY;  Service: Gastroenterology;  Laterality: N/A;   ESOPHAGOGASTRODUODENOSCOPY (EGD) WITH PROPOFOL  N/A 02/04/2018   Procedure: ESOPHAGOGASTRODUODENOSCOPY (EGD) WITH PROPOFOL ;  Surgeon: Viktoria Lamar DASEN, MD;  Location: [facility] ENDOSCOPY;  Service: Endoscopy;  Laterality: N/A;   EYE SURGERY     FOOT SURGERY Right 2007   FRACTURE SURGERY     HARDWARE REMOVAL Right 01/14/2015   Procedure: HARDWARE REMOVAL/ REMOVAL OF 3 CANNULATED SCREWS FROM RIGHT HIP.;  Surgeon: Lynwood SHAUNNA Hue, MD;  Location: [facility] ORS;  Service: Orthopedics;  Laterality: Right;   HIP FRACTURE SURGERY Right 2016   JOINT REPLACEMENT Bilateral 2014   knees   NECK SURGERY     fusion   POLYPECTOMY  07/15/2023   Procedure: POLYPECTOMY;  Surgeon: Onita Elspeth Sharper, DO;  Location: [facility] ENDOSCOPY;  Service: Gastroenterology;;   TOTAL HIP ARTHROPLASTY Right 01/14/2015   Procedure: TOTAL HIP ARTHROPLASTY;  Surgeon: Lynwood SHAUNNA Hue, MD;  Location: [facility] ORS;  Service: Orthopedics;  Laterality: Right;     Allergies  Allergies[1]     History of Present  Illness   78 year old female with a history of diabetes, depression, fatigue, GERD, chronic low back pain, restless leg syndrome, osteoarthritis status post right total hip arthroplasty, anemia, and celiac disease.  She was seen by Dr. Gollan in September 2019 in the setting of chest and arm pain and numbness.  Stress testing was undertaken and was low risk without ischemia.  There was a small  area of fixed perfusion defect in the distal inferolateral and apical inferolateral walls which was felt to represent artifact.  Patient lives locally with her husband.  In the setting of multiple back and knee surgeries with ongoing arthritis, she is not particularly active.  That said, she does not experience chest pain or dyspnea with usual activities.  When walking outside, she will use a walker due to unsteady gait with history of falls.  She was in her usual state of health until the evening of December 27, when she was at home and just rearranging something on a table.  She was standing and leaning over to move something when she had sudden onset of lightheadedness.  She lost her balance and fell backwards from the table.  In that setting, she threw her arms above her head and as she was falling towards the floor, her left forearm struck a chair.  She then landed on the floor but never lost consciousness.  She noted left arm pain.  She was able to crawl over to a couch and eventually get herself up on the couch, and alert her husband.  She continued to feel lightheaded throughout this episode.  At no point did she experience palpitations, nausea, vomiting, chest pain, or dyspnea.  She called her daughter who then arrived and took her to the [facility] emergency department.  On arrival to the ED, she was afebrile and tachycardic at 160 bpm.  Blood pressure was 81/60.  ECG showed A-fib, 147, left axis deviation, LVH, poor R wave progression.  Lab work relatively unremarkable with normal H&H of 12.8/38.9, normal white count of 7.7, potassium 4.1, BUN/creatinine 13/0.74.  Left wrist films showed acute nondisplaced intra-articular fracture of the distal radial metaphysis and acute minimally displaced ulnar styloid fracture.  In the setting of rapid atrial fibrillation, she was given 15 mg IV diltiazem  bolus followed by oral diltiazem  with improvement in rates to the 80s.  She converted to sinus rhythm just after  3 AM.  Despite this, she continues to have intermittent lightheadedness.  Blood pressures have remained soft in the 90s to low 100s. Meds, FH, SH, ROS    Subjective   Inpatient Medications    apixaban   5 mg Oral BID   citalopram   40 mg Oral QPM   insulin  aspart  0-15 Units Subcutaneous TID WC   insulin  aspart  0-5 Units Subcutaneous QHS   iohexol   500 mL Oral Q1H   levothyroxine   88 mcg Oral Q0600   magnesium  oxide  200 mg Oral Daily   polyethylene glycol  17 g Oral Daily   pramipexole   1 mg Oral BID    Family History    Family History  Problem Relation Age of Onset   Cancer Father 64       Colon Cancer   Breast cancer Father 67   Breast cancer Sister    Stroke Mother    Heart disease Maternal Grandmother    She indicated that her mother is deceased. She indicated that her father is deceased. She indicated that the status of her sister is  unknown. She indicated that her maternal grandmother is deceased.   Social History    Social History   Socioeconomic History   Marital status: Married    Spouse name: Not on file   Number of children: Not on file   Years of education: Not on file   Highest education level: Not on file  Occupational History   Not on file  Tobacco Use   Smoking status: Never   Smokeless tobacco: Never  Vaping Use   Vaping status: Never Used  Substance and Sexual Activity   Alcohol  use: No   Drug use: No   Sexual activity: Yes    Birth control/protection: Surgical  Other Topics Concern   Not on file  Social History Narrative   Lives locally with husband.  Does not routinely exercise.   Social Drivers of Health   Tobacco Use: Low Risk (06/17/2024)   Patient History    Smoking Tobacco Use: Never    Smokeless Tobacco Use: Never    Passive Exposure: Not on file  Financial Resource Strain: Low Risk  (04/07/2024)   Received from [facility]   Overall Financial Resource Strain (CARDIA)    Difficulty of Paying Living  Expenses: Not hard at all  Food Insecurity: No Food Insecurity (04/07/2024)   Received from [facility]   Epic    Within the past 12 months, you worried that your food would run out before you got the money to buy more.: Never true    Within the past 12 months, the food you bought just didn't last and you didn't have money to get more.: Never true  Transportation Needs: No Transportation Needs (04/07/2024)   Received from [facility]   PRAPARE - Transportation    Lack of Transportation (Non-Medical): No    In the past 12 months, has lack of transportation kept you from medical appointments or from getting medications?: No  Physical Activity: Not on file  Stress: Not on file  Social Connections: Not on file  Intimate Partner Violence: Not on file  Depression (EYV7-0): Not on file  Alcohol  Screen: Not on file  Housing: Low Risk  (04/07/2024)   Received from [facility]   Epic    At any time in the past 12 months, were you homeless or living in a shelter (including now)?: No    In the past 12 months, how many times have you moved where you were living?: 1    In the last 12 months, was there a time when you were not able to pay the mortgage or rent on time?: No  Utilities: Not At Risk (04/07/2024)   Received from [facility]   Epic    In the past 12 months has the electric, gas, oil, or water company threatened to shut off services in your home?: No  Health Literacy: Not on file     Review of Systems    General:  No chills, fever, night sweats or weight changes.  Cardiovascular:  +++ Presyncope.  No chest pain, dyspnea on exertion, edema, orthopnea, palpitations, paroxysmal nocturnal dyspnea. Dermatological: No rash, lesions/masses Respiratory: No cough, dyspnea Urologic: No hematuria, dysuria Abdominal:   No nausea, vomiting, diarrhea, bright red blood per rectum, melena, or hematemesis Neurologic:  No visual  changes, wkns, changes in mental status. Musculoskeletal: Chronic unsteady gait with history of falls. All other systems reviewed and are otherwise negative except as noted above.  Exam, Labs, Other  Objective   Physical Exam    Blood pressure 103/65, pulse 66, temperature (!) 96.8 F (36 C), temperature source Axillary, resp. rate 20, height 5' 7 (1.702 m), weight 71.3 kg, SpO2 100%.  General: Pleasant, NAD Psych: Normal affect. Neuro: Alert and oriented X 3. Moves all extremities spontaneously. HEENT: Normal  Neck: Supple without bruits or JVD. Lungs:  Resp regular and unlabored, CTA. Heart: RRR no s3, s4, or murmurs. Abdomen: Soft, non-tender, non-distended, BS + x 4.  Extremities: No clubbing, cyanosis or edema. DP/PT2+, Radials 2+ and equal bilaterally.  Labs    Recent Labs  Lab 06/17/24 0045 06/17/24 0430  TRNPT <15 <15  ] BNP    Component Value Date/Time   BNP 127.0 (H) 07/19/2018 1520    Lab Results  Component Value Date   WBC 7.1 06/17/2024   HGB 11.2 (L) 06/17/2024   HCT 33.7 (L) 06/17/2024   MCV 91.8 06/17/2024   PLT 231 06/17/2024    Recent Labs  Lab 06/17/24 0430  NA 136  K 3.7  CL 104  CO2 22  BUN 11  CREATININE 0.51  CALCIUM  8.4*  PROT 5.8*  BILITOT 0.5  ALKPHOS 64  ALT 10  AST 21  GLUCOSE 192*     Radiology Studies    DG Chest Port 1 View Result Date: 06/17/2024 CLINICAL DATA:  Weight loss. EXAM: PORTABLE CHEST 1 VIEW COMPARISON:  08/06/2014 FINDINGS: Cardiopericardial silhouette is at upper limits of normal for size. Asymmetric hazy opacities identified over the right upper lung. Lungs otherwise clear. No acute bony abnormality. Telemetry leads overlie the chest. IMPRESSION: Asymmetric hazy opacities over the right upper lung in this patient with a reported history of weight loss. Chest CT without contrast recommended to further evaluate Electronically Signed   By: Camellia Candle M.D.   On: 06/17/2024 09:47   DG Wrist Complete  Left Result Date: 06/17/2024 EXAM: 3 OR MORE VIEW(S) XRAY OF THE LEFT WRIST 06/17/2024 12:49:00 AM COMPARISON: None available. CLINICAL HISTORY: fall FINDINGS: BONES AND JOINTS: Acute nondisplaced intra-articular fracture of distal radial metaphysis with the dominant fracture component extending transversely with mild impaction, dorsal buckling, and resultant mild dorsal tilt of the distal radial articular surface. At least 1 fracture plane is seen extending into the radial styloid without significant articular incongruity. Acute minimally displaced ulnar styloid fracture. Advanced first CMC degenerative changes. SOFT TISSUES: Subcutaneous soft tissue edema. IMPRESSION: 1. Acute nondisplaced intra-articular fracture of the distal radial metaphysis with mild impaction, dorsal buckling, and mild dorsal tilt of the distal radial articular surface, with at least one fracture plane extending into the radial styloid without significant articular incongruity. 2. Acute minimally displaced ulnar styloid fracture. 3. Subcutaneous soft tissue edema. 4. Advanced first CMC degenerative changes. Electronically signed by: Dorethia Molt MD 06/17/2024 12:56 AM EST RP Workstation: [id number]      ECG & Cardiac Imaging    December 27 at 12:26 AM: A-fib, 147, left axis deviation, LVH, poor R wave progression- personally reviewed. December 27 at 2:05 AM: A-fib at 85, left axis deviation, delayed R wave progression-personally reviewed  Assessment & Plan    1.  Atrial fibrillation with rapid ventricular response: Patient without significant prior cardiac history who was at home on December 27 and while standing, had acute onset of lightheadedness followed by fall which resulted in a left wrist fracture.  In the ED, she was found to be in A-fib with RVR which responded well to IV diltiazem  and  she subsequently converted to sinus rhythm just after 3 AM this morning.  She is continue to experience some lightheadedness in the  setting of relative hypotension, despite conversion to sinus rhythm.  Lab work relatively unremarkable.  TSH mildly elevated at 5.870 (on levothyroxine  at home).  Follow-up echo.  Agree with transitioning to oral apixaban  provided that Ortho has no plans for surgical management of left wrist (CHA2DS2-VASc equals 4).  Currently off of AV nodal blocking agents due to relative hypotension.  Will add low-dose Toprol -XL 12.5 mg daily.  Continue to follow telemetry with plan for outpatient event monitoring if no significant events are noted on telemetry while hospitalized, to assess for other potential arrhythmic causes of presyncope.    2.  Fall/left wrist fracture: Currently splinted.  Management per medicine and Ortho.  3.  Relative hypotension/presyncope: Patient was hypotensive upon arrival in the setting of rapid atrial fibrillation though even after conversion, pressures have remained soft in the 90s to low 100s thus hypotension cannot be entirely explained by arrhythmia.  She is not on any antihypertensives at home and lab work is relatively unremarkable.  Troponins are normal.  Await echo.  May need to consider midodrine.  4.  Type 2 diabetes mellitus: A1c 7.2.  She is on metformin  at home.  Management per medicine team.  5.  Abnormal chest x-ray: Asymmetric hazy opacities over the right upper lung.  In setting of reported weight loss in the outpatient setting, recommendation made for chest CT without contrast.  Defer to medicine team.  Risk Assessment/Risk Scores:          CHA2DS2-VASc Score = 4   This indicates a 4.8% annual risk of stroke. The patient's score is based upon: CHF History: 0 HTN History: 0 Diabetes History: 1 Stroke History: 0 Vascular Disease History: 0 Age Score: 2 Gender Score: 1     Signed, Lonni Meager, NP 06/17/2024, 10:48 AM  For questions or updates, please contact   Please consult [email or web address] for contact info under Cardiology/STEMI.       [1]   Allergies Allergen Reactions   Ambien  [Zolpidem  Tartrate] Other (See Comments)    Altered mental status   Barley Grass Other (See Comments)    Celiac disease   Wheat Rash    Celiac disease   Tape Other (See Comments)    Adhesive tape - tears skin   Codeine Nausea Only   Ibuprofen Other (See Comments) and Rash    GI upset  makes my stomach hurt   Simvastatin Rash   Valium  [Diazepam ] Other (See Comments)    Sleep walks   "

## 2024-06-17 NOTE — Assessment & Plan Note (Addendum)
-  Continue Synthroid -Will check TSH

## 2024-06-17 NOTE — ED Notes (Signed)
 Pt vomited entire contents of CT contrast. Per CT, give PRN ordered nausea meds, instruct patient to attempt to drink contrast and let them know when finished.

## 2024-06-17 NOTE — ED Provider Notes (Signed)
 "  [facility] Provider Note    Event Date/Time   First MD Initiated Contact with Patient 06/17/24 [phone]     (approximate)   History   Fall   HPI Valerie Duran is a 78 y.o. female with no significant prior history of heart disease although she has seen Dr. Gollan in the past.  She presents tonight for evaluation after a fall.  She was doing some decorating for the holiday and fell backwards against a chair, striking her left wrist on a wooden surface.  She did not hit her head or lose consciousness.  She has pain in her left wrist that is severe and a little bit of bruising, minimal swelling.  Of note, she was ambulatory when she came to triaged by private vehicle and was found to have a heart rate in the 160s with borderline hypotension.  Her EKG indicated she is in A-fib which is a new issue for her.  She said that she had no idea and that she was not feeling palpitations or chest pain and she does not know when this may have started.     Physical Exam   Triage Vital Signs: ED Triage Vitals  Encounter Vitals Group     BP 06/17/24 0019 (!) 81/60     Girls Systolic BP Percentile --      Girls Diastolic BP Percentile --      Boys Systolic BP Percentile --      Boys Diastolic BP Percentile --      Pulse Rate 06/17/24 0019 (!) 160     Resp 06/17/24 0019 18     Temp 06/17/24 0019 98 F (36.7 C)     Temp Source 06/17/24 0019 Oral     SpO2 06/17/24 0019 98 %     Weight --      Height --      Head Circumference --      Peak Flow --      Pain Score 06/17/24 0020 10     Pain Loc --      Pain Education --      Exclude from Growth Chart --     Most recent vital signs: Vitals:   06/17/24 0019 06/17/24 0100  BP: (!) 81/60 99/88  Pulse: (!) 160 65  Resp: 18 (!) 22  Temp: 98 F (36.7 C)   SpO2: 98% 100%    General: Awake, appears uncomfortable from wrist pain but otherwise is well-appearing. CV:  Good peripheral perfusion.  Normal heart sounds but  irregularly irregular rhythm with a heart rate ranging from about 120-160. Resp:  Normal effort. Speaking easily and comfortably, no accessory muscle usage nor intercostal retractions.  Lungs clear to auscultation. Abd:  No distention.  No tenderness to palpation of the abdomen. Other:  No visible deformity to the left wrist but tenderness to palpation all along the wrist and with any amount of flexion or extension.   ED Results / Procedures / Treatments   Labs (all labs ordered are listed, but only abnormal results are displayed) Labs Reviewed  BASIC METABOLIC PANEL WITH GFR - Abnormal; Notable for the following components:      Result Value   Glucose, Bld 198 (*)    All other components within normal limits  RESP PANEL BY RT-PCR (RSV, FLU A&B, COVID)  RVPGX2  CBC  PROTIME-INR  MAGNESIUM   TROPONIN T, HIGH SENSITIVITY     EKG  ED ECG REPORT I, Darleene Dome, the  attending physician, personally viewed and interpreted this ECG.  Date: 06/17/2024 EKG Time: 00: 26 Rate: 147 Rhythm: A-fib with RVR QRS Axis: normal Intervals: Abnormal due to A-fib and lack of P waves ST/T Wave abnormalities: Non-specific ST segment / T-wave changes, but no clear evidence of acute ischemia. Narrative Interpretation: no definitive evidence of acute ischemia; does not meet STEMI criteria.    RADIOLOGY I independently viewed and interpret the patient's wrist x-rays and she has a intra-articular fracture of the distal radius.  Radiologist confirmed this finding as well along with ulnar styloid fracture.   PROCEDURES:  Critical Care performed: Yes, see critical care procedure note(s)  .1-3 Lead EKG Interpretation  Performed by: Gordan Huxley, MD Authorized by: Gordan Huxley, MD     Interpretation: abnormal     ECG rate:  147   ECG rate assessment: tachycardic     Rhythm: atrial fibrillation     Ectopy: none     Conduction: normal   .Critical Care  Performed by: Gordan Huxley,  MD Authorized by: Gordan Huxley, MD   Critical care provider statement:    Critical care time (minutes):  45   Critical care time was exclusive of:  Separately billable procedures and treating other patients   Critical care was necessary to treat or prevent imminent or life-threatening deterioration of the following conditions:  Circulatory failure and cardiac failure   Critical care was time spent personally by me on the following activities:  Development of treatment plan with patient or surrogate, evaluation of patient's response to treatment, examination of patient, obtaining history from patient or surrogate, ordering and performing treatments and interventions, ordering and review of laboratory studies, ordering and review of radiographic studies, pulse oximetry, re-evaluation of patient's condition and review of old charts .Ortho Injury Treatment  Date/Time: 06/17/2024 1:39 AM  Performed by: Gordan Huxley, MD Authorized by: Gordan Huxley, MD   Consent:    Consent obtained:  Verbal   Consent given by:  Patient   Risks discussed:  FractureInjury location: wrist Location details: left wrist Injury type: fracture Fracture type: distal radius and ulnar styloid Pre-procedure neurovascular assessment: neurovascularly intact Pre-procedure distal perfusion: normal Pre-procedure neurological function: normal Pre-procedure range of motion: reduced  Anesthesia: Local anesthesia used: no  Patient sedated: NoManipulation performed: no Immobilization: splint Splint type: sugar tong Splint Applied by: ED Provider Supplies used: Ortho-Glass Post-procedure neurovascular assessment: post-procedure neurovascularly intact Post-procedure distal perfusion: normal Post-procedure neurological function: normal Post-procedure range of motion: unchanged       IMPRESSION / MDM / ASSESSMENT AND PLAN / ED COURSE  I reviewed the triage vital signs and the nursing notes.                               Differential diagnosis includes, but is not limited to, new onset A-fib, fracture, sprain, contusion, electrolyte abnormality, less likely ACS  Patient's presentation is most consistent with acute presentation with potential threat to life or bodily function.  Labs/studies ordered: EKG, wrist x-rays, high-sensitivity troponin, pro time-INR, CBC, respiratory viral panel, BMP, magnesium  level  Interventions/Medications given:  Medications  lactated ringers  bolus 1,000 mL (has no administration in time range)  diltiazem  (CARDIZEM ) injection 15 mg (has no administration in time range)    (Note:  hospital course my include additional interventions and/or labs/studies not listed above.)   Vital signs notable for borderline hypotension but the patient said her blood pressure is always low, along with  A-fib with RVR which is a new finding.  Labs are all reassuring including a negative high-sensitivity troponin and normal electrolytes although magnesium  is pending.  No vital symptoms at this time but I ordered a respiratory viral panel given that we are in the middle of flu season and everyone else seems to have flu (looking for reasons that she may have gone into A-fib).  We have no idea when she went into A-fib and thus I cannot cardiovert her in the ED without risk of dislodging a clot.  We discussed the risks and benefits of inpatient versus outpatient management but given her age and comorbidities I think it is safest to bring her into the hospital on heparin .  I am giving a dose of diltiazem  50 mg IV for rate control and will monitor.  Patient has a left wrist fracture which we will place in a sugar-tong splint after her rate is controlled and we can remove the peripheral IV that is in her left antecubital fossa  The patient is on the cardiac monitor to evaluate for evidence of arrhythmia and/or significant heart rate changes.   Clinical Course as of 06/17/24 [id number]  Sat Jun 17, 2024  0202  Patient's heart rate down to the 70s with stable blood pressure, actually slightly improved from prior.  I am ordering diltiazem  [CF]  0204 I am consulting the hospitalist team for admission.   [CF]  0210 I consulted by phone with the admitting hospitalist, and they will admit the patient - Dr. Lawence [CF]    Clinical Course User Index [CF] Gordan Huxley, MD     FINAL CLINICAL IMPRESSION(S) / ED DIAGNOSES   Final diagnoses:  New onset atrial fibrillation (HCC)  Atrial fibrillation with RVR (HCC)  Closed fracture of distal end of left radius, unspecified fracture morphology, initial encounter  Traumatic closed fracture of ulnar styloid with minimal displacement, left, initial encounter     Rx / DC Orders   ED Discharge Orders     None        Note:  This document was prepared using Dragon voice recognition software and may include unintentional dictation errors.   Gordan Huxley, MD 06/17/24 [phone]  "

## 2024-06-17 NOTE — Assessment & Plan Note (Signed)
-   The patient will be placed on supplemental coverage with NovoLog. - Will hold off metformin.

## 2024-06-17 NOTE — ED Notes (Signed)
 Pt ambulated to BR using personal walker. Tolerated well.

## 2024-06-17 NOTE — ED Triage Notes (Signed)
 Pt to ed from home via POV for fall. Pt fell back against a chair and hit her left wrist. Denies hitting her head. Pt has wrist brace in place now. Pt is caox4, in no acute distress and was ambulatory in lobby with rollator walker.

## 2024-06-17 NOTE — Evaluation (Signed)
 Occupational Therapy Evaluation Patient Details Name: Valerie Duran MRN: [id number] DOB: Jan 26, 1946 Today's Date: 06/17/2024   History of Present Illness   Valerie Duran is a 78 y.o. Caucasianfemale with medical history significant for hypothyroidism, depression, GERD, osteoarthritis, prediabetes and RLS, presented to the emergency room with acute onset of fall with subsequent left wrist pain. Admitted for afib with RVR. L wrist x-ray: Acute nondisplaced intra-articular fracture of the distal radial metaphysis and acute minimally displaced ulnar styloid fracture. Per ortho plan to splint and manage NWB with outpatient follow up.   Clinical Impressions Valerie Duran was seen for OT evaluation this date. Prior to hospital admission, pt was MOD I using 4WW in community, furniture walking at home. Pt lives with spouse who requires light assist. Pt currently requires CGA + HHA for toilet t/f, SBA clothing mgmt and hand hygiene standing. Educated on functional application of LUE NWBing pcns, edema mgmt, and falls safety. Pt would benefit from skilled OT to address noted impairments and functional limitations (see below for any additional details). Upon hospital discharge, recommend OT follow up.   If plan is discharge home, recommend the following:   Help with stairs or ramp for entrance     Functional Status Assessment   Patient has had a recent decline in their functional status and demonstrates the ability to make significant improvements in function in a reasonable and predictable amount of time.     Equipment Recommendations   None recommended by OT     Recommendations for Other Services         Precautions/Restrictions   Precautions Precautions: Fall Recall of Precautions/Restrictions: Intact Restrictions Weight Bearing Restrictions Per Provider Order: Yes LUE Weight Bearing Per Provider Order: Non weight bearing     Mobility Bed Mobility Overal bed mobility:  Modified Independent                  Transfers Overall transfer level: Needs assistance Equipment used: None Transfers: Sit to/from Stand Sit to Stand: Supervision                  Balance Overall balance assessment: Needs assistance Sitting-balance support: No upper extremity supported, Feet supported Sitting balance-Leahy Scale: Normal     Standing balance support: No upper extremity supported, During functional activity Standing balance-Leahy Scale: Fair                             ADL either performed or assessed with clinical judgement   ADL Overall ADL's : Needs assistance/impaired                                       General ADL Comments: CGA + HHA for toilet t/f, SBA clothing mgmt and hand hygiene standing      Pertinent Vitals/Pain Pain Assessment Pain Assessment: 0-10 Pain Score: 6  Pain Location: L wrist Pain Descriptors / Indicators: Discomfort, Grimacing Pain Intervention(s): Limited activity within patient's tolerance, Patient requesting pain meds-RN notified     Extremity/Trunk Assessment Upper Extremity Assessment Upper Extremity Assessment: Right hand dominant;LUE deficits/detail LUE Deficits / Details: can move fingers in splint, minimal edema noted LUE: Unable to fully assess due to immobilization   Lower Extremity Assessment Lower Extremity Assessment: Overall WFL for tasks assessed       Communication Communication Communication: No apparent difficulties   Cognition Arousal: Alert  Behavior During Therapy: WFL for tasks assessed/performed Cognition: No apparent impairments                               Following commands: Intact       Cueing  General Comments   Cueing Techniques: Verbal cues      Exercises     Shoulder Instructions      Home Living Family/patient expects to be discharged to:: Private residence Living Arrangements: Spouse/significant other Available  Help at Discharge: Family;Available 24 hours/day Type of Home: House Home Access: Level entry     Home Layout: One level               Home Equipment: Rollator (4 wheels);Cane - quad   Additional Comments: reports plan for sister to come stay and assist      Prior Functioning/Environment Prior Level of Function : Independent/Modified Independent             Mobility Comments: endorses furniture walking in home, rollator in community. 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 who is mobile with RW      OT Problem List: Decreased strength;Decreased activity tolerance;Impaired balance (sitting and/or standing);Impaired UE functional use   OT Treatment/Interventions: Self-care/ADL training;Therapeutic exercise;Energy conservation;DME and/or AE instruction;Therapeutic activities;Patient/family education;Balance training      OT Goals(Current goals can be found in the care plan section)   Acute Rehab OT Goals Patient Stated Goal: to go home OT Goal Formulation: With patient/family Time For Goal Achievement: 07/01/24 Potential to Achieve Goals: Good ADL Goals Pt Will Perform Grooming: with modified independence;standing Pt Will Perform Lower Body Dressing: with modified independence;sit to/from stand Pt Will Transfer to Toilet: with modified independence;ambulating;regular height toilet   OT Frequency:  Min 1X/week    Co-evaluation              AM-PAC OT 6 Clicks Daily Activity     Outcome Measure Help from another person eating meals?: None Help from another person taking care of personal grooming?: None Help from another person toileting, which includes using toliet, bedpan, or urinal?: A Little Help from another person bathing (including washing, rinsing, drying)?: A Little Help from another person to put on and taking off regular upper body clothing?: None Help from another person to put on and taking off regular lower body clothing?: A Little 6 Click Score: 21    End of Session Nurse Communication: Patient requests pain meds  Activity Tolerance: Patient tolerated treatment well Patient left: in bed;with call bell/phone within reach;with family/visitor present  OT Visit Diagnosis: Other abnormalities of gait and mobility (R26.89);Muscle weakness (generalized) (M62.81)                Time: 8661-8598 OT Time Calculation (min): 23 min Charges:  OT General Charges $OT Visit: 1 Visit OT Evaluation $OT Eval Low Complexity: 1 Low OT Treatments $Self Care/Home Management : 8-22 mins  Elston Slot, M.S. OTR/L  06/17/2024, 2:44 PM  ascom [phone]

## 2024-06-17 NOTE — Assessment & Plan Note (Signed)
We will continue Mirapex 

## 2024-06-17 NOTE — Progress Notes (Signed)
 ANTICOAGULATION CONSULT NOTE  Pharmacy Consult for heparin  infusion Indication: new onset A-Fib w/ RVR  Allergies[1]  Patient Measurements: Height: 5' 7 (170.2 cm) Weight: 71.3 kg (157 lb 3 oz) IBW/kg (Calculated) : 61.6 HEPARIN  DW (KG): 71.3  Vital Signs: Temp: 98 F (36.7 C) (12/27 0019) Temp Source: Oral (12/27 0019) BP: 102/57 (12/27 0200) Pulse Rate: 78 (12/27 0200)  Labs: Recent Labs    06/17/24 0045  HGB 12.8  HCT 38.9  PLT 250  LABPROT 13.1  INR 0.9  CREATININE 0.74    Estimated Creatinine Clearance: 55.5 mL/min (by C-G formula based on SCr of 0.74 mg/dL).   Medical History: Past Medical History:  Diagnosis Date   Anemia    Arthritis    Back pain    Celiac disease    Depression    Fatigue    GERD (gastroesophageal reflux disease)    Heart murmur    Hypothyroidism    MVP (mitral valve prolapse)    Pneumonia    Pre-diabetes    RLS (restless legs syndrome)     Assessment: Pt is a 78 yo female presenting to ED after a fall w/o LOC, found with new onset A-Fib w/ RVR.  Goal of Therapy:  Heparin  level 0.3-0.7 units/ml Monitor platelets by anticoagulation protocol: Yes   Plan:  Bolus 4000 units x 1 Start heparin  infusion at 1050 units/hr Will check HL in 8 hr after start of infusion CBC daily while on heparin   Rankin CANDIE Dills, PharmD, [facility] 06/17/2024 2:15 AM      [1]  Allergies Allergen Reactions   Ambien  [Zolpidem  Tartrate] Other (See Comments)    Altered mental status   Barley Grass Other (See Comments)    Celiac disease   Wheat Rash    Celiac disease   Tape Other (See Comments)    Adhesive tape - tears skin   Codeine Nausea Only   Ibuprofen Other (See Comments) and Rash    GI upset  makes my stomach hurt   Simvastatin Rash   Valium  [Diazepam ] Other (See Comments)    Sleep walks

## 2024-06-17 NOTE — Assessment & Plan Note (Addendum)
Will continue Celexa

## 2024-06-17 NOTE — Progress Notes (Signed)
 Interim progress note - not for billing.  I have seen and examined the patient, reviewed the chart, and I agree with the assessment and plan outlined in the history and physical unless stipulated otherwise.   For patient's wrist fracture, Dr. Lorelle advises (per secure chat): keep in splint and elevated, non weight bearing for office follow up with Dr dalton in our clinic sometime in next 7-10 days. PT/OT consults ordered.  For patient's a-fib with rvr, rate is now controlled. New diagnosis. May have contributed to recent episodes of lightheadedness. Cardiology is consulted. Checking TSH. Echo pending. Hypotensive currently, will d/c diltiazem . Apixaban  ordered.  Patient also reports several months unintentional weight loss, early satiety, occasional post-prandial nausea/vomiting. Review of prior office visit notes show weight loss is only 5 or so pounds this past year, but the early satiety and occasional vomiting is of some concern. Will add on diff, f/u A1c, check ua, LFTs, f/u TSH, hiv/hcv, cxr, and CT of abdomen/pelvis.

## 2024-06-18 ENCOUNTER — Observation Stay
Admit: 2024-06-18 | Discharge: 2024-06-18 | Disposition: A | Attending: Nurse Practitioner | Admitting: Nurse Practitioner

## 2024-06-18 ENCOUNTER — Ambulatory Visit: Attending: Nurse Practitioner

## 2024-06-18 ENCOUNTER — Other Ambulatory Visit: Payer: Self-pay | Admitting: Nurse Practitioner

## 2024-06-18 DIAGNOSIS — R55 Syncope and collapse: Secondary | ICD-10-CM

## 2024-06-18 DIAGNOSIS — I4891 Unspecified atrial fibrillation: Secondary | ICD-10-CM

## 2024-06-18 DIAGNOSIS — I959 Hypotension, unspecified: Secondary | ICD-10-CM | POA: Diagnosis not present

## 2024-06-18 DIAGNOSIS — I480 Paroxysmal atrial fibrillation: Secondary | ICD-10-CM

## 2024-06-18 DIAGNOSIS — S52502A Unspecified fracture of the lower end of left radius, initial encounter for closed fracture: Secondary | ICD-10-CM | POA: Diagnosis not present

## 2024-06-18 LAB — CBC WITH DIFFERENTIAL/PLATELET
Abs Immature Granulocytes: 0.01 K/uL (ref 0.00–0.07)
Basophils Absolute: 0.1 K/uL (ref 0.0–0.1)
Basophils Relative: 1 %
Eosinophils Absolute: 0.3 K/uL (ref 0.0–0.5)
Eosinophils Relative: 4 %
HCT: 39.7 % (ref 36.0–46.0)
Hemoglobin: 12.5 g/dL (ref 12.0–15.0)
Immature Granulocytes: 0 %
Lymphocytes Relative: 28 %
Lymphs Abs: 1.8 K/uL (ref 0.7–4.0)
MCH: 30.2 pg (ref 26.0–34.0)
MCHC: 31.5 g/dL (ref 30.0–36.0)
MCV: 95.9 fL (ref 80.0–100.0)
Monocytes Absolute: 0.5 K/uL (ref 0.1–1.0)
Monocytes Relative: 7 %
Neutro Abs: 3.8 K/uL (ref 1.7–7.7)
Neutrophils Relative %: 60 %
Platelets: 253 K/uL (ref 150–400)
RBC: 4.14 MIL/uL (ref 3.87–5.11)
RDW: 12.3 % (ref 11.5–15.5)
WBC: 6.5 K/uL (ref 4.0–10.5)
nRBC: 0 % (ref 0.0–0.2)

## 2024-06-18 LAB — CBG MONITORING, ED: Glucose-Capillary: 135 mg/dL — ABNORMAL HIGH (ref 70–99)

## 2024-06-18 LAB — ECHOCARDIOGRAM COMPLETE
AR max vel: 3.08 cm2
AV Peak grad: 6.9 mmHg
Ao pk vel: 1.31 m/s
Area-P 1/2: 3.48 cm2
Height: 67 in
S' Lateral: 2.4 cm
Weight: 2515.01 [oz_av]

## 2024-06-18 LAB — HIV ANTIBODY (ROUTINE TESTING W REFLEX): HIV Screen 4th Generation wRfx: NONREACTIVE

## 2024-06-18 MED ORDER — DABIGATRAN ETEXILATE MESYLATE 150 MG PO CAPS: 150.0000 mg | ORAL_CAPSULE | Freq: Two times a day (BID) | ORAL | 0 refills | Status: DC

## 2024-06-18 MED ORDER — METOPROLOL SUCCINATE ER 25 MG PO TB24: 12.5000 mg | ORAL_TABLET | Freq: Every day | ORAL | 0 refills | Status: AC

## 2024-06-18 MED ORDER — OXYCODONE HCL 5 MG PO TABS: 5.0000 mg | ORAL_TABLET | ORAL | 0 refills | Status: AC | PRN

## 2024-06-18 NOTE — Discharge Summary (Signed)
 Physician Discharge Summary  Valerie Duran FMW:[id number] DOB: [birth year] DOA: 06/17/2024  PCP: Diedra Lame, MD  Admit date: 06/17/2024 Discharge date: 06/18/2024  Admitted From: Home Disposition:  Home  Recommendations for Outpatient Follow-up:  Follow up with PCP in 1-2 weeks   Home Health:No  Equipment/Devices:None   Discharge Condition:Stable  CODE STATUS:FULL  Diet recommendation: Heart  Brief/Interim Summary: Valerie Duran is a 78 y.o. Caucasianfemale with medical history significant for hypothyroidism, depression, GERD, osteoarthritis, prediabetes and RLS, presented to the emergency room with acute onset of fall with subsequent left wrist pain.  The patient stated that she had lightheadedness before falling but never passed out.  She caught herself with her left hand.  No head injuries.  No paresthesias or focal muscle weakness.  No witnessed seizures.  No chest pain or palpitations.  No cough or wheezing or hemoptysis.  She vomited after falling.  No diarrhea or melena or bright red bleeding per rectum.  No bilious vomitus or hematemesis.  No leg pain or recent travels or surgeries.     Discharge Diagnoses:  Principal Problem:   Atrial fibrillation with RVR (HCC) Active Problems:   Controlled type 2 diabetes mellitus without complication, without long-term current use of insulin  (HCC)   Fall at home, initial encounter   Hypothyroidism   Restless leg syndrome   Depression   Closed fracture of left distal radius  * Atrial fibrillation with RVR (HCC) Patient spontaneously converted.  Discussed with cardiology.  Maintaining sinus rhythm today.  Continue Toprol -XL and oral anticoagulation.  Prescribed generic dabigatran  given prohibitive cost of Eliquis .  Follow-up with primary cardiologist as outpatient.  Fall at home, initial encounter Left radial fracture Unclear etiology.  Unable to exclude presyncopal event as a result of her atrial fibrillation.   Subsequent left rib fracture fracture splinted in ED.  Outpatient orthopedic follow-up recommended.  Referral provided at discharge.    Discharge Instructions  Discharge Instructions     AMB referral to orthopedics   Complete by: As directed    Amb referral to [facility]   Complete by: As directed    Increase activity slowly   Complete by: As directed       Allergies as of 06/18/2024       Reactions   Ambien  [zolpidem  Tartrate] Other (See Comments)   Altered mental status   Barley Grass Other (See Comments)   Celiac disease   Wheat Rash   Celiac disease   Tape Other (See Comments)   Adhesive tape - tears skin   Codeine Nausea Only   Ibuprofen Other (See Comments), Rash   GI upset  makes my stomach hurt   Simvastatin Rash   Valium  [diazepam ] Other (See Comments)   Sleep walks        Medication List     STOP taking these medications    amoxicillin 500 MG capsule Commonly known as: AMOXIL   Biotin  5000 MCG Caps   cyclobenzaprine  10 MG tablet Commonly known as: FLEXERIL    Mag-200 200 MG Tabs Generic drug: Magnesium  Oxide -Mg Supplement       TAKE these medications    pramipexole  1 MG tablet Commonly known as: MIRAPEX  Take 1 mg by mouth 2 (two) times daily. The timing of this medication is very important.   acetaminophen  650 MG CR tablet Commonly known as: TYLENOL  Take 650-1,300 mg by mouth every 8 (eight) hours as needed for pain.   citalopram  20 MG tablet Commonly known as: CELEXA  Take 20  mg by mouth every evening.   dabigatran  150 MG Caps capsule Commonly known as: PRADAXA  Take 1 capsule (150 mg total) by mouth 2 (two) times daily.   docusate sodium  100 MG capsule Commonly known as: COLACE Take 1 capsule (100 mg total) by mouth 2 (two) times daily. What changed:  when to take this reasons to take this   fexofenadine  180 MG tablet Commonly known as: ALLEGRA  Take 180 mg by mouth daily as needed (allergies.).   fluticasone  50  MCG/ACT nasal spray Commonly known as: FLONASE  Place 1-2 sprays into both nostrils daily as needed (allergies/congestion.).   furosemide  20 MG tablet Commonly known as: LASIX  Take 20 mg by mouth daily as needed for fluid.   Glucosamine Sulfate 1000 MG Caps Take 2,000 mg by mouth daily.   levothyroxine  88 MCG tablet Commonly known as: SYNTHROID  Take 88 mcg by mouth daily before breakfast.   Lubricant Eye Drops 0.4-0.3 % Soln Generic drug: Polyethyl Glycol-Propyl Glycol Place 1 drop into both eyes 3 (three) times daily as needed (dry/irritated eyes.).   metFORMIN  500 MG 24 hr tablet Commonly known as: GLUCOPHAGE -XR Take 500 mg by mouth 2 (two) times daily with a meal.   metoprolol  succinate 25 MG 24 hr tablet Commonly known as: TOPROL -XL Take 0.5 tablets (12.5 mg total) by mouth daily.   ondansetron  4 MG disintegrating tablet Commonly known as: ZOFRAN -ODT Take 4 mg by mouth every 8 (eight) hours as needed for nausea.   oxyCODONE  5 MG immediate release tablet Commonly known as: Oxy IR/ROXICODONE  Take 1 tablet (5 mg total) by mouth every 4 (four) hours as needed for severe pain (pain score 7-10) ((score 7 to 10)). What changed:  medication strength how much to take        Allergies[1]  Consultations: Cardiology   Procedures/Studies: ECHOCARDIOGRAM COMPLETE Result Date: 06/18/2024    ECHOCARDIOGRAM REPORT   Patient Name:   Valerie Duran Date of Exam: 06/18/2024 Medical Rec #:  [id number]         Height:       67.0 in Accession #:    [id number]        Weight:       157.2 lb Date of Birth:  1945-12-09        BSA:          1.825 m Patient Age:    78 years          BP:           165/81 mmHg Patient Gender: F                 HR:           90 bpm. Exam Location:  [facility] Procedure: 2D Echo, 3D Echo, Color Doppler, Cardiac Doppler and Strain Analysis            (Both Spectral and Color Flow Doppler were utilized during            procedure). Indications:     Atrial  Fibrillation  History:         Patient has no prior history of Echocardiogram examinations.                  Arrythmias:Atrial Fibrillation, Signs/Symptoms:Chest Pain; Risk                  Factors:Diabetes and Hypothyroidism.  Sonographer:     Logan Shove RDCS Referring Phys:  [address] [place] Diagnosing Phys: Redell Cave MD  IMPRESSIONS  1. Left ventricular ejection fraction, by estimation, is 60 to 65%. The left ventricle has normal function. The left ventricle has no regional wall motion abnormalities. Left ventricular diastolic parameters were normal. The average left ventricular global longitudinal strain is -19.6 %. The global longitudinal strain is normal.  2. Right ventricular systolic function is normal. The right ventricular size is normal.  3. The mitral valve is normal in structure. Mild mitral valve regurgitation.  4. The aortic valve is tricuspid. Aortic valve regurgitation is not visualized.  5. The inferior vena cava is dilated in size with <50% respiratory variability, suggesting right atrial pressure of 15 mmHg. FINDINGS  Left Ventricle: Left ventricular ejection fraction, by estimation, is 60 to 65%. The left ventricle has normal function. The left ventricle has no regional wall motion abnormalities. The average left ventricular global longitudinal strain is -19.6 %. Strain was performed and the global longitudinal strain is normal. The left ventricular internal cavity size was normal in size. There is no left ventricular hypertrophy. Left ventricular diastolic parameters were normal. Right Ventricle: The right ventricular size is normal. No increase in right ventricular wall thickness. Right ventricular systolic function is normal. Left Atrium: Left atrial size was normal in size. Right Atrium: Right atrial size was normal in size. Pericardium: There is no evidence of pericardial effusion. Mitral Valve: The mitral valve is normal in structure. Mild mitral valve regurgitation.  Tricuspid Valve: The tricuspid valve is normal in structure. Tricuspid valve regurgitation is trivial. Aortic Valve: The aortic valve is tricuspid. Aortic valve regurgitation is not visualized. Aortic valve peak gradient measures 6.9 mmHg. Pulmonic Valve: The pulmonic valve was grossly normal. Pulmonic valve regurgitation is not visualized. Aorta: The aortic root and ascending aorta are structurally normal, with no evidence of dilitation. Venous: The inferior vena cava is dilated in size with less than 50% respiratory variability, suggesting right atrial pressure of 15 mmHg. IAS/Shunts: No atrial level shunt detected by color flow Doppler.  LEFT VENTRICLE PLAX 2D LVIDd:         4.40 cm   Diastology LVIDs:         2.40 cm   LV e' medial:    8.38 cm/s LV PW:         0.70 cm   LV E/e' medial:  12.9 LV IVS:        0.70 cm   LV e' lateral:   11.10 cm/s LVOT diam:     2.20 cm   LV E/e' lateral: 9.7 LVOT Area:     3.80 cm LV IVRT:       67 msec   2D Longitudinal Strain                          2D Strain GLS Avg:     -19.6 %                           3D Volume EF:                          3D EF:        63 %                          LV EDV:       127 ml  LV ESV:       46 ml                          LV SV:        80 ml RIGHT VENTRICLE             IVC RV Basal diam:  3.70 cm     IVC diam: 2.30 cm RV S prime:     12.30 cm/s TAPSE (M-mode): 2.4 cm LEFT ATRIUM             Index        RIGHT ATRIUM           Index LA diam:        3.90 cm 2.14 cm/m   RA Area:     15.20 cm LA Vol (A2C):   77.4 ml 42.40 ml/m  RA Volume:   37.90 ml  20.76 ml/m LA Vol (A4C):   51.4 ml 28.16 ml/m LA Biplane Vol: 66.7 ml 36.54 ml/m  AORTIC VALVE AV Area (Vmax): 3.08 cm AV Vmax:        131.00 cm/s AV Peak Grad:   6.9 mmHg LVOT Vmax:      106.00 cm/s  AORTA Ao Root diam: 3.10 cm Ao Asc diam:  2.80 cm MITRAL VALVE MV Area (PHT): 3.48 cm     SHUNTS MV Decel Time: 218 msec     Systemic Diam: 2.20 cm MV E velocity: 108.00 cm/s  MV A velocity: 122.00 cm/s MV E/A ratio:  0.89 Redell Cave MD Electronically signed by Redell Cave MD Signature Date/Time: 06/18/2024/10:11:42 AM    Final    CT CHEST WO CONTRAST Result Date: 06/17/2024 CLINICAL DATA:  Abnormal chest x-ray. EXAM: CT CHEST WITHOUT CONTRAST TECHNIQUE: Multidetector CT imaging of the chest was performed following the standard protocol without IV contrast. RADIATION DOSE REDUCTION: This exam was performed according to the departmental dose-optimization program which includes automated exposure control, adjustment of the mA and/or kV according to patient size and/or use of iterative reconstruction technique. COMPARISON:  06/17/2024. FINDINGS: Cardiovascular: The heart is enlarged and there is no pericardial effusion. There is atherosclerotic calcification of the aorta without evidence of aneurysm. Pulmonary trunk is normal in caliber. Mediastinum/Nodes: No mediastinal or axillary lymphadenopathy. Evaluation of the hila is limited due to lack of IV contrast. The thyroid  gland, trachea, and esophagus are within normal limits. Lungs/Pleura: Pleural and parenchymal scarring is noted at the lung apices. Mild atelectasis is noted bilaterally. No effusion or pneumothorax is seen. No significant pulmonary nodule or mass is seen. Upper Abdomen: No acute abnormality. Musculoskeletal: Degenerative changes are present in the thoracic spine. Thoracolumbar spinal fusion hardware is noted. No acute osseous abnormality. IMPRESSION: 1. Apical pleural scarring and atelectasis bilaterally. No acute infiltrate is seen. 2. Aortic atherosclerosis. Electronically Signed   By: Leita Birmingham M.D.   On: 06/17/2024 17:39   CT ABDOMEN PELVIS W CONTRAST Result Date: 06/17/2024 CLINICAL DATA:  Weight loss with intermittent nausea and vomiting. Early satiety. EXAM: CT ABDOMEN AND PELVIS WITH CONTRAST TECHNIQUE: Multidetector CT imaging of the abdomen and pelvis was performed using the standard  protocol following bolus administration of intravenous contrast. RADIATION DOSE REDUCTION: This exam was performed according to the departmental dose-optimization program which includes automated exposure control, adjustment of the mA and/or kV according to patient size and/or use of iterative reconstruction technique. CONTRAST:  OMNIPAQUE  IOHEXOL  300 MG/ML  SOLN COMPARISON:  None available  FINDINGS: Lower chest: No acute abnormality. Hepatobiliary: Liver is normal in appearance. Mild thickening of the gallbladder wall with mild pericholecystic fluid. No significant bile duct dilatation. Pancreas: 1.1 cm exophytic low-density mass seen extending anteriorly from the pancreatic tail (image 23, series 2). No pancreatic duct dilatation. Spleen: Normal in size without focal abnormality. Adrenals/Urinary Tract: 10 mm simple cyst in the lower pole of the RIGHT kidney does not require dedicated imaging follow-up. 5 mm low-density structure at the mid RIGHT kidney is too small to fully characterize, however statistically most likely represents a benign simple cyst and does not require dedicated imaging surveillance. If no hydronephrosis or hydroureter. Adrenal glands, ureters, and bladder are unremarkable. Evaluation of retroperitoneal structures are limited due to streak artifact. Stomach/Bowel: No dilated loops of bowel to indicate ileus or obstruction. Distal colonic diverticulosis without evidence of acute diverticulitis. Appendix is normal. Vascular/Lymphatic: Aortic atherosclerosis. No enlarged abdominal or pelvic lymph nodes. Reproductive: Unable to evaluate due to streak artifact from RIGHT total hip prosthesis. Other: No abdominal wall hernia or abnormality. No abdominopelvic ascites. Musculoskeletal: Long segment fusion of the spine and pelvis is seen. No evidence of hardware complication. Streak artifact from extensive spinal hardware limits evaluation of retroperitoneal structures. IMPRESSION: 1. Mild  thickening of the gallbladder wall with mild pericholecystic fluid, suspicious for cholecystitis. Further evaluation with right upper quadrant ultrasound should be performed if the patient has right upper quadrant pain. 2. 1.1 cm exophytic low-density mass extending anteriorly from the pancreatic tail. Further evaluation with nonemergent contrast-enhanced MRI is recommended. This should be preferably performed as outpatient for the patient is better able to suspend respiration. 3. Distal colonic diverticulosis without evidence of acute diverticulitis. Aortic Atherosclerosis (ICD10-I70.0). Electronically Signed   By: Aliene Lloyd M.D.   On: 06/17/2024 15:00   DG Chest Port 1 View Result Date: 06/17/2024 CLINICAL DATA:  Weight loss. EXAM: PORTABLE CHEST 1 VIEW COMPARISON:  08/06/2014 FINDINGS: Cardiopericardial silhouette is at upper limits of normal for size. Asymmetric hazy opacities identified over the right upper lung. Lungs otherwise clear. No acute bony abnormality. Telemetry leads overlie the chest. IMPRESSION: Asymmetric hazy opacities over the right upper lung in this patient with a reported history of weight loss. Chest CT without contrast recommended to further evaluate Electronically Signed   By: Camellia Candle M.D.   On: 06/17/2024 09:47   DG Wrist Complete Left Result Date: 06/17/2024 EXAM: 3 OR MORE VIEW(S) XRAY OF THE LEFT WRIST 06/17/2024 12:49:00 AM COMPARISON: None available. CLINICAL HISTORY: fall FINDINGS: BONES AND JOINTS: Acute nondisplaced intra-articular fracture of distal radial metaphysis with the dominant fracture component extending transversely with mild impaction, dorsal buckling, and resultant mild dorsal tilt of the distal radial articular surface. At least 1 fracture plane is seen extending into the radial styloid without significant articular incongruity. Acute minimally displaced ulnar styloid fracture. Advanced first CMC degenerative changes. SOFT TISSUES: Subcutaneous soft  tissue edema. IMPRESSION: 1. Acute nondisplaced intra-articular fracture of the distal radial metaphysis with mild impaction, dorsal buckling, and mild dorsal tilt of the distal radial articular surface, with at least one fracture plane extending into the radial styloid without significant articular incongruity. 2. Acute minimally displaced ulnar styloid fracture. 3. Subcutaneous soft tissue edema. 4. Advanced first CMC degenerative changes. Electronically signed by: Dorethia Molt MD 06/17/2024 12:56 AM EST RP Workstation: [id number]      Subjective: Seen and examined the day of discharge.  Stable no distress.  Appropriate for discharge home.  Discharge Exam: Vitals:   06/18/24  0600 06/18/24 0630  BP: 131/60 116/81  Pulse: 65 76  Resp: 19 19  Temp:    SpO2: 96% 96%   Vitals:   06/18/24 0500 06/18/24 0530 06/18/24 0600 06/18/24 0630  BP: (!) 165/67 117/69 131/60 116/81  Pulse: 77 73 65 76  Resp: 19 17 19 19   Temp: 97.9 F (36.6 C)     TempSrc: Oral     SpO2: 97% 94% 96% 96%  Weight:      Height:        General: Pt is alert, awake, not in acute distress Cardiovascular: RRR, S1/S2 +, no rubs, no gallops Respiratory: CTA bilaterally, no wheezing, no rhonchi Abdominal: Soft, NT, ND, bowel sounds + Extremities: no edema, no cyanosis    The results of significant diagnostics from this hospitalization (including imaging, microbiology, ancillary and laboratory) are listed below for reference.     Microbiology: Recent Results (from the past 240 hours)  Resp panel by RT-PCR (RSV, Flu A&B, Covid) Anterior Nasal Swab     Status: None   Collection Time: 06/17/24 12:45 AM   Specimen: Anterior Nasal Swab  Result Value Ref Range Status   SARS Coronavirus 2 by RT PCR NEGATIVE NEGATIVE Final    Comment: (NOTE) SARS-CoV-2 target nucleic acids are NOT DETECTED.  The SARS-CoV-2 RNA is generally detectable in upper respiratory specimens during the acute phase of infection. The  lowest concentration of SARS-CoV-2 viral copies this assay can detect is 138 copies/mL. A negative result does not preclude SARS-Cov-2 infection and should not be used as the sole basis for treatment or other patient management decisions. A negative result may occur with  improper specimen collection/handling, submission of specimen other than nasopharyngeal swab, presence of viral mutation(s) within the areas targeted by this assay, and inadequate number of viral copies(<138 copies/mL). A negative result must be combined with clinical observations, patient history, and epidemiological information. The expected result is Negative.  Fact Sheet for Patients:  bloggercourse.com  Fact Sheet for Healthcare Providers:  seriousbroker.it  This test is no t yet approved or cleared by the [place]  FDA and  has been authorized for detection and/or diagnosis of SARS-CoV-2 by FDA under an Emergency Use Authorization (EUA). This EUA will remain  in effect (meaning this test can be used) for the duration of the COVID-19 declaration under Section 564(b)(1) of the Act, 21 U.S.C.section 360bbb-3(b)(1), unless the authorization is terminated  or revoked sooner.       Influenza A by PCR NEGATIVE NEGATIVE Final   Influenza B by PCR NEGATIVE NEGATIVE Final    Comment: (NOTE) The Xpert Xpress SARS-CoV-2/FLU/RSV plus assay is intended as an aid in the diagnosis of influenza from Nasopharyngeal swab specimens and should not be used as a sole basis for treatment. Nasal washings and aspirates are unacceptable for Xpert Xpress SARS-CoV-2/FLU/RSV testing.  Fact Sheet for Patients: bloggercourse.com  Fact Sheet for Healthcare Providers: seriousbroker.it  This test is not yet approved or cleared by the [place]  FDA and has been authorized for detection and/or diagnosis of SARS-CoV-2 by FDA under  an Emergency Use Authorization (EUA). This EUA will remain in effect (meaning this test can be used) for the duration of the COVID-19 declaration under Section 564(b)(1) of the Act, 21 U.S.C. section 360bbb-3(b)(1), unless the authorization is terminated or revoked.     Resp Syncytial Virus by PCR NEGATIVE NEGATIVE Final    Comment: (NOTE) Fact Sheet for Patients: bloggercourse.com  Fact Sheet for Healthcare Providers: seriousbroker.it  This  test is not yet approved or cleared by the [place]  FDA and has been authorized for detection and/or diagnosis of SARS-CoV-2 by FDA under an Emergency Use Authorization (EUA). This EUA will remain in effect (meaning this test can be used) for the duration of the COVID-19 declaration under Section 564(b)(1) of the Act, 21 U.S.C. section 360bbb-3(b)(1), unless the authorization is terminated or revoked.  Performed at [facility], [address] [place], [place] [address]      Labs: BNP (last 3 results) No results for input(s): BNP in the last 8760 hours. Basic Metabolic Panel: Recent Labs  Lab 06/17/24 0045 06/17/24 0430  NA 136 136  K 4.1 3.7  CL 101 104  CO2 23 22  GLUCOSE 198* 192*  BUN 13 11  CREATININE 0.74 0.51  CALCIUM  9.3 8.4*  MG  --  1.8   Liver Function Tests: Recent Labs  Lab 06/17/24 0430  AST 21  ALT 10  ALKPHOS 64  BILITOT 0.5  PROT 5.8*  ALBUMIN  3.2*   No results for input(s): LIPASE, AMYLASE in the last 168 hours. No results for input(s): AMMONIA in the last 168 hours. CBC: Recent Labs  Lab 06/17/24 0045 06/17/24 0430 06/18/24 0433  WBC 7.7 7.1 6.5  NEUTROABS  --   --  3.8  HGB 12.8 11.2* 12.5  HCT 38.9 33.7* 39.7  MCV 91.5 91.8 95.9  PLT 250 231 253   Cardiac Enzymes: No results for input(s): CKTOTAL, CKMB, CKMBINDEX, TROPONINI in the last 168 hours. BNP: Invalid input(s): POCBNP CBG: Recent Labs  Lab  06/17/24 0851 06/17/24 1138 06/17/24 1802 06/17/24 2201 06/18/24 0759  GLUCAP 170* 179* 173* 73 135*   D-Dimer No results for input(s): DDIMER in the last 72 hours. Hgb A1c Recent Labs    06/17/24 0430  HGBA1C 7.2*   Lipid Profile No results for input(s): CHOL, HDL, LDLCALC, TRIG, CHOLHDL, LDLDIRECT in the last 72 hours. Thyroid  function studies Recent Labs    06/17/24 0430  TSH 5.870*   Anemia work up No results for input(s): VITAMINB12, FOLATE, FERRITIN, TIBC, IRON , RETICCTPCT in the last 72 hours. Urinalysis    Component Value Date/Time   COLORURINE YELLOW (A) 06/17/2024 1726   APPEARANCEUR CLEAR (A) 06/17/2024 1726   APPEARANCEUR Clear 01/18/2012 0930   LABSPEC 1.045 (H) 06/17/2024 1726   LABSPEC 1.022 01/18/2012 0930   PHURINE 5.0 06/17/2024 1726   GLUCOSEU NEGATIVE 06/17/2024 1726   GLUCOSEU >=500 01/18/2012 0930   HGBUR SMALL (A) 06/17/2024 1726   BILIRUBINUR NEGATIVE 06/17/2024 1726   BILIRUBINUR Negative 01/18/2012 0930   KETONESUR NEGATIVE 06/17/2024 1726   PROTEINUR NEGATIVE 06/17/2024 1726   NITRITE NEGATIVE 06/17/2024 1726   LEUKOCYTESUR NEGATIVE 06/17/2024 1726   LEUKOCYTESUR 1+ 01/18/2012 0930   Sepsis Labs Recent Labs  Lab 06/17/24 0045 06/17/24 0430 06/18/24 0433  WBC 7.7 7.1 6.5   Microbiology Recent Results (from the past 240 hours)  Resp panel by RT-PCR (RSV, Flu A&B, Covid) Anterior Nasal Swab     Status: None   Collection Time: 06/17/24 12:45 AM   Specimen: Anterior Nasal Swab  Result Value Ref Range Status   SARS Coronavirus 2 by RT PCR NEGATIVE NEGATIVE Final    Comment: (NOTE) SARS-CoV-2 target nucleic acids are NOT DETECTED.  The SARS-CoV-2 RNA is generally detectable in upper respiratory specimens during the acute phase of infection. The lowest concentration of SARS-CoV-2 viral copies this assay can detect is 138 copies/mL. A negative result does not preclude SARS-Cov-2  infection and should not be  used as the sole basis for treatment or other patient management decisions. A negative result may occur with  improper specimen collection/handling, submission of specimen other than nasopharyngeal swab, presence of viral mutation(s) within the areas targeted by this assay, and inadequate number of viral copies(<138 copies/mL). A negative result must be combined with clinical observations, patient history, and epidemiological information. The expected result is Negative.  Fact Sheet for Patients:  bloggercourse.com  Fact Sheet for Healthcare Providers:  seriousbroker.it  This test is no t yet approved or cleared by the [place]  FDA and  has been authorized for detection and/or diagnosis of SARS-CoV-2 by FDA under an Emergency Use Authorization (EUA). This EUA will remain  in effect (meaning this test can be used) for the duration of the COVID-19 declaration under Section 564(b)(1) of the Act, 21 U.S.C.section 360bbb-3(b)(1), unless the authorization is terminated  or revoked sooner.       Influenza A by PCR NEGATIVE NEGATIVE Final   Influenza B by PCR NEGATIVE NEGATIVE Final    Comment: (NOTE) The Xpert Xpress SARS-CoV-2/FLU/RSV plus assay is intended as an aid in the diagnosis of influenza from Nasopharyngeal swab specimens and should not be used as a sole basis for treatment. Nasal washings and aspirates are unacceptable for Xpert Xpress SARS-CoV-2/FLU/RSV testing.  Fact Sheet for Patients: bloggercourse.com  Fact Sheet for Healthcare Providers: seriousbroker.it  This test is not yet approved or cleared by the [place]  FDA and has been authorized for detection and/or diagnosis of SARS-CoV-2 by FDA under an Emergency Use Authorization (EUA). This EUA will remain in effect (meaning this test can be used) for the duration of the COVID-19 declaration under Section  564(b)(1) of the Act, 21 U.S.C. section 360bbb-3(b)(1), unless the authorization is terminated or revoked.     Resp Syncytial Virus by PCR NEGATIVE NEGATIVE Final    Comment: (NOTE) Fact Sheet for Patients: bloggercourse.com  Fact Sheet for Healthcare Providers: seriousbroker.it  This test is not yet approved or cleared by the [place]  FDA and has been authorized for detection and/or diagnosis of SARS-CoV-2 by FDA under an Emergency Use Authorization (EUA). This EUA will remain in effect (meaning this test can be used) for the duration of the COVID-19 declaration under Section 564(b)(1) of the Act, 21 U.S.C. section 360bbb-3(b)(1), unless the authorization is terminated or revoked.  Performed at [facility], [address] [place], [place] [address]      Time coordinating discharge: 40 minutes  SIGNED:   Calvin KATHEE Robson, MD  [facility] 06/18/2024, 4:54 PM Pager   If 7PM-7AM, please contact night-coverage     [1]  Allergies Allergen Reactions   Ambien  [Zolpidem  Tartrate] Other (See Comments)    Altered mental status   Barley Grass Other (See Comments)    Celiac disease   Wheat Rash    Celiac disease   Tape Other (See Comments)    Adhesive tape - tears skin   Codeine Nausea Only   Ibuprofen Other (See Comments) and Rash    GI upset  makes my stomach hurt   Simvastatin Rash   Valium  [Diazepam ] Other (See Comments)    Sleep walks

## 2024-06-18 NOTE — Progress Notes (Signed)
 Echocardiogram 2D Echocardiogram has been performed.  Molley Houser N Coltan Spinello,RDCS 06/18/2024, 10:03 AM

## 2024-06-18 NOTE — ED Notes (Signed)
 Pt was ambulatory to the bathroom with her cane

## 2024-06-18 NOTE — Progress Notes (Signed)
 "  Cardiology Progress Note   Patient Name: Valerie Duran Date of Encounter: 06/18/2024  Primary Cardiologist: Evalene Lunger, MD  Subjective   Having some left wrist pain but otherwise feels well.  She was a little bit lightheaded when she stood up this morning but lightheadedness has otherwise resolved.  No chest pain, dyspnea, or palpitations.  Maintaining sinus rhythm. Objective   Inpatient Medications    Scheduled Meds:  apixaban   5 mg Oral BID   citalopram   40 mg Oral QPM   insulin  aspart  0-15 Units Subcutaneous TID WC   insulin  aspart  0-5 Units Subcutaneous QHS   levothyroxine   88 mcg Oral Q0600   magnesium  oxide  200 mg Oral Daily   metoprolol  succinate  12.5 mg Oral Daily   polyethylene glycol  17 g Oral Daily   pramipexole   1 mg Oral BID   Continuous Infusions:  PRN Meds: acetaminophen , magnesium  hydroxide, ondansetron  (ZOFRAN ) IV, mouth rinse, oxyCODONE , traZODone    Vital Signs    Vitals:   06/18/24 0500 06/18/24 0530 06/18/24 0600 06/18/24 0630  BP: (!) 165/67 117/69 131/60 116/81  Pulse: 77 73 65 76  Resp: 19 17 19 19   Temp: 97.9 F (36.6 C)     TempSrc: Oral     SpO2: 97% 94% 96% 96%  Weight:      Height:        Intake/Output Summary (Last 24 hours) at 06/18/2024 0742 Last data filed at 06/17/2024 1110 Gross per 24 hour  Intake 95.46 ml  Output 480 ml  Net -384.54 ml   Filed Weights   06/17/24 0211  Weight: 71.3 kg    Physical Exam   GEN: Well nourished, well developed, in no acute distress.  HEENT: Grossly normal.  Neck: Supple, no JVD, carotid bruits, or masses. Cardiac: RRR, no murmurs, rubs, or gallops. No clubbing, cyanosis, edema.  Radials 2+, DP/PT 2+ and equal bilaterally.  Respiratory:  Respirations regular and unlabored, diminished breath sounds at bilateral bases. GI: Soft, nontender, nondistended, BS + x 4. MS: no deformity or atrophy. Skin: warm and dry, no rash. Neuro:  Strength and sensation are intact. Psych:  AAOx3.  Normal affect.  Labs    Chemistry Recent Labs  Lab 06/17/24 0045 06/17/24 0430  NA 136 136  K 4.1 3.7  CL 101 104  CO2 23 22  GLUCOSE 198* 192*  BUN 13 11  CREATININE 0.74 0.51  CALCIUM  9.3 8.4*  PROT  --  5.8*  ALBUMIN   --  3.2*  AST  --  21  ALT  --  10  ALKPHOS  --  64  BILITOT  --  0.5  GFRNONAA >60 >60  ANIONGAP 12 10     Hematology Recent Labs  Lab 06/17/24 0045 06/17/24 0430 06/18/24 0433  WBC 7.7 7.1 6.5  RBC 4.25 3.67* 4.14  HGB 12.8 11.2* 12.5  HCT 38.9 33.7* 39.7  MCV 91.5 91.8 95.9  MCH 30.1 30.5 30.2  MCHC 32.9 33.2 31.5  RDW 12.2 12.2 12.3  PLT 250 231 253    Cardiac Enzymes  Recent Labs  Lab 06/17/24 0045 06/17/24 0430  TRNPT <15 <15  ]  BNP    Component Value Date/Time   BNP 127.0 (H) 07/19/2018 1520    HbA1c  Lab Results  Component Value Date   HGBA1C 7.2 (H) 06/17/2024   Lab Results  Component Value Date   TSH 5.870 (H) 06/17/2024    Radiology    CT  CHEST WO CONTRAST Result Date: 06/17/2024 CLINICAL DATA:  Abnormal chest x-ray. EXAM: CT CHEST WITHOUT CONTRAST TECHNIQUE: Multidetector CT imaging of the chest was performed following the standard protocol without IV contrast. RADIATION DOSE REDUCTION: This exam was performed according to the departmental dose-optimization program which includes automated exposure control, adjustment of the mA and/or kV according to patient size and/or use of iterative reconstruction technique. COMPARISON:  06/17/2024. FINDINGS: Cardiovascular: The heart is enlarged and there is no pericardial effusion. There is atherosclerotic calcification of the aorta without evidence of aneurysm. Pulmonary trunk is normal in caliber. Mediastinum/Nodes: No mediastinal or axillary lymphadenopathy. Evaluation of the hila is limited due to lack of IV contrast. The thyroid  gland, trachea, and esophagus are within normal limits. Lungs/Pleura: Pleural and parenchymal scarring is noted at the lung apices. Mild  atelectasis is noted bilaterally. No effusion or pneumothorax is seen. No significant pulmonary nodule or mass is seen. Upper Abdomen: No acute abnormality. Musculoskeletal: Degenerative changes are present in the thoracic spine. Thoracolumbar spinal fusion hardware is noted. No acute osseous abnormality. IMPRESSION: 1. Apical pleural scarring and atelectasis bilaterally. No acute infiltrate is seen. 2. Aortic atherosclerosis. Electronically Signed   By: Leita Birmingham M.D.   On: 06/17/2024 17:39   CT ABDOMEN PELVIS W CONTRAST Result Date: 06/17/2024 CLINICAL DATA:  Weight loss with intermittent nausea and vomiting. Early satiety. EXAM: CT ABDOMEN AND PELVIS WITH CONTRAST TECHNIQUE: Multidetector CT imaging of the abdomen and pelvis was performed using the standard protocol following bolus administration of intravenous contrast. RADIATION DOSE REDUCTION: This exam was performed according to the departmental dose-optimization program which includes automated exposure control, adjustment of the mA and/or kV according to patient size and/or use of iterative reconstruction technique. CONTRAST:  OMNIPAQUE  IOHEXOL  300 MG/ML  SOLN COMPARISON:  None available FINDINGS: Lower chest: No acute abnormality. Hepatobiliary: Liver is normal in appearance. Mild thickening of the gallbladder wall with mild pericholecystic fluid. No significant bile duct dilatation. Pancreas: 1.1 cm exophytic low-density mass seen extending anteriorly from the pancreatic tail (image 23, series 2). No pancreatic duct dilatation. Spleen: Normal in size without focal abnormality. Adrenals/Urinary Tract: 10 mm simple cyst in the lower pole of the RIGHT kidney does not require dedicated imaging follow-up. 5 mm low-density structure at the mid RIGHT kidney is too small to fully characterize, however statistically most likely represents a benign simple cyst and does not require dedicated imaging surveillance. If no hydronephrosis or hydroureter.  Adrenal glands, ureters, and bladder are unremarkable. Evaluation of retroperitoneal structures are limited due to streak artifact. Stomach/Bowel: No dilated loops of bowel to indicate ileus or obstruction. Distal colonic diverticulosis without evidence of acute diverticulitis. Appendix is normal. Vascular/Lymphatic: Aortic atherosclerosis. No enlarged abdominal or pelvic lymph nodes. Reproductive: Unable to evaluate due to streak artifact from RIGHT total hip prosthesis. Other: No abdominal wall hernia or abnormality. No abdominopelvic ascites. Musculoskeletal: Long segment fusion of the spine and pelvis is seen. No evidence of hardware complication. Streak artifact from extensive spinal hardware limits evaluation of retroperitoneal structures. IMPRESSION: 1. Mild thickening of the gallbladder wall with mild pericholecystic fluid, suspicious for cholecystitis. Further evaluation with right upper quadrant ultrasound should be performed if the patient has right upper quadrant pain. 2. 1.1 cm exophytic low-density mass extending anteriorly from the pancreatic tail. Further evaluation with nonemergent contrast-enhanced MRI is recommended. This should be preferably performed as outpatient for the patient is better able to suspend respiration. 3. Distal colonic diverticulosis without evidence of acute diverticulitis. Aortic Atherosclerosis (  ICD10-I70.0). Electronically Signed   By: Aliene Lloyd M.D.   On: 06/17/2024 15:00   DG Chest Port 1 View Result Date: 06/17/2024 CLINICAL DATA:  Weight loss. EXAM: PORTABLE CHEST 1 VIEW COMPARISON:  08/06/2014 FINDINGS: Cardiopericardial silhouette is at upper limits of normal for size. Asymmetric hazy opacities identified over the right upper lung. Lungs otherwise clear. No acute bony abnormality. Telemetry leads overlie the chest. IMPRESSION: Asymmetric hazy opacities over the right upper lung in this patient with a reported history of weight loss. Chest CT without contrast  recommended to further evaluate Electronically Signed   By: Camellia Candle M.D.   On: 06/17/2024 09:47   DG Wrist Complete Left Result Date: 06/17/2024 EXAM: 3 OR MORE VIEW(S) XRAY OF THE LEFT WRIST 06/17/2024 12:49:00 AM COMPARISON: None available. CLINICAL HISTORY: fall FINDINGS: BONES AND JOINTS: Acute nondisplaced intra-articular fracture of distal radial metaphysis with the dominant fracture component extending transversely with mild impaction, dorsal buckling, and resultant mild dorsal tilt of the distal radial articular surface. At least 1 fracture plane is seen extending into the radial styloid without significant articular incongruity. Acute minimally displaced ulnar styloid fracture. Advanced first CMC degenerative changes. SOFT TISSUES: Subcutaneous soft tissue edema. IMPRESSION: 1. Acute nondisplaced intra-articular fracture of the distal radial metaphysis with mild impaction, dorsal buckling, and mild dorsal tilt of the distal radial articular surface, with at least one fracture plane extending into the radial styloid without significant articular incongruity. 2. Acute minimally displaced ulnar styloid fracture. 3. Subcutaneous soft tissue edema. 4. Advanced first CMC degenerative changes. Electronically signed by: Dorethia Molt MD 06/17/2024 12:56 AM EST RP Workstation: [id number]     Telemetry    Sinus rhythm- Personally Reviewed  Cardiac Studies   2D Echocardiogram - pending  Patient Profile     78 y.o. female with a history of diabetes, depression, fatigue, GERD, chronic low back pain, restless leg syndrome, osteoarthritis status post right total hip arthroplasty, anemia, and celiac disease who was admitted December 27 due to presyncope, fall, wrist fracture, and A-fib with RVR.  Assessment & Plan    1.  A-fib with RVR: Patient presented to the ED December 27 after episode of lightheadedness and fall resulting in left wrist fracture.  She was found to be in rapid A-fib and  subsequently converted to sinus rhythm on intravenous diltiazem  at approximately 3 AM on December 27.  She has had no recurrence of atrial fibrillation and is now on oral metoprolol  succinate as well as Eliquis  5 mg twice daily in the setting of a CHA2DS2-VASc of 4.  Await echo.  If felt stable for discharge prior to echo being performed, we could arrange for it to be done in the outpatient setting.  2.  Fall/left wrist fracture: Having some pain this morning.  Management per medicine team.  3.  Relative hypotension/presyncope: Patient was hypotensive on arrival in the setting of rapid atrial fibrillation though pressures remain soft even after conversion.  Pressures stable this morning on low-dose beta-blocker.  In setting of presyncope at home that prompted fall, will arrange for outpatient monitoring.  4.  Type 2 diabetes mellitus: A1c 7.2.  On metformin  at home.  Per medicine team.  6.  Abnormal chest x-ray: Asymmetric hazy opacities over the right lung with follow-up CT showing apical pleural scarring and atelectasis bilaterally without acute infiltrate.  7.?  Cholecystitis: CT of the abdomen with mild thickening the gallbladder wall with Abran cholecystic fluid suspicious for cholecystitis.  Patient denies abdominal pain.  Per medicine team.  8.  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  Per medicine/outpatient primary care.  9.  Hypothyroidism: On levothyroxine  as outpatient.  TSH mildly elevated at 5.870.  Per medicine.  Signed, Lonni Meager, NP  06/18/2024, 7:42 AM    For questions or updates, please contact   Please consult [email or web address] for contact info under Cardiology/STEMI.  "

## 2024-06-18 NOTE — Evaluation (Signed)
 Physical Therapy Evaluation Patient Details Name: Valerie Duran MRN: [id number] DOB: January 14, 1946 Today's Date: 06/18/2024  History of Present Illness  Valerie Duran is a 78 y.o. Caucasianfemale with medical history significant for hypothyroidism, depression, GERD, osteoarthritis, prediabetes and RLS, presented to the emergency room with acute onset of fall with subsequent left wrist pain. Admitted for afib with RVR. L wrist x-ray: Acute nondisplaced intra-articular fracture of the distal radial metaphysis and acute minimally displaced ulnar styloid fracture. Per ortho plan to splint and manage NWB with outpatient follow up.  Clinical Impression  Patient admitted with the above. PTA, patient lives with husband and was ambulatory in the home with use of furniture and use of rollator outside of the home. Patient presents with weakness, impaired balance, and decreased activity tolerance. Re-educated patient on NWB status of L wrist, however able to place weight through elbow if needed, patient verbalized understanding. Able to complete bed mobility modI. Stood and ambulated with use of cane with supervision for safety. Patient reports having stand up rollator at home and discussed potential use at home for more comfort and safety for patient. Patient will benefit from skilled PT services during acute stay to address listed deficits. Patient will benefit from ongoing therapy at discharge to maximize functional independence and safety.         If plan is discharge home, recommend the following: A little help with walking and/or transfers;A little help with bathing/dressing/bathroom;Assistance with cooking/housework;Help with stairs or ramp for entrance;Assist for transportation   Can travel by private vehicle        Equipment Recommendations None recommended by PT  Recommendations for Other Services       Functional Status Assessment Patient has had a recent decline in their functional  status and demonstrates the ability to make significant improvements in function in a reasonable and predictable amount of time.     Precautions / Restrictions Precautions Precautions: Fall Recall of Precautions/Restrictions: Intact Restrictions Weight Bearing Restrictions Per Provider Order: Yes LUE Weight Bearing Per Provider Order: Non weight bearing      Mobility  Bed Mobility Overal bed mobility: Modified Independent                  Transfers Overall transfer level: Needs assistance Equipment used: None Transfers: Sit to/from Stand Sit to Stand: Supervision                Ambulation/Gait Ambulation/Gait assistance: Supervision Gait Distance (Feet): 200 Feet Assistive device: Straight cane Gait Pattern/deviations: Step-to pattern, Decreased stride length Gait velocity: decreased     General Gait Details: decreased foot clearance bilaterally  Stairs            Wheelchair Mobility     Tilt Bed    Modified Rankin (Stroke Patients Only)       Balance Overall balance assessment: Needs assistance Sitting-balance support: No upper extremity supported, Feet supported Sitting balance-Leahy Scale: Normal     Standing balance support: No upper extremity supported, During functional activity Standing balance-Leahy Scale: Fair                               Pertinent Vitals/Pain Pain Assessment Pain Assessment: Faces Faces Pain Scale: Hurts even more Pain Location: L wrist Pain Descriptors / Indicators: Discomfort, Grimacing Pain Intervention(s): Monitored during session, Limited activity within patient's tolerance, Premedicated before session    Home Living Family/patient expects to be discharged to:: Private residence Living  Arrangements: Spouse/significant other Available Help at Discharge: Family;Available 24 hours/day Type of Home: House Home Access: Level entry       Home Layout: One level Home Equipment: Rollator  (4 wheels);Cane - quad (has stand up rollator as well) Additional Comments: reports plan for sister to come stay and assist    Prior Function Prior Level of Function : Independent/Modified Independent             Mobility Comments: endorses furniture walking in home, rollator in community. 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 who is mobile with RW       Extremity/Trunk Assessment   Upper Extremity Assessment Upper Extremity Assessment: Defer to OT evaluation    Lower Extremity Assessment Lower Extremity Assessment: Generalized weakness       Communication   Communication Communication: No apparent difficulties    Cognition Arousal: Alert Behavior During Therapy: WFL for tasks assessed/performed   PT - Cognitive impairments: No apparent impairments                         Following commands: Intact       Cueing Cueing Techniques: Verbal cues     General Comments      Exercises     Assessment/Plan    PT Assessment Patient needs continued PT services  PT Problem List Decreased strength;Decreased activity tolerance;Decreased balance;Decreased mobility       PT Treatment Interventions Gait training;DME instruction;Functional mobility training;Therapeutic activities;Therapeutic exercise;Balance training;Neuromuscular re-education;Patient/family education    PT Goals (Current goals can be found in the Care Plan section)  Acute Rehab PT Goals Patient Stated Goal: to go home PT Goal Formulation: With patient/family Time For Goal Achievement: 07/02/24 Potential to Achieve Goals: Good    Frequency Min 2X/week     Co-evaluation               AM-PAC PT 6 Clicks Mobility  Outcome Measure Help needed turning from your back to your side while in a flat bed without using bedrails?: None Help needed moving from lying on your back to sitting on the side of a flat bed without using bedrails?: None Help needed moving to and from a bed to a chair (including  a wheelchair)?: A Little Help needed standing up from a chair using your arms (e.g., wheelchair or bedside chair)?: A Little Help needed to walk in hospital room?: A Little Help needed climbing 3-5 steps with a railing? : A Little 6 Click Score: 20    End of Session   Activity Tolerance: Patient tolerated treatment well Patient left: in bed;with call bell/phone within reach;with family/visitor present (echo present) Nurse Communication: Mobility status PT Visit Diagnosis: Muscle weakness (generalized) (M62.81);Unsteadiness on feet (R26.81)    Time: 9080-9066 PT Time Calculation (min) (ACUTE ONLY): 14 min   Charges:   PT Evaluation $PT Eval Low Complexity: 1 Low   PT General Charges $$ ACUTE PT VISIT: 1 Visit         Maryanne Finder, PT, DPT Physical Therapist - [facility]  [facility]   Dorthea Maina A Sila Sarsfield 06/18/2024, 9:48 AM

## 2024-06-19 LAB — HCV AB W REFLEX TO QUANT PCR: HCV Ab: NONREACTIVE

## 2024-06-19 LAB — HCV INTERPRETATION

## 2024-06-27 NOTE — Progress Notes (Signed)
 [facility] Chief Complaint:   Chief Complaint  Patient presents with   Left Wrist - New Patient, Pain, Follow-up    ER QL:Rondzi fracture of distal end of left radius, unspecified fracture morphology, initial encounter; Traumatic closed fracture of ulnar styloid with minimal displacement, left, initial encounter     History of Present Illness:    Valerie Duran is a 79 y.o. female that presents to clinic today for evaluation and management of left distal radius fracture after a fall.  She is accompanied by her friend Jori.  At the time of this visit, I reviewed her recent evaluation by the [facility] emergency department after a fall which appears to be due to atrial fibrillation with RVR.  She suffered a left distal radius fracture.  She was admitted to the hospital and placed in a sugar-tong splint with recommendation of orthopedic follow-up.  Her most recent labs from 06/17/2024 show slight elevation of TSH, A1c 7.2, creatinine 0.51, normal electrolytes, normal CBC.  Her pain began after a fall at home on 06/17/2024.  She denies any previous accident, injury, trauma to this wrist.  There is previously reported history of right carpal tunnel syndrome in her chart.  She was found to have atrial fibrillation with RVR treated with dabigatran , metoprolol , and cardiology follow-up.  She was treated with a sugar-tong splint and has been using acetaminophen  for her symptoms.  She still notices some pain along her distal radius as well as some numbness focally in her palm.  She currently rates pain severity as a 8/10.  She reports associated swelling, numbness and tingling focally in her palm.  She denies associated locking/catching, weakness, fevers or chills, night sweats Koate loss, skin color change, pain at night.  She has tried sugar-tong splint, activity modification, acetaminophen .  She has been avoiding narcotics.  She is right hand dominant and is  retired.  She does have caretaking responsibilities for her husband with dementia.  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  She feels significant limitation in her function due to her current symptoms.  Medications, Past Medical/Surgical/Family/Social History:   Current Outpatient Medications  Medication Sig Dispense Refill   acetaminophen  (TYLENOL ) 650 MG ER tablet Take by mouth     amoxicillin (AMOXIL) 500 MG tablet Take 500 mg by mouth. Prior to dental procedures     citalopram  (CELEXA ) 40 MG tablet Take 1 tablet (40 mg total) by mouth once daily 90 tablet 1   fexofenadine  (ALLEGRA ) 180 MG tablet Take by mouth     GLUCOSAM SUL NA/CHONDR SU A NA (GLUCOSAMINE & CHONDROIT SUL.NA ORAL) Take 2 tablets by mouth once daily.     levothyroxine  (SYNTHROID ) 88 MCG tablet Take 1 tablet (88 mcg total) by mouth once daily Take on an empty stomach with a glass of water at least 30-60 minutes before breakfast. 90 tablet 1   magnesium  oxide 500 mg Cap Take 1 capsule by mouth 2 (two) times daily.     metFORMIN  (GLUCOPHAGE -XR) 500 MG XR tablet TAKE 2 TABLETS(1000 MG) BY MOUTH DAILY WITH DINNER 180 tablet 1   peg 400-propylene glycol (LUBRICANT EYE, PG-PEG 400,) 0.4-0.3 % drops Apply to eye     pramipexole  (MIRAPEX ) 1 MG tablet TAKE 1 TABLET(1 MG) BY MOUTH TWICE DAILY 180 tablet 1   No current facility-administered medications for this visit.    SECONDARY CONDITIONS THAT INFLUENCE TREATMENT AND DECISION-MAKING:  Non-smoker  Past Medical History: Overweight, atrial fibrillation on dabigatran , type  2 diabetes not on insulin , hypothyroid, depression, GERD, celiac disease  Past Surgical History: Cervical spine surgery  Relevant Orthopedic Family History: Arthritis  Review of Systems:   A 10+ ROS was performed, reviewed, and the pertinent orthopaedic findings are documented in the HPI.    Physical Examination:   BP 128/66   Ht 162.6 cm (5' 4.02)   Wt 73 kg (161 lb)   BMI 27.62 kg/m   General/Constitutional: Well-nourished, well developed, no apparent distress. Psych: Normal mood and affect.  Conversant.  Judgement intact. Lymphatic: No palpable adenopathy. Respiratory: Normal respirations, no retractions.  Cardiovascular: Regular rate with irregular rhythm.  No edema or swelling. Musculoskeletal: Comprehensive Wrist Exam: Inspection  Right Left  Alignment Neutral Neutral  Skin No rashes, lesions, or wounds. No ecchymosis or erythema. + faint ecchymosis over the distal radius and forearm.  No rashes, lesions, or wounds. No erythema.  Soft Tissue + mild thenar atrophy.  No focal soft tissue swelling. No obvious deformity.   + mild thenar atrophy.  No focal soft tissue swelling. No obvious deformity.   Forearm compartments soft and compressible.   Palpation   Right Left  Tenderness No tendon, muscle, bony pain. No snuff box tenderness.  No ulnar soft spot pain. + distal radius, ulnar styloid  No tendon, muscle pain.  Crepitus None None  Effusion No joint effusion No joint effusion   Range of Motion  Right Left  Pronation Full to 90 degrees Limited to 70 degrees with pain  Supination Full to 90 degrees Limited to 50 degrees with pain  Wrist Flexion Full to 70 degrees Limited to 40 degrees with pain  Wrist Extension Full to 70 degrees Limited to 30 degrees with pain   Strength  Right Left  Pronation 5/5 GWW  Supination 5/5 GWW  Wrist Flexion 5/5 GWW  Wrist Extension 5/5 GWW   Special Tests   Left  Tinel's Painful  Phalen's Unable to assess  Carpal compression Painful    Neurovascular   Right Left  Distal Motor Normal Normal  Distal Sensory Normal light touch sensation  Normal light touch sensation  Distal Pulses Normal Normal   Tests Reviewed/Performed/Ordered:  EXAM: Left Wrist Radiographs - 3 views (PA, Lateral, Oblique) performed 06/28/2023   CLINICAL INFORMATION: Left wrist pain after a fall   COMPARISON: Left Wrist Radiographs - 4 views  (PA, Lateral, Oblique, Scaphoid) performed 06/17/2024 at [facility]   FINDINGS:   Some slight dorsal tilt to the distal radius is minimally changed from previous x-rays.  Minimal displacement of ulnar styloid.  Relatively well-maintained radial height and inclination with comminuted intra-articular distal radius fracture.  Moderate-severe thumb CMC joint arthritis change with joint space narrowing, osteophyte formation, subchondral sclerosis, subchondral cystic change.  Severe right index finger PIP joint deformity could be sequela of previous injury since there is joint space narrowing, irregular shape of the articular surfaces, osteophyte formation, subchondral sclerosis, subchondral cystic change.  No scapholunate dissociation.  No dislocations.  No soft tissue swelling or joint effusion.  No loose bodies.  No abnormal bone lesions.   I personally reviewed and visualized the imaging studies if available. I additionally personally interpreted any radiographs taken during today's visit.  Assessment:     ICD-10-CM  1. Fall, initial encounter  W19.XXXA  2. Other closed intra-articular fracture of distal end of left radius, initial encounter  S52.572A  3. Closed nondisplaced fracture of styloid process of left ulna, initial encounter  S52.615A    Plan:   I  have discussed the nature of her current subjective complaints, clinical examination, test results and have reviewed treatment options.  The plan is to do the following;  - The patient has minimally displaced, intra-articular left distal radius fracture with also minimally displaced ulnar styloid fracture after a fall on 06/17/2024. - She does not have significant swelling around the area.  There is still tenderness around the distal radius and ulna.  Decreased motion with pain.  She does have some numbness in her palm focally with chronic thenar atrophy but no acute signs of carpal tunnel syndrome or compartment syndrome.  X-ray of the wrist shows  essentially stable alignment of her minimally displaced intra-articular fracture.  I discussed the nonsurgical treatment of her injury with cast immobilization and follow-up.   - She was placed in a short arm cast.  Nonweightbearing to the left upper extremity.  She was neurovascular intact after its placement.  She was educated on reasons to return urgently for an issue with the cast. - Use Tylenol , relative rest, and elevation as needed for pain.  She avoids NSAIDs due to being on dabigatran .  She is trying to avoid narcotic pain medicine. - Follow up in 1 week for reevaluation and x-ray inside her cast.  We will then determine if she needs to be in this cast for another 2 weeks.  Contact our office with any questions or concerns.  Follow up as indicated, or sooner should any new problems arise, if conditions worsen, or if they are otherwise concerned.    Prentice Reges, DO [facility] [address] [place], [place] [address] Phone: [phone]   This note was generated in part with voice recognition software and I apologize for any typographical errors that were not detected and corrected.

## 2024-07-11 ENCOUNTER — Telehealth (HOSPITAL_BASED_OUTPATIENT_CLINIC_OR_DEPARTMENT_OTHER): Payer: Self-pay

## 2024-07-11 NOTE — Telephone Encounter (Signed)
"  ° °  Pre-operative Risk Assessment    Patient Name: Valerie Duran  DOB: [birth year] MRN: [id number]   Date of last office visit: 03/01/18 with Perla  Date of next office visit: 07/13/24 with Riddle   Request for Surgical Clearance    Procedure:  Right Knee Scope and Synovectomy  Date of Surgery:  Clearance 08/29/24                                  Surgeon:  not indicated Surgeon's Group or Practice Name:  [facility] Phone number:  [phone] Fax number:  [phone]   Type of Clearance Requested:   - Medical    Type of Anesthesia:  choice   Additional requests/questions:    [name] [name] [name]   07/11/2024, 10:46 AM   "

## 2024-07-11 NOTE — Telephone Encounter (Signed)
" ° °  Name: Valerie Duran  DOB: [birth year]  MRN: [id number]  Primary Cardiologist: Evalene Lunger, MD  Chart reviewed as part of pre-operative protocol coverage. The patient has an upcoming visit scheduled with Elvie Needle, NP on 07/13/2024 at which time clearance can be addressed in case there are any issues that would impact surgical recommendations.  Right knees scope and synovectomy is not scheduled until 08/29/2024 as below. I added preop FYI to appointment note so that provider is aware to address at time of outpatient visit.  Per office protocol the cardiology provider should forward their finalized clearance decision and recommendations regarding antiplatelet therapy to the requesting party below.    This message will also be routed to pharmacy pool for input on holding Pradaxa  as requested below so that this information is available to the clearing provider at time of patient's appointment.   I will route this message as FYI to requesting party and remove this message from the preop box as separate preop APP input not needed at this time.   Please call with any questions.  Lum LITTIE Louis, NP  07/11/2024, 11:18 AM   "

## 2024-07-12 NOTE — Progress Notes (Unsigned)
 "     Electrophysiology Clinic Note    Date:  07/13/2024  Patient ID:  Valerie Duran, Valerie Duran 11/07/1945, MRN [id number] PCP:  Diedra Lame, MD  Cardiologist:  Evalene Lunger, MD  Electrophysiology APP:  Nickoli Bagheri, NP    Discussed the use of AI scribe software for clinical note transcription with the patient, who gave verbal consent to proceed.   Patient Profile    Chief Complaint: New AFib, ER follow-up  History of Present Illness: Valerie Duran is a 79 y.o. female with PMH notable for parox AFib, T2DM, depression, restless leg; seen today for None for post hospital follow up.   She presented to ER 12/27 after episode of lightheadedness and fall which resulted in a left wrist fracture.  She was noted to be in A-fib with RVR on presentation and was started on IV diltiazem  where she converted to sinus rhythm.  Echo performed showed normal LVEF without any significant valvular abnormalities.  She was discharged with a 2-week monitor, meds at discharge include 150 mg Pradaxa  twice daily (eliquis  cost prohibitive) along with 12.5 mg toprol .  She was noted to be intermittently dizzy while hospitalized in normal rhythm.   On follow-up today, she has no cardiac awareness of her atrial fibrillation.  She continues to take Pradaxa  twice a day without bleeding concerns.  For the past couple of months she has had recurrent dizziness and lightheadedness with near syncope and visual spots including twice during today's visit.  Both times she had emesis.  She has not identified a trigger to these episodes, she recalls that she did not have any of them while wearing her recent ambulatory monitor.  Further, she notes that she has had significant unintentional weight loss with nausea that limits her oral intake to very small amounts.  She is scheduled for repeat knee surgery in about 2 months and requests clearance for that.    Arrhythmia/Device History No specialty comments  available.    ROS:  Please see the history of present illness. All other systems are reviewed and otherwise negative.    Physical Exam    VS:  Ht 5' 7.5 (1.715 m)   Wt 157 lb 6.4 oz (71.4 kg)   SpO2 96%   BMI 24.29 kg/m  BMI: Body mass index is 24.29 kg/m.  Orthostatic VS for the past 24 hrs (Last 3 readings):  BP- Lying Pulse- Lying BP- Sitting Pulse- Sitting BP- Standing at 0 minutes Pulse- Standing at 0 minutes BP- Standing at 3 minutes Pulse- Standing at 3 minutes  07/13/24 0940 117/75 80 125/74 80 111/69 89 112/74 85          Wt Readings from Last 3 Encounters:  07/13/24 157 lb 6.4 oz (71.4 kg)  06/17/24 157 lb 3 oz (71.3 kg)  07/15/23 162 lb 6.4 oz (73.7 kg)     GEN- The patient is ill- appearing, emesis x 2 during visit with dizziness and visual changes Lungs- Clear to ausculation bilaterally, normal work of breathing.  Heart- Regular rate and rhythm, 3/6 murmurs, rubs or gallops Extremities- Trace peripheral edema, warm, dry   Studies Reviewed   Previous EP, cardiology notes.    EKG is ordered. Personal review of EKG from today shows:    EKG Interpretation Date/Time:  Thursday July 13 2024 09:32:37 EST Ventricular Rate:  80 PR Interval:  224 QRS Duration:  96 QT Interval:  376 QTC Calculation: 433 R Axis:   -42  Text Interpretation: Sinus rhythm with  1st degree A-V block Left axis deviation Low voltage QRS Confirmed by Deepa Barthel [phone]) on 07/13/2024 9:40:11 AM    Long term monitor, 07/05/2024 (unofficial results) Patient had a min HR of 49 bpm, max HR of 171 bpm, and avg HR of 78 bpm. Predominant underlying rhythm was Sinus Rhythm. First Degree AV Block was present. 19 Supraventricular Tachycardia runs occurred, the run with the fastest interval lasting 4 beats with a max rate of 162 bpm, the longest lasting 12 beats with an avg rate of 119 bpm. Some episodes of Supraventricular Tachycardia may be possible Atrial Tachycardia with variable block.  Atrial Fibrillation occurred (2% burden), ranging from 83-171 bpm (avg of 123 bpm), the longest lasting 46 mins 12 secs with an avg rate of 116 bpm. Isolated SVEs were rare (<1.0%), SVE Couplets were rare (<1.0%), and SVE Triplets were rare (<1.0%). Isolated VEs were occasional (1.5%, 7201), VE Couplets were rare (<1.0%, 3), and no VE Triplets were present. Ventricular Bigeminy and Trigeminy were present. Previously notified: MD notification criteria for First Documentation of Atrial Fibrillation met - report posted prior to notification (FP).  TTE, 06/18/2024  1. Left ventricular ejection fraction, by estimation, is 60 to 65%. The left ventricle has normal function. The left ventricle has no regional wall motion abnormalities. Left ventricular diastolic parameters were normal. The average left ventricular global longitudinal strain is -19.6 %. The global longitudinal strain is normal.   2. Right ventricular systolic function is normal. The right ventricular size is normal.   3. The mitral valve is normal in structure. Mild mitral valve regurgitation.   4. The aortic valve is tricuspid. Aortic valve regurgitation is not visualized.   5. The inferior vena cava is dilated in size with <50% respiratory variability, suggesting right atrial pressure of 15 mmHg.   Myocardial Spect, 03/04/2018 Pharmacological myocardial perfusion imaging study with no significant  ischemia Small region of mild fixed perfusion defect in the distal inferolateral and apical inferolateral wall, unable to exclude attenuation artifact Minimal defect noted on non-attenuation corrected images Normal wall motion, EF estimated at 68% No EKG changes concerning for ischemia at peak stress or in recovery. Low risk scan  Assessment and Plan     #) parox AFib Recently diagnosed after presenting to ER after fall Converted to sinus on IV dilt, prescribed toprol  at discharge Recent ambulatory monitor with 2% AF burden, did not  correlate with symptom activation Continue 12.5mg  toprol  daily  #) Hypercoag d/t parox afib CHA2DS2-VASc Score = at least 4 [CHF History: 0, HTN History: 0, Diabetes History: 1, Stroke History: 0, Vascular Disease History: 0, Age Score: 2, Gender Score: 1].  Therefore, the patient's annual risk of stroke is 4.8 %.    Stroke ppx - 150mg  pradaxa  BID, appropriately dosed No bleeding concerns though do not favor pradaxa  given patient's older age Will reach out to pharamcy to inquire about assistance with eliquis  5mg  BID.   #) Dizziness #) nausea #) emesis #) presyncope Episodes possibly related to atrial fibrillation or other conditions. Differential includes heart rhythm changes, gastrointestinal issues, or systemic causes. No clear etiology identified. - Referred to primary care for further evaluation. - Advised emergency room visit if symptoms recur with vomiting. - Ordered a two-week heart monitor to assess for heart rhythm changes during episodes. - Advised to hit the monitor button during episodes of dizziness.  #) pre-surgical evaluation Given patient's recurrent presyncopal events in office, I am not able to evaluate her for clearance today Update 2  week zio as above Will re-assess at follow-up   Current medicines are reviewed at length with the patient today.   The patient does not have concerns regarding her medicines.  The following changes were made today:  none  Labs/ tests ordered today include:  Orders Placed This Encounter  Procedures   LONG TERM MONITOR-LIVE TELEMETRY (3-14 DAYS)   EKG 12-Lead     Disposition: Follow up with Dr. Kennyth or EP APP in 4 weeks   Signed, Tayveon Lombardo, NP  07/13/24  4:18 PM  Electrophysiology [facility] "

## 2024-07-13 ENCOUNTER — Telehealth: Payer: Self-pay | Admitting: Pharmacy Technician

## 2024-07-13 ENCOUNTER — Ambulatory Visit

## 2024-07-13 ENCOUNTER — Encounter: Payer: Self-pay | Admitting: Hematology and Oncology

## 2024-07-13 ENCOUNTER — Ambulatory Visit: Admitting: Cardiology

## 2024-07-13 ENCOUNTER — Other Ambulatory Visit (HOSPITAL_COMMUNITY): Payer: Self-pay

## 2024-07-13 ENCOUNTER — Other Ambulatory Visit: Payer: Self-pay

## 2024-07-13 ENCOUNTER — Encounter: Payer: Self-pay | Admitting: Cardiology

## 2024-07-13 VITALS — Ht 67.5 in | Wt 157.4 lb

## 2024-07-13 DIAGNOSIS — D6869 Other thrombophilia: Secondary | ICD-10-CM | POA: Diagnosis present

## 2024-07-13 DIAGNOSIS — I480 Paroxysmal atrial fibrillation: Secondary | ICD-10-CM | POA: Insufficient documentation

## 2024-07-13 MED ORDER — APIXABAN 5 MG PO TABS: 5.0000 mg | ORAL_TABLET | Freq: Two times a day (BID) | ORAL | 3 refills | Status: DC

## 2024-07-13 MED ORDER — RIVAROXABAN 20 MG PO TABS: 20.0000 mg | ORAL_TABLET | Freq: Every day | ORAL | 3 refills | Status: AC

## 2024-07-13 NOTE — Patient Instructions (Signed)
 Your physician recommends the following medication changes.  STOP TAKING: Pradaxa  when you start eliquis .  START TAKING: Eliquis  5 mg twice a day     *If you need a refill on your cardiac medications before your next appointment, please call your pharmacy*  Lab Work: No labs ordered today    Testing/Procedures: ZIO AT Long term monitor-Live Telemetry  Your physician has requested you wear a ZIO patch monitor for 14 days.  This is a single patch monitor. [employer] supplies one patch monitor per enrollment.  Additional stickers are not available.  Please do not apply patch if you will be having a Nuclear Stress Test, Echocardiogram, Cardiac CT, MRI, or Chest Xray during the period you would be wearing the monitor.  The patch cannot be worn during these tests.  You cannot remove and re-apply the ZIO AT patch monitor.  Your ZIO patch monitor will be mailed 3 day USPS to your address on file. It may take 3-5 days to receive your monitor after you have been enrolled.  Once you have received your monitor, please review the enclosed instructions. Your monitor has already been registered assigning a specific monitor serial number to you.   Billing and Patient Assistance Program information  M[place] has been supplied with any insurance information on record for billing.  [employer] offers a sliding scale Patient Assistance Program for patients without insurance, or whose insurance does not completely cover the cost of the ZIO patch monitor.  You must apply for the Patient Assistance Program to qualify for the discounted rate.  To apply, call [employer] at [phone], select option 4, select option 2 , ask to apply for the Patient Assistance Program, (you can request an interpreter if needed).  [employer] will ask your household income and how many people are in your household.  [employer] will quote your out-of-pocket cost based on this information. They will also be able to set up a 12 month interest free  payment plan if needed.  Applying the monitor   Shave hair from upper left chest.  Hold the abrader disc by orange tab. Rub the abrader in 40 strokes over left upper chest as indicated in your monitor instructions.  Clean area with 4 enclosed alcohol  pads. Use all pads to ensure the area is cleaned thoroughly. Let dry.  Apply patch as indicated in monitor instructions. Patch will be placed under collarbone on left side of chest with arrow pointing upward.  Rub patch adhesive wings for 2 minutes. Remove the white label marked 1. Remove the white label marked 2. Rub patch adhesive wings for 2 additional minutes.  While looking in a mirror, press and release button in center of patch. A small green light will flash 3-4 times.  This will be your only indicator that the monitor has been turned on.   Do not shower for the first 24 hours.  You may shower after the first 24 hours.  Press the button if you feel a symptom.  You will hear a small click.  Record Date, Time and Symptom in the Patient Log.   Starting the Gateway  In your kit there is a audiological scientist box the size of a cellphone.  This is [employer]. It transmits all your recorded data to [facility].  This box must always stay within 10 feet of you.  Open the box and push the * button.  There will be a light that blinks orange and then green a few times.  When the light stops  blinking, the Gateway is connected to the ZIO patch. Call [employer] at [phone] to confirm your monitor is transmitting.  Returning your monitor  Remove your patch and place it inside the Gateway.  In the lower half of the Gateway box there is a white bag with prepaid postage on it.  Place Gateway in bag and seal.  Mail package back to [place] as soon as possible.  Your physician should have your final report approximately 7-14 days after you have mailed back your monitor. Call [facility] Customer Care at [phone] if you have questions regarding your  ZIO AT patch monitor.   Call them immediately if you see an orange light blinking on your monitor.  If your monitor falls off in less than 4 days, contact our Monitor department at [phone]. If your monitor becomes loose or falls off after 4 days call [employer] at [phone] for suggestions on securing your monitor.   Follow-Up: At [facility], you and your health needs are our priority.  As part of our continuing mission to provide you with exceptional heart care, our providers are all part of one team.  This team includes your primary Cardiologist (physician) and Advanced Practice Providers or APPs (Physician Assistants and Nurse Practitioners) who all work together to provide you with the care you need, when you need it.  Your next appointment:   1 month(s)  Provider:   Suzann Riddle, NP

## 2024-07-13 NOTE — Telephone Encounter (Signed)
" °  [employer] requires the patient to pay 3% of their income in oop expenses in 2026 before they will provide assistance   She doesn't have cardiomyopathy  Patient has a 499.40 deductible. 249.70 for 1 month is her charge for eliquis   Xarelto  is 207.43 for 30 days   Dabigatran  is 89.75 for 30 days   Sent message to see if she can be changed to xarelto   Provider portion scanned in media waiting for signature  Application mailed to patient  "

## 2024-07-14 ENCOUNTER — Telehealth: Payer: Self-pay

## 2024-07-14 NOTE — Telephone Encounter (Signed)
 I spoke with Valerie Duran with an update on medication pricing. Patient was able to fill Eliquis  at a cost of $250.00 for a 30 day supply. Patient will switch to Xarelto  after finishing the eliquis . Pricing should be $208.00 a month. Patient assistance paperwork submitted to [employer] by the Pharmacy Team. Patient verbalized the plan to switch from Pradaxa  to eliquis  and then to Xarelto . [name] verbalized understanding and all questions answered at this time. Suzann Riddle, NP notified.

## 2024-07-14 NOTE — Telephone Encounter (Signed)
 Patient assistance Provider form for Valerie Duran for Xarelto  faxed to [employer] at [phone]. Original and fax confirmation sent to be imaged for patient chart.

## 2024-07-14 NOTE — Telephone Encounter (Signed)
 Received provider portion and scanned in media for xarelto   Mailed xarelto  application 07/13/24

## 2024-07-17 DIAGNOSIS — R55 Syncope and collapse: Secondary | ICD-10-CM | POA: Diagnosis not present

## 2024-07-17 DIAGNOSIS — I480 Paroxysmal atrial fibrillation: Secondary | ICD-10-CM | POA: Diagnosis not present

## 2024-07-17 DIAGNOSIS — G5600 Carpal tunnel syndrome, unspecified upper limb: Secondary | ICD-10-CM | POA: Insufficient documentation

## 2024-07-17 DIAGNOSIS — M2550 Pain in unspecified joint: Secondary | ICD-10-CM | POA: Insufficient documentation

## 2024-07-17 NOTE — Telephone Encounter (Signed)
 Patient with diagnosis of A fib on Xarelto  for anticoagulation.    Procedure: Right Knee Scope and Synovectomy  Date of procedure: 08/29/24   CHA2DS2-VASc Score = 4  This indicates a 4.8% annual risk of stroke. The patient's score is based upon: CHF History: 0 HTN History: 0 Diabetes History: 1 Stroke History: 0 Vascular Disease History: 0 Age Score: 2 Gender Score: 1   CrCl 101 ml/min Platelet count 253k  Patient has not had an Afib/aflutter ablation in the last 3 months, DCCV within the last 4 weeks or a watchman implanted in the last 45 days    Per office protocol, patient can hold Xarelto  for 2 days prior to procedure.    **This guidance is not considered finalized until pre-operative APP has relayed final recommendations.**

## 2024-07-18 ENCOUNTER — Ambulatory Visit: Payer: Self-pay | Admitting: Nurse Practitioner

## 2024-07-27 ENCOUNTER — Other Ambulatory Visit (HOSPITAL_COMMUNITY): Payer: Self-pay

## 2024-07-27 NOTE — Telephone Encounter (Signed)
 Hi, the patient is asking if someone can call her and let her know if she still needs to be taking the eliquis /xarelto . She said she has never been in a-fib until now so she is wondering if she can stop it. Thank you!   Xarelto  is still 207 even after she got the eliquis  on 07/13/24 and paid 249 for the eliquis . Still 157 left on deductible after if she got the xarelto .    Faxed j&j application for xarelto 

## 2024-07-27 NOTE — Telephone Encounter (Signed)
 Called patient to advise of Suzann Riddle's note - patient verbalized understanding and agreement with plan, states she is very thankful for all of the assistance she is getting in regards to her medications and their costs

## 2024-08-15 ENCOUNTER — Ambulatory Visit: Admitting: Cardiology
